# Patient Record
Sex: Male | Born: 1960 | Race: White | Hispanic: No | Marital: Married | Smoking: Never smoker
Health system: Southern US, Community
[De-identification: ages and names within clinical notes are randomized; demographics above are authoritative.]

## PROBLEM LIST (undated history)

## (undated) DIAGNOSIS — M1990 Unspecified osteoarthritis, unspecified site: Secondary | ICD-10-CM

## (undated) DIAGNOSIS — R011 Cardiac murmur, unspecified: Secondary | ICD-10-CM

## (undated) DIAGNOSIS — E785 Hyperlipidemia, unspecified: Secondary | ICD-10-CM

## (undated) DIAGNOSIS — K219 Gastro-esophageal reflux disease without esophagitis: Secondary | ICD-10-CM

## (undated) DIAGNOSIS — I10 Essential (primary) hypertension: Secondary | ICD-10-CM

## (undated) DIAGNOSIS — I509 Heart failure, unspecified: Secondary | ICD-10-CM

## (undated) DIAGNOSIS — I6521 Occlusion and stenosis of right carotid artery: Secondary | ICD-10-CM

## (undated) DIAGNOSIS — N529 Male erectile dysfunction, unspecified: Secondary | ICD-10-CM

## (undated) DIAGNOSIS — I421 Obstructive hypertrophic cardiomyopathy: Secondary | ICD-10-CM

## (undated) HISTORY — DX: Male erectile dysfunction, unspecified: N52.9

## (undated) HISTORY — DX: Hyperlipidemia, unspecified: E78.5

## (undated) HISTORY — DX: Gastro-esophageal reflux disease without esophagitis: K21.9

## (undated) HISTORY — DX: Obstructive hypertrophic cardiomyopathy: I42.1

## (undated) HISTORY — DX: Essential (primary) hypertension: I10

---

## 2006-04-13 ENCOUNTER — Inpatient Hospital Stay (HOSPITAL_COMMUNITY): Admission: AD | Admit: 2006-04-13 | Discharge: 2006-04-14 | Payer: Self-pay | Admitting: Internal Medicine

## 2006-04-13 ENCOUNTER — Ambulatory Visit: Payer: Self-pay | Admitting: *Deleted

## 2006-04-14 ENCOUNTER — Encounter: Payer: Self-pay | Admitting: Internal Medicine

## 2006-04-14 HISTORY — PX: CARDIAC CATHETERIZATION: SHX172

## 2006-04-17 ENCOUNTER — Ambulatory Visit: Payer: Self-pay | Admitting: Internal Medicine

## 2006-04-22 HISTORY — PX: MYOMECTOMY: SHX85

## 2006-04-23 ENCOUNTER — Ambulatory Visit (HOSPITAL_COMMUNITY): Admission: RE | Admit: 2006-04-23 | Discharge: 2006-04-23 | Payer: Self-pay | Admitting: Internal Medicine

## 2006-04-23 ENCOUNTER — Ambulatory Visit: Payer: Self-pay | Admitting: Internal Medicine

## 2006-05-02 ENCOUNTER — Ambulatory Visit: Payer: Self-pay | Admitting: Internal Medicine

## 2006-05-15 ENCOUNTER — Ambulatory Visit: Payer: Self-pay | Admitting: Internal Medicine

## 2006-05-29 ENCOUNTER — Ambulatory Visit: Payer: Self-pay | Admitting: Cardiology

## 2006-06-02 ENCOUNTER — Ambulatory Visit: Payer: Self-pay | Admitting: Cardiology

## 2006-06-10 ENCOUNTER — Ambulatory Visit: Payer: Self-pay | Admitting: Cardiology

## 2006-06-13 ENCOUNTER — Ambulatory Visit: Payer: Self-pay | Admitting: Internal Medicine

## 2006-06-13 LAB — CONVERTED CEMR LAB
Calcium: 8.8 mg/dL (ref 8.4–10.5)
GFR calc Af Amer: 104 mL/min
GFR calc non Af Amer: 86 mL/min
Glucose, Bld: 105 mg/dL — ABNORMAL HIGH (ref 70–99)
Sodium: 144 meq/L (ref 135–145)

## 2006-06-18 ENCOUNTER — Ambulatory Visit: Payer: Self-pay | Admitting: Internal Medicine

## 2006-07-04 ENCOUNTER — Ambulatory Visit: Payer: Self-pay | Admitting: Internal Medicine

## 2006-07-28 ENCOUNTER — Encounter: Payer: Self-pay | Admitting: Cardiovascular Disease

## 2006-07-28 ENCOUNTER — Ambulatory Visit: Payer: Self-pay

## 2006-10-10 ENCOUNTER — Ambulatory Visit: Payer: Self-pay | Admitting: Internal Medicine

## 2007-04-07 ENCOUNTER — Ambulatory Visit: Payer: Self-pay | Admitting: Internal Medicine

## 2008-03-18 ENCOUNTER — Ambulatory Visit: Payer: Self-pay | Admitting: Internal Medicine

## 2008-04-20 ENCOUNTER — Ambulatory Visit: Payer: Self-pay

## 2008-04-20 ENCOUNTER — Encounter: Payer: Self-pay | Admitting: Internal Medicine

## 2008-10-17 ENCOUNTER — Telehealth: Payer: Self-pay | Admitting: Internal Medicine

## 2009-05-29 ENCOUNTER — Ambulatory Visit: Payer: Self-pay | Admitting: Cardiology

## 2009-05-29 ENCOUNTER — Encounter: Payer: Self-pay | Admitting: Cardiology

## 2009-05-30 ENCOUNTER — Encounter: Payer: Self-pay | Admitting: Internal Medicine

## 2009-05-30 ENCOUNTER — Encounter: Payer: Self-pay | Admitting: Cardiology

## 2009-05-31 ENCOUNTER — Telehealth (INDEPENDENT_AMBULATORY_CARE_PROVIDER_SITE_OTHER): Payer: Self-pay | Admitting: *Deleted

## 2009-06-21 DIAGNOSIS — K219 Gastro-esophageal reflux disease without esophagitis: Secondary | ICD-10-CM

## 2009-06-21 DIAGNOSIS — I10 Essential (primary) hypertension: Secondary | ICD-10-CM | POA: Insufficient documentation

## 2009-06-21 DIAGNOSIS — E785 Hyperlipidemia, unspecified: Secondary | ICD-10-CM

## 2009-06-22 ENCOUNTER — Ambulatory Visit: Payer: Self-pay | Admitting: Internal Medicine

## 2009-06-23 ENCOUNTER — Encounter: Payer: Self-pay | Admitting: Cardiology

## 2009-07-05 ENCOUNTER — Telehealth: Payer: Self-pay | Admitting: Internal Medicine

## 2010-02-21 ENCOUNTER — Ambulatory Visit: Payer: Self-pay | Admitting: Internal Medicine

## 2010-02-21 ENCOUNTER — Encounter: Payer: Self-pay | Admitting: Internal Medicine

## 2010-02-21 DIAGNOSIS — R5383 Other fatigue: Secondary | ICD-10-CM

## 2010-02-21 DIAGNOSIS — R5381 Other malaise: Secondary | ICD-10-CM

## 2010-02-23 ENCOUNTER — Telehealth: Payer: Self-pay | Admitting: Internal Medicine

## 2010-05-22 NOTE — Letter (Signed)
Summary: [facility]   Imported By: Roderic Ovens 06/19/2009 14:47:29  _____________________________________________________________________  External Attachment:    Type:   Image     Comment:   External Document

## 2010-05-22 NOTE — Progress Notes (Signed)
Summary: test results  Phone Note Call from Patient Call back at Home Phone [phone] Call back at [phone]   Caller: Patient Reason for Call: Lab or Test Results Initial call taken by: Judie Grieve,  February 23, 2010 1:28 PM  Follow-up for Phone Call        have not received lab results yet, pt states he had them drawn on Thur am advised it takes time for results to come back and then they have to fax them to [place], will call him when they come in [facility], RN  February 23, 2010 1:34 PM

## 2010-05-22 NOTE — Progress Notes (Signed)
Summary: Post Hosp Follow up appt  ---- Converted from flag ---- ---- 05/30/2009 4:13 PM, Loreli Slot, MD, [facility] wrote: Could you please help to schedule a visit for [name] [name] 01/20/1961 in the [place] office with Dr. Gala Romney over next few weeks.  We saw him in consult at [facility].  I sent a flag to [facility].  [place] office can call patient with visit.  Thanks. ------------------------------  Pt has been scheduled with Dr. Gala Romney on Thurs 3/3 at 3:45. Per Marcelino Duster in [place], she will notify pt of appt.

## 2010-05-22 NOTE — Progress Notes (Signed)
Summary: want lab results  Phone Note Call from Patient Call back at [phone]   Caller: Patient Reason for Call: Talk to Nurse, Talk to Doctor, Lab or Test Results Summary of Call: pt got lab work done and he would like results Initial call taken by: Omer Jack,  July 05, 2009 12:50 PM  Follow-up for Phone Call        gave pt lab results, he is aware Dr Gala Romney has not reviewed, he states he has not been taking his gemfibrizil he will restart, will call back if further recommendations Meredith Staggers, RN  July 05, 2009 3:47 PM

## 2010-05-22 NOTE — Assessment & Plan Note (Signed)
Summary: f69m   Visit Type:  6 mo f/u Primary Provider:  NO PCP AT THIS TIME  CC:  pt offers no cardiac complaints today.  History of Present Illness: [name] [name] is a very pleasant 50 year old male with history of hypertrophic obstructive cardiomyopathy status post septal myomectomy in January 2008 at [facility].  Followup echocardiogram shows an EF of 60% with a mild residual gradient of 2-3 meters per second across his LV outflow tract.  This has been stable.   He also has a history of mild mitral regurgitation, hypertension, hyperlipidemia with a low HDL.   Admitted to [facility] with CP in February 2011. Troponin equivocal at 0.06. Underwent Lexiscan/Myoview which showed:  Equivocal LV perfusion. Limitations and artifact were due to diaphragm or other soft tissue. There was a small, reversible defect in the mid inferolateral, apical lateral segment(s). Potentially related to variable soft tissue attenuation - less likely focal ischemia. The patient's calculated post stress LVEF was 52% (EF is underestimated)  TID Ratio 1.24 - could be function of subendocardial ischemia in setting of severe LVH.   Also had echo EF 65%with severe LVH no significant LVOT gradient   Back to work. No recurrent CP or dyspnea. No syncope. BP ok. Only real compliant is he remains tired. Working 70 hours a week and doesn't have the stamina he used to have. Does not have PCP - no blood tests recently. Having problems with intermittent erectile dysfunction.   Current Medications (verified): 1)  Gemfibrozil 600 Mg Tabs (Gemfibrozil) .... Take 1 Tablet By Mouth Twice A Day 2)  Hydrochlorothiazide 25 Mg Tabs (Hydrochlorothiazide) .... Take 1 Tablet By Mouth Once A Day 3)  Lisinopril 40 Mg Tabs (Lisinopril) .... Take 1 Tablet By Mouth Once A Day 4)  Metoprolol Succinate 100 Mg Xr24h-Tab (Metoprolol Succinate) .... Take 1 Tablet By Mouth Twice A Day 5)  Aspirin 81 Mg Tbec (Aspirin) .... Take One Tablet By Mouth Daily 6)   Omega-3 Krill Oil 300 Mg Caps (Krill Oil) .... 3 Caps Once Daily  Allergies (verified): No Known Drug Allergies  Past History:  Past Medical History: 1. CARDIOMYOPATHY, HYPERTROPHIC, OBSTRUCTIVE      --cardiac cath 2007. nomrmal cors 2. HYPERLIPIDEMIA   3. HYPERTENSION  4. GERD 5. Erectile dysfunction 6. Rheumatic fever as a child. 7. Chest pain   -Lexiscan Myoview 2/11:  Equivocal LV perfusion. Limitations and artifact were due to diaphragm or other soft tissue. There was a small, reversible defect in the mid inferolateral, apical lateral segment(s). Potentially related to variable soft tissue attenuation - less likely focal ischemia. The patient's calculated post stress LVEF was 52%. TID Ratio 1.24 - could be function of subendocardial ischemia in setting of severe LVH.   Review of Systems       As per HPI and past medical history; otherwise all systems negative.   Vital Signs:  Patient profile:   50 year old male Height:      63 inches Weight:      196 pounds BMI:     34.85 Pulse rate:   66 / minute Pulse rhythm:   irregular BP sitting:   112 / 75  (left arm) Cuff size:   large  Vitals Entered By: Danielle Rankin, CMA (February 21, 2010 11:39 AM)  Physical Exam  General:  Well appearing. no resp difficulty HEENT: normal Neck: supple. no JVD. Carotids 2+ bilat; no bruits. No lymphadenopathy or thryomegaly appreciated. Cor: PMI nondisplaced. Regular rate & rhythm. No rubs, gallops,  murmur. Lungs: clear Abdomen: soft, nontender, nondistended. No hepatosplenomegaly. No bruits or masses. Good bowel sounds. Extremities: no cyanosis, clubbing, rash, edema Neuro: alert & orientedx3, cranial nerves grossly intact. moves all 4 extremities w/o difficulty. affect pleasant    Impression & Recommendations:  Problem # 1:  CARDIOMYOPATHY, HYPERTROPHIC, OBSTRUCTIVE (ICD-425.1) Doing well. Stable. No LVOT gradient s/p myectomy.  Problem # 2:  HYPERLIPIDEMIA (ICD-272.4) Due for  lipid check. Encouraged him to get PCP.   Problem # 3:  HYPERTENSION (ICD-401.9) Blood pressure well controlled. Continue current regimen.  Problem # 4:  Erectile dysfunction Will continue as needed sildenafil.   Problem # 5:  FATIGUE / MALAISE (ICD-780.79) Likely due to amount of work he is doing and aging proces. Will check routine labs.   CHF Assessment/Plan:      The patient's current weight is 196 pounds.  His previous weight was 194 pounds.     Patient Instructions: 1)  Labs tom. 2)  Your physician wants you to follow-up in: 1 year.   You will receive a reminder letter in the mail two months in advance. If you don't receive a letter, please call our office to schedule the follow-up appointment. Prescriptions: VIAGRA 100 MG TABS (SILDENAFIL CITRATE) as needed  #10 x 6   Entered by:   Meredith Staggers, RN   Authorized by:   Dolores Patty, MD, [facility]   Signed by:   Meredith Staggers, RN on 02/21/2010   Method used:   Electronically to        [id number] * (retail)       [address]       [place], [place]  [address]       Ph: [phone]       Fax: [phone]   RxID:   [id number]

## 2010-05-22 NOTE — Assessment & Plan Note (Signed)
Summary: EPH   Visit Type:  Follow-up Primary Provider:  none  CC:  eph.  History of Present Illness: [name] [name] is a very pleasant 50 year old male with history of hypertrophic obstructive cardiomyopathy status post septal myomectomy in January 2008 at [facility].  Followup echocardiogram shows an EF of 60% with a mild residual gradient of 2-3 meters per second across his LV outflow tract.  This has been stable.   He also has a history of mild mitral regurgitation, hypertension, hyperlipidemia with a low HDL.   Last month was admitted to [facility] with CP. Troponin equivocal at 0.06. Underwent Lexiscan/Myoview which showed:  Equivocal LV perfusion. Limitations and artifact were due to diaphragm or other soft tissue. There was a small, reversible defect in the mid inferolateral, apical lateral segment(s). Potentially related to variable soft tissue attenuation - less likely focal ischemia. The patient's calculated post stress LVEF was 52% (EF is underestimated)  TID Ratio 1.24 - could be function of subendocardial ischemia in setting of severe LVH.   Also had echo ef 65%with severe LVH no significant LVOT gradient   Back to work. No recurrent CP or dyspnea. No syncope. BP ok.   Current Medications (verified): 1)  Gemfibrozil 600 Mg Tabs (Gemfibrozil) .... Take 1 Tablet By Mouth Twice A Day 2)  Hydrochlorothiazide 25 Mg Tabs (Hydrochlorothiazide) .... Take 1 Tablet By Mouth Once A Day 3)  Lisinopril 40 Mg Tabs (Lisinopril) .... Take 1 Tablet By Mouth Once A Day 4)  Metoprolol Succinate 100 Mg Xr24h-Tab (Metoprolol Succinate) .... Take 1 Tablet By Mouth Twice A Day 5)  Aspirin 81 Mg Tbec (Aspirin) .... Take One Tablet By Mouth Daily 6)  Fish Oil   Oil (Fish Oil) .... 1000mg  2 Caps Once Daily  Allergies (verified): No Known Drug Allergies  Past History:  Past Medical History: 1. CARDIOMYOPATHY, HYPERTROPHIC, OBSTRUCTIVE   2. HYPERLIPIDEMIA   3. HYPERTENSION  4. GERD 5. Erectile  dysfunction 6. Rheumatic fever as a child. 7. Chest pain   -Lexiscan Myoview 2/11:  Equivocal LV perfusion. Limitations and artifact were due to diaphragm or other soft tissue. There was a small, reversible defect in the mid inferolateral, apical lateral segment(s). Potentially related to variable soft tissue attenuation - less likely focal ischemia. The patient's calculated post stress LVEF was 52%. TID Ratio 1.24 - could be function of subendocardial ischemia in setting of severe LVH.   Vital Signs:  Patient profile:   50 year old male Height:      63 inches Weight:      194 pounds BMI:     34.49 Pulse rate:   61 / minute BP sitting:   120 / 72  (left arm) Cuff size:   regular  Vitals Entered By: Hardin Negus, RMA (June 22, 2009 3:51 PM)   Impression & Recommendations:  Problem # 1:  CHEST TIGHTNESS-PRESSURE-OTHER ([id number]) Resolved. Myoview reassuring. No further cardiac work-up at this time.   Problem # 2:  CARDIOMYOPATHY, HYPERTROPHIC, OBSTRUCTIVE (ICD-425.1) Stable. No LVOT gradient s/p myectomy.  Problem # 3:  HYPERLIPIDEMIA (ICD-272.4) Due for lipid and liver recheck. Continue current medications.  Other Orders: EKG w/ Interpretation (93000)  Appended Document: EPH    Clinical Lists Changes  Medications: Rx of HYDROCHLOROTHIAZIDE 25 MG TABS (HYDROCHLOROTHIAZIDE) Take 1 tablet by mouth once a day;  #90 x 3;  Signed;  Entered by: Meredith Staggers, RN;  Authorized by: Dolores Patty, MD, [facility];  Method used: Electronically to [facility]*, , ,   ,  Ph: [phone], Fax: [phone] Rx of LISINOPRIL 40 MG TABS (LISINOPRIL) Take 1 tablet by mouth once a day;  #90 x 3;  Signed;  Entered by: Meredith Staggers, RN;  Authorized by: Dolores Patty, MD, [facility];  Method used: Electronically to [facility]*, , ,   , Ph: [phone], Fax: [phone] Rx of METOPROLOL SUCCINATE 100 MG XR24H-TAB (METOPROLOL SUCCINATE) Take 1 tablet by mouth twice a day;  #180 x 3;  Signed;   Entered by: Meredith Staggers, RN;  Authorized by: Dolores Patty, MD, [facility];  Method used: Electronically to [facility] [place]*, , ,   , Ph: [phone], Fax: [phone]    Prescriptions: METOPROLOL SUCCINATE 100 MG XR24H-TAB (METOPROLOL SUCCINATE) Take 1 tablet by mouth twice a day  #180 x 3   Entered by:   Meredith Staggers, RN   Authorized by:   Dolores Patty, MD, [facility]   Signed by:   Meredith Staggers, RN on 06/22/2009   Method used:   Electronically to        [facility]* (mail-order)             ,          Ph: [phone]       Fax: [phone]   RxID:   [id number] LISINOPRIL 40 MG TABS (LISINOPRIL) Take 1 tablet by mouth once a day  #90 x 3   Entered by:   Meredith Staggers, RN   Authorized by:   Dolores Patty, MD, [facility]   Signed by:   Meredith Staggers, RN on 06/22/2009   Method used:   Electronically to        [facility]* (mail-order)             ,          Ph: [phone]       Fax: [phone]   RxID:   [id number] HYDROCHLOROTHIAZIDE 25 MG TABS (HYDROCHLOROTHIAZIDE) Take 1 tablet by mouth once a day  #90 x 3   Entered by:   Meredith Staggers, RN   Authorized by:   Dolores Patty, MD, [facility]   Signed by:   Meredith Staggers, RN on 06/22/2009   Method used:   Electronically to        [facility]* (mail-order)             ,          Ph: [phone]       Fax: [phone]   RxID:   [id number]

## 2010-05-22 NOTE — Consult Note (Signed)
Summary: Consultation Report/ CARDIOLOGY [facility]  Consultation Report/ CARDIOLOGY [facility]   Imported By: Dorise Hiss 05/31/2009 10:56:59  _____________________________________________________________________  External Attachment:    Type:   Image     Comment:   External Document

## 2010-06-26 ENCOUNTER — Encounter: Payer: Self-pay | Admitting: Physician Assistant

## 2010-06-27 DIAGNOSIS — R7989 Other specified abnormal findings of blood chemistry: Secondary | ICD-10-CM

## 2010-06-27 DIAGNOSIS — R55 Syncope and collapse: Secondary | ICD-10-CM

## 2010-06-27 DIAGNOSIS — R079 Chest pain, unspecified: Secondary | ICD-10-CM

## 2010-07-11 ENCOUNTER — Encounter: Payer: Self-pay | Admitting: Physician Assistant

## 2010-07-18 ENCOUNTER — Encounter: Payer: Self-pay | Admitting: Physician Assistant

## 2010-07-19 ENCOUNTER — Encounter: Payer: Self-pay | Admitting: Physician Assistant

## 2010-07-25 ENCOUNTER — Encounter: Payer: Self-pay | Admitting: Physician Assistant

## 2010-08-01 ENCOUNTER — Ambulatory Visit (INDEPENDENT_AMBULATORY_CARE_PROVIDER_SITE_OTHER): Payer: BC Managed Care – PPO | Admitting: Physician Assistant

## 2010-08-01 ENCOUNTER — Encounter: Payer: Self-pay | Admitting: Physician Assistant

## 2010-08-01 DIAGNOSIS — R55 Syncope and collapse: Secondary | ICD-10-CM

## 2010-08-01 DIAGNOSIS — E785 Hyperlipidemia, unspecified: Secondary | ICD-10-CM

## 2010-08-01 DIAGNOSIS — I421 Obstructive hypertrophic cardiomyopathy: Secondary | ICD-10-CM

## 2010-08-01 NOTE — Progress Notes (Signed)
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 male with a history of hypertrophic obstructive cardiomyopathy status post septal myomectomy in January 2008 at [facility].  Followup echocardiogram shows an EF of 60% with a mild residual gradient of 2-3 meters per second across his LV outflow tract.  He also has a history of mild mitral regurgitation, hypertension, hyperlipidemia with a low HDL. He was admitted to [facility] in early March with syncope and atypical chest discomfort.  He saw one of our cardiologist there.  He had mildly elevated troponins.  This was likely related to strain from his LVH.  His CK-MB's were negative.  He had an echocardiogram that demonstrated moderate to severe LVH and EF of 65%.  His resting LVOT peak velocity was calculated at 2.21 m/s.  A Myoview study was performed.  This demonstrated a small, fixed defect in the apical septal segment.  There was a small, reversible defect in the mid anterolateral, apical lateral segments.  Findings were similar to a prior study in 05/2009.  Mild apical lateral ischemia could not be ruled out and his EF was 53%.  He has done well since that time.  He denies chest pain.  He denies shortness of breath.  He denies further syncope.  He denies orthopnea, PND or pedal edema.  He had a syncopal episode in the context of a viral gastroenteritis.  He had several bouts of nausea vomiting and diarrhea preceding his syncope.  He denies any further episodes of syncope.  Past Medical History  Diagnosis Date  . HOCM (hypertrophic obstructive cardiomyopathy)      --cardiac cath 2007. nomrmal cors;  s/p myomectomy 2008;  Echo 3/12: mod to severe LVH, HCM, EF 65%, resting LVOT peak 2.21 m/s, mild LAE, mild MR, AV mean 9  . Hyperlipidemia   . GERD (gastroesophageal reflux disease)   . Erectile dysfunction   . Chest pain      -Lexiscan Myoview 3/12: ? small apical lateral ischemia, EF 53%; similar  to study in 2/11  . HTN (hypertension)     Current Outpatient Prescriptions  Medication Sig Dispense Refill  . aspirin 81 MG tablet Take 81 mg by mouth daily.        Marland Kitchen gemfibrozil (LOPID) 600 MG tablet Take 600 mg by mouth daily.        . hydrochlorothiazide 25 MG tablet Take 25 mg by mouth daily.        Marland Kitchen lisinopril (PRINIVIL,ZESTRIL) 40 MG tablet Take 40 mg by mouth daily.        . metoprolol (TOPROL-XL) 100 MG 24 hr tablet Take 2 tabs daily      . sildenafil (VIAGRA) 100 MG tablet Take 100 mg by mouth daily as needed.        Marland Kitchen DISCONTD: metoprolol (TOPROL-XL) 100 MG 24 hr tablet Take two tabs daily        No Known Allergies  Vital Signs: BP 140/90  Pulse 50  Ht 5\' 3"  (1.6 m)  Wt 191 lb (86.637 kg)  BMI 33.83 kg/m2  PHYSICAL EXAM: Well nourished, well developed, in no acute distress HEENT: normal Neck: no JVD Cardiac:  normal S1, S2; RRR; no murmur Lungs:  clear to auscultation bilaterally, no wheezing, rhonchi or rales Abd: soft, nontender, no hepatomegaly Ext: no edema Skin: warm and dry Neuro:  CNs 2-12 intact, no focal abnormalities noted  EKG:  Sinus bradycardia, heart rate 50, LVH with strain  ASSESSMENT AND PLAN:

## 2010-08-01 NOTE — Assessment & Plan Note (Signed)
He brings in a copy of his labs recently drawn by his primary care provider.  Total cholesterol is 182, triglycerides 69 HDL 31 and LDL 137.

## 2010-08-01 NOTE — Assessment & Plan Note (Signed)
Somewhat elevated today.  He can followup with his PCP.

## 2010-08-01 NOTE — Assessment & Plan Note (Addendum)
Stable measurements by recent echocardiogram.  His Myoview study was also unchanged.  He likely has subendocardial ischemia from significant strain from his LVH.  He was also seen by Dr. Gala Romney today.  He had normal cardiac catheterization in 2007.  We've planned no further workup at this time.  He can followup with Dr. Gala Romney in 6 months.

## 2010-08-01 NOTE — Patient Instructions (Signed)
Your physician recommends that you schedule a follow-up appointment in: 6 MONTHS WITH DR. Gala Romney

## 2010-08-01 NOTE — Assessment & Plan Note (Signed)
This was related to his illness and dehydration.  With his HOCM, he is prone to syncope Should he become slightly dehydrated.  He has been advised to continue to try to hydrate himself in the setting of any illness.

## 2010-08-21 ENCOUNTER — Encounter: Payer: Self-pay | Admitting: Internal Medicine

## 2010-08-21 ENCOUNTER — Encounter: Payer: Self-pay | Admitting: Physician Assistant

## 2010-09-04 NOTE — Assessment & Plan Note (Signed)
[facility]                            CARDIOLOGY OFFICE NOTE   NAME:[name] [name]                   MRN:          [id number]  DATE:03/18/2008                            DOB:          09-28-1960    INTERVAL HISTORY:  [name] [name] is a very pleasant 50 year old male with  history of hypertrophic obstructive cardiomyopathy status post septal  myomectomy in January 2008 at [facility].  Followup echocardiogram shows an EF  of 60% with a mild residual gradient of 2-3 meters per second across his  LV outflow tract.  This has been stable.   He also has a history of mild mitral regurgitation, hypertension,  hyperlipidemia with a low HDL.   From a functional point of view, he is doing great.  Continues to work  hard with no dyspnea, chest pain or other problems.  Denies any  orthopnea, no PND, no palpitations, no syncope or presyncope.   CURRENT MEDICATIONS:  1. Metoprolol ER 100 b.i.d.  2. Lisinopril 40 a day.  3. Aspirin 81 a day.  4. Hydrochlorothiazide 25 a day.  5. Fish oil.  6. Lopid 600 mg b.i.d.   PHYSICAL EXAMINATION:  GENERAL:  He is well-appearing in no acute  distress, ambulates around the clinic without any respiratory  difficulty.  VITAL SIGNS:  Blood pressure is 124/82, heart rate is 61, and weight is  195.  HEENT:  Normal.  NECK:  Supple.  No JVD.  Carotids are 2+ bilaterally without any bruits.  There is no lymphadenopathy or thyromegaly.  CARDIAC:  PMI is nondisplaced.  Regular rate and rhythm with a 2/6  systolic ejection murmur across the precordium.  I do not detect any  notable increase with Valsalva.  LUNGS:  Clear.  ABDOMEN:  Soft, nontender, nondistended.  No hepatosplenomegaly, no  bruits, no masses.  Good bowel sounds.  EXTREMITIES:  Warm with no  cyanosis, clubbing, or edema.  SKIN:  No rash.  NEURO:  Alert and oriented x3.  Cranial nerves II through XII are  intact.  Moves all 4 extremities without difficulty.  Affect is  pleasant.   EKG shows normal sinus rhythm at a rate of 61 with LVH and diffuse  repolarization abnormalities.   ASSESSMENT AND PLAN:  1. Hypertrophic obstructive cardiomyopathy status post myomectomy.  He      is doing well.  Continue current therapy.  2. Hypertension, well controlled.  3. Hyperlipidemia.  It has been followed by his primary care      physician.  I have asked to get a copy of his lipids.     [name] Buckles. Bensimhon, MD  Electronically Signed    DRB/MedQ  DD: 03/18/2008  DT: 03/18/2008  Job #: [id number]

## 2010-09-04 NOTE — Assessment & Plan Note (Signed)
[facility]                            CARDIOLOGY OFFICE NOTE   NAME:Garlick, DEE MADAY                   MRN:          [id number]  DATE:10/10/2006                            DOB:          24-Jun-1960    INTERVAL HISTORY:  Mr. [name] [name] is a pleasant 50 year old male with a  history of hypertrophic obstructive cardiomyopathy status post septal  myomectomy in January 2008 by Dr. Gasper Lloyd at [facility].  He returns today  for routine followup.   PAST MEDICAL HISTORY:  Also notable for hypertension and hyperlipidemia  with a low HDL.  He returns today with his family for routine followup.   He is doing great.  He is back to work.  He feels like he has more  energy than he has had in a long time.  Denies any chest pain, shortness  of breath, no syncope.  No palpitations.   CURRENT MEDICATIONS:  1. Metoprolol ER 100 mg b.i.d.  2. Lisinopril 20 a day.  3. Aspirin 81 a day.  4. Hydrochlorothiazide 25 a day.  5. Fish oil.   PHYSICAL EXAM:  He is well-appearing.  In no acute distress.  Ambulates  around the clinic without any respiratory difficulty.  Blood pressure 108/66, heart rate 66, weight 188.  HEENT:  Normal.  NECK:  Supple.  No JVD.  Carotids 2+ bilaterally without any bruits.  There is no lymphadenopathy or thyromegaly.  CARDIAC:  PMI is not displaced.  He has a regular rate and rhythm.  He  has a 2/6 systolic ejection murmur across the precordium, which does not  get appreciably worse with Valsalva.  LUNGS:  Clear.  ABDOMEN:  Soft and nontender.  Nondistended.  No hepatosplenomegaly.  No  bruits.  No masses.  Good bowel sounds.  EXTREMITIES:  Warm with no cyanosis, clubbing, or edema.  No rash.  NEURO:  He is alert and oriented x3.  Cranial nerves 2-12 are intact.  Moves all 4 extremities without difficulty.   ASSESSMENT AND PLAN:  1. Hypertrophic obstructive cardiomyopathy.  He is symptomatically      much improved.  Continue current  therapy.  2. Hypertension.  Blood pressure is well controlled.  Continue current      therapy.  3. Hyperlipidemia.  He is on fish oil and we are following his lipids.      He may benefit from Niaspan in the future.  4. Family screening.  We will get the rest of his family set up for      screening echocardiograms to rule out HOCM.     Bevelyn Buckles. Bensimhon, MD  Electronically Signed    DRB/MedQ  DD: 10/10/2006  DT: 10/10/2006  Job #: [id number]

## 2010-09-04 NOTE — Assessment & Plan Note (Signed)
[facility]                            CARDIOLOGY OFFICE NOTE   NAME:[name] [name], [name] [name]                   MRN:          [id number]  DATE:04/07/2007                            DOB:          May 26, 1960    INTERVAL HISTORY:  [name] [name] is a very pleasant 50 year old male with  a history of hypertrophic obstructive cardiomyopathy status post septal  myomectomy in January of 2008, by Dr. Gasper Lloyd at [facility]; he returns  today for routine followup.  He has had a followup echocardiogram on  April, 2008, which showed an EF of 60% with just a mild residual  gradient across his LV outflow tract between 2 and 3 meters per second.  He also has mild mitral regurgitation, hypertension and hyperlipidemia  with a low HDL.   Overall, he is going great.  He is back to work.  His exercise tolerance  is good.  He denies any chest pain, shortness of breath, syncope, lower  extremity edema or palpitations.   He is having problems with erectile dysfunction and asking about Viagra.   CURRENT MEDICATIONS:  1. Metoprolol ER 100 b.i.d.  2. Lisinopril 40 a day.  3. Aspirin 81.  4. HCTZ 25 a day.  5. Fish Oil.  6. Lopid 600 b.i.d.   PHYSICAL EXAM:  He is well-appearing, in no acute distress.  He  ambulates around the clinic without any respiratory difficulty.  Blood  pressure is 112/68, heart rate 60, weight is 195.  HEENT:  Normal.  NECK:  Supple.  There is no JVD.  Carotids are 2+ bilaterally without  any bruits.  There is no lymphadenopathy or thyromegaly.  CARDIAC:  PMI is nondisplaced.  He has a regular rate and rhythm with a  2/6 systolic ejection murmur across the precordium.  It gets only  minimally worse with Valsalva.  LUNGS:  Clear.  ABDOMEN:  Soft, nontender, nondistended.  No hepatosplenomegaly, no  bruits, no masses, good bowel sounds.  EXTREMITIES:  Warm with no cyanosis, clubbing or edema, no rash.  NEURO:  He is alert and oriented x3.  Cranial nerves  II-XII are intact.  Moves all 4 extremities without any difficulty.  Affect is pleasant.   ASSESSMENT AND PLAN:  1. Hypertrophic obstructive cardiomyopathy status post myomectomy.  He      is doing well.  Continue current therapy.  2. Hypertension, well controlled.  3. Hyperlipidemia.  He is due for a recheck of his lipids.  4. Erectile dysfunction.  Will try him on Viagra.  I discussed with      him the risks of hypotension and the fact that he is not to take      with nitroglycerin.  He understands these risks and will give him a      prescription.   DISPOSITION:  Return to clinic in 6 months.     Bevelyn Buckles. Bensimhon, MD  Electronically Signed    DRB/MedQ  DD: 04/07/2007  DT: 04/08/2007  Job #: [id number]

## 2010-09-07 NOTE — Assessment & Plan Note (Signed)
[facility]                            CARDIOLOGY OFFICE NOTE   NAME:[name] [name], [name] [name]                     MRN:          [id number]  DATE:05/29/2006                            DOB:          [birth year]    CARDIOLOGIST:  Bevelyn Buckles. Bensimhon, M.D.   HISTORY OF PRESENT ILLNESS:  Mr. Munford is a 50 year old male patient  followed by Dr. Gala Romney with history of hypertrophic cardiomyopathy  who recently underwent myomectomy at [facility].  He is in the Coumadin clinic  today for followup on his Coumadin.  He also has a left upper extremity  DVT.  I was called back to take a look at a rash under his left axilla.  He denies any fevers or chills.   PHYSICAL EXAMINATION:  He is a well-nourished, well-developed male in no  acute distress.  Under his left axilla is a moderate-sized plaque.  There is no clearing centrally.  This does not look like tinea.  He has  some mild plaquing under his right axilla with some linear streaking.  There is a mild amount of excoriation.   ASSESSMENT AND PLAN:  Rash.  The lesion under his left axilla and right  axilla both appear to be an allergic-type reaction.  He just got out of  [facility] a couple of days ago after myomectomy.  I imagine there was some  sort of patch that was placed in this area during his surgery and  hospitalization that he reacted to.  I have recommended over-the-counter  hydrocortisone cream twice a day.  He is to try to keep the area dry and  clean.  He has no primary care physician.  I have asked him to go ahead  and try to establish with someone and follow up with them if the rash is  not getting any better.  He will let the Coumadin Clinic staff know on  Monday of next week when he returns for followup how the rash is doing.  Of course, if it gets worse or he develops fevers or chills, he should  go see his new primary care physician or let us know.      Tereso Newcomer, PA-C  Electronically  Signed      Jesse Sans. Daleen Squibb, MD, [facility]  Electronically Signed   SW/MedQ  DD: 05/29/2006  DT: 05/29/2006  Job #: [id number]

## 2010-09-07 NOTE — Assessment & Plan Note (Signed)
[facility]                            CARDIOLOGY OFFICE NOTE   NAME:[name] [name] CHRISTOFFERSEN                   MRN:          [id number]  DATE:07/04/2006                            DOB:          15-Apr-1961    INTERVAL HISTORY:  [name] [name] is a pleasant 50 year old male with a  history of hypertrophic obstructive cardiomyopathy, status post septal  myomectomy on May 21, 2006, by Dr. Gasper Lloyd at [facility].  He returns  today for routine followup.  Past medical history also includes  hypertension and hyperlipidemia with a low HDL.  Postoperatively, he  also had a left upper extremity DVT, which was treated with a short  course of Coumadin.  Today, he says he feels okay.  He denies any chest  pain or shortness of breath.  He seems to be getting back to his routine  activities.  His wife does say that when he eats she notes that he has  extreme perioral cyanosis.  She says that it is better than it used to  be but still concerns her.  He denies any orthopnea, PND, no lower  extremity edema.   CURRENT MEDICATIONS:  1. Metoprolol ER 100 b.i.d.  2. Lisinopril 20 a day.  3. Aspirin 81 a day.  4. Hydrochlorothiazide 25 a day.  5. Fish oil.   PHYSICAL EXAMINATION:  GENERAL:  He is well-appearing, no acute  distress.  He ambulates around the clinic without any respiratory  difficulty.  On ambulating he does not drop his oxygen saturations.  VITAL SIGNS:  Blood pressure is 146/90 with a heart rate of 57.  Weight  is 192.  HEENT:  Sclerae anicteric.  EOMI.  There is no xanthelasma.  Mucous  membranes are moist.  NECK:  Supple.  There is no JVD.  Carotids are 2+ bilaterally without  any bruits.  There is no lymphadenopathy or thyromegaly.  CARDIAC:  He is bradycardic and regular.  I do not hear any murmur  across his precordium.  His sternum appears to be quite stable with a  well-healing incision or healed incision.  LUNGS:  Clear.  ABDOMEN:  Soft,  nontender, nondistended.  No hepatosplenomegaly.  No  bruits.  No masses.  Good bowel sounds.  EXTREMITIES:  Warm with no cyanosis, clubbing, or edema.  NEUROLOGIC:  He is alert and oriented x3.  Cranial nerves II-XII are  intact.  He moves all four extremities without difficulty.   ASSESSMENT/PLAN:  1. Hypertrophic obstructive cardiomyopathy.  Symptoms are much      improved.  He is not quite back to baseline yet.  I have asked him      to go to cardiac rehab.  He will be out of work for six more weeks      and then we will try to get him back to his job.  2. Hypertension.  Still elevated.  Increased Lisinopril to 40 once a      day and we will check his electrolytes next week.  3. Hyperlipidemia.  He is on fish oil and we will repeat his lipids  next week.   DISPOSITION:  Return to the clinic in 2 months and at that time we will  get an echocardiogram to evaluate him postoperatively.     [name] Buckles. Bensimhon, MD  Electronically Signed    DRB/MedQ  DD: 07/04/2006  DT: 07/05/2006  Job #: [id number]

## 2010-09-07 NOTE — Assessment & Plan Note (Signed)
[facility]                            CARDIOLOGY OFFICE NOTE   NAME:[name] [name], [name] [name]                   MRN:          [id number]  DATE:04/17/2006                            DOB:          07-24-1960    PATIENT IDENTIFICATION:  [name] [name] is a 50 year old male with  recently diagnosed hypertrophic obstructive cardiomyopathy, who returns  for posthospital followup.   PROBLEM LIST:  1. Hypertrophic obstructive cardiomyopathy.      a.     Echocardiogram April 14, 2006, ejection fraction 60% with       severe concentric left ventricular hypertrophy with wall thickness       of 2.2 cm.  There was dynamic left ventricular outflow tract       bstruction of 160 mmHg.  There was mild mitral regurgitation.  2. Congestive heart failure secondary to #1.  3. Hypertension.  4. History of rheumatic fever as a child.   CURRENT MEDICATIONS:  1. Toprol XL 100 mg daily.  2. Aspirin 81 mg daily.   ALLERGIES:  No known drug allergies.   INTERVAL HISTORY:  [name] [name] was admitted to [facility] several days  ago for congestive heart failure symptoms.  He underwent cardiac  catheterization which showed nonobstructive coronary artery disease by  almost a 90 mm gradient across his outflow track.  He then underwent  echocardiogram when he was more tachycardic, and this showed severe  concentric LVH of significant obstruction with an LV outflow track  greater than about 160 mmHg.  He was started on Toprol and discharged  home with planned followup in hypertrophic cardiomyopathy clinic at  [facility].  However, the day after discharge, he noticed that after drinking  a large cup of coffee and multiple pieces of chocolate, he became quite  short of breath, and there was some perioral cyanosis.  He presented to  [facility].  He once again ruled out for myocardial infarction.  The case was discussed with me, and we discharged him home.  He comes  back today for  followup.  He has started his Toprol.  He feels much  better without any significant complaints.   PHYSICAL EXAMINATION:  GENERAL:  He is well-appearing, in no acute  distress.  VITAL SIGNS:  Blood pressure is 145/99.  Heart rate 76.  Weight is 174.  HEENT:  Sclerae anicteric.  EOMI.  There is no xanthelasma.  Mucous  membranes are moist.  NECK:  Supple.  There is no JVD.  Carotids are 2+ bilaterally without  bilateral radiated bruits.  There is no lymphadenopathy or thyromegaly.  CARDIAC:  He has a 2/6 systolic ejection murmur across the LV outflow  tract which increases with Valsalva.  Also soft mitral regurgitation  murmur.  LUNGS:  Clear.  ABDOMEN:  Soft, nontender, nondistended.  No hepatosplenomegaly.  No  bruits, no masses.  EXTREMITIES:  Warm with no cyanosis, clubbing, or edema.  Good distal  pulses.  NEUROLOGIC:  Alert and oriented x3.  Cranial nerves 2-12 are intact and  moves all 4 extremities without difficulty.   EKG shows normal sinus rhythm at a  rate of 72.  There is LVH with  repolarization abnormalities and biatrial enlargement.  QT is mildly  prolonged with a QTc of 514 msec.   ASSESSMENT/PLAN:  Hypertrophic obstructive cardiomyopathy with  concentric left ventricular hypertrophy and severe outflow tract  gradient.  He is quite symptomatic with NYHA Class III symptoms.  He  does not have any high risk features currently such as family history of  sudden cardiac death or syncope.  We are planning to proceed with CPX  testing, and we have him set up with an appointment to see Dr. Nolon Rod at [facility] hypertrophic cardiomyopathy clinic on January 4.  We had  planned to start him on disopyramide; however, given his QT  prolongation, I am a bit uncomfortable with this.  We will just increase  his Toprol to 200 mg once daily.  I will await Dr. Juanda Chance  recommendations on this as well as the degree of familial screening  needed.  He does have a 3 year old autistic son  who has an impressive  murmur who is already set up for an echocardiogram.  For completeness  sake, given his biventricular hypertrophy, we will also screen for  amyloid with a urine and serum protein electrophoresis.  We will see him  back in 2 weeks for followup.     Bevelyn Buckles. Bensimhon, MD  Electronically Signed    DRB/MedQ  DD: 04/17/2006  DT: 04/17/2006  Job #: [id number]   cc:   [facility] Nolon Rod, MD, [facility]

## 2010-09-07 NOTE — Assessment & Plan Note (Signed)
[facility]                            CARDIOLOGY OFFICE NOTE   NAME:[name] [name], [name] [name]                   MRN:          [id number]  DATE:05/15/2006                            DOB:          1960-04-26    PATIENT IDENTIFICATION:  Mr. Scherrie Gerlach is a 50 year old male with  hypertension and severe hypertrophic obstructive cardiomyopathy as well  as mitral regurgitation who returns for a routine followup.   He is scheduled to undergo septal myectomy with Dr. Gasper Lloyd next  week.  He says that he continues to be quite short of breath with almost  any activity and gets dizzy when he bends over.  He has not had any  frank syncope.  No chest pain.  Denies any lower extremity edema.  He  has had some orthopnea.   MEDICATIONS:  1. Metoprolol 100 mg a day.  2. Verapamil 120 a day.  3. Lisinopril 20 a day.  4. Aspirin 81 a day.   PHYSICAL EXAM:  He is able to walk across the clinic briskly without any  respiratory difficulty.  Blood pressure here is 150/92 with a heart rate of 68.  At home, his  blood pressure has been in the 120/130 range.  Weight is 182, which is  stable.  HEENT:  Sclerae anicteric.  EOMI.  There is no xanthelasma.  Mucous  membranes are moist.  NECK:  Supple.  No JVD.  Carotids are 2+ bilaterally with bilateral  radiated bruits.  CARDIAC:  He has a regular rate and rhythm with a 3/6 holosystolic  murmur at the right sternal border, which increases with Valsalva.  There is also a soft mitral regurgitation murmur.  LUNGS:  Clear.  ABDOMEN:  Soft, nontender, and nondistended.  No hepatosplenomegaly.  No  bruits.  No masses.  EXTREMITIES:  Warm with no cyanosis, clubbing, or edema.  NEUROLOGIC:  Alert and oriented x3.  Cranial nerves 2-12 are intact.  Moves all 4 extremities without difficulty.  Affect is pleasant.   ASSESSMENT AND PLAN:  1. Severe hypertrophic obstructive cardiomyopathy.  He is due for a      septal myectomy with Dr.  Gasper Lloyd next week.  Hopefully, this      will improve him greatly.  There does seem to be a bit of a      disconnect between his reported symptoms and how quickly he is able      to walk through the office here, so I do wonder if there may be a      component of anxiety overlying his symptoms.  However, given his      severe gradient, I do think he will benefit from surgical      treatment.  2. Hypertension.  This is much better controlled.  Continue current      therapy.   DISPOSITION:  We will see him back in 4 weeks for followup.     Bevelyn Buckles. Bensimhon, MD  Electronically Signed    DRB/MedQ  DD: 05/15/2006  DT: 05/15/2006  Job #: [id number]

## 2010-09-07 NOTE — Assessment & Plan Note (Signed)
[facility]                            CARDIOLOGY OFFICE NOTE   NAME:[name], [name] PASHA                   MRN:          [id number]  DATE:06/13/2006                            DOB:          05-Dec-1960    [name] [name] is a very pleasant 50 year old male with a history of  hypertrophic obstructive cardiomyopathy status post septal myomectomy on  May 21, 2006, by Dr. Gasper [name], at [facility].  He returns today for  postoperative followup.  He is doing very well and shortness of breath  is much improved, denies any chest pain.  He did have left upper  extremity DVT after his surgery and is now on Coumadin.  He saw Dr.  Silvestre [name] yesterday who said that he can stop his Coumadin after a month.  He is otherwise doing well.   PAST MEDICAL HISTORY:  Also notable for:  1. Hypertension.  2. Hyperlipidemia with an HDL of 26.   CURRENT MEDICATIONS:  1. Metoprolol ER 100 b.i.d.  2. Lisinopril 20.  3. Aspirin 81.  4. Hydrochlorothiazide 25.  5. Coumadin as directed.   PHYSICAL EXAM:  He is well appearing, no acute distress.  Ambulates  around the clinic without any respiratory difficulty.  Blood pressure is  112/60, heart rate 68, weight is 181.  HEENT:  Sclera anicteric, EOMI, there is no xanthelasmas.  Mucous  membranes are moist.  NECK:  Supple, no JVD.  Carotids are 2+ bilaterally, there is no  lymphadenopathy or thyromegaly.  CARDIAC:  Regular rate and rhythm with a soft 2/6 systolic ejection  murmur across the LV outflow tract.  This increases just slightly with  Valsalva, it is much better than it was previously.  LUNGS:  Clear.  ABDOMEN:  Soft, nontender, nondistended, no hepatosplenomegaly, no  bruits, no masses.  Good bowel sounds.  EXTREMITIES:  Warm with no clubbing, cyanosis or edema.  NEURO:  Alert and oriented x3.  Cranial nerves II-XII are intact, moves  all 4 extremities without difficulty.   ASSESSMENT AND PLAN:  1. Hypertrophic  obstructive cardiomyopathy; symptoms are much improved      after septal myomectomy Dr. Silvestre [name] and will continue current      therapy.  2. Hypertension.  Under much improved control.  He is on HCTZ and this      is new for him.  We will check a BMET to make sure his potassium is      okay.  3. Hyperlipidemia with a low HDL, I have asked him to start some Fish      Oil and we will recheck next month; I suspect he may need niacin.  4. Left upper extremity deep vein thrombosis perioperatively.  He will      finish a 1 month course of Coumadin as per Dr. Silvestre [name].   DISPOSITION:  Will see him back in 1 month for routine followup.     [name] Buckles. Bensimhon, MD  Electronically Signed    DRB/MedQ  DD: 06/13/2006  DT: 06/13/2006  Job #: [id number]

## 2010-09-07 NOTE — Assessment & Plan Note (Signed)
[facility]                            CARDIOLOGY OFFICE NOTE   NAME:Othman, [name] [name]                   MRN:          [id number]  DATE:05/02/2006                            DOB:          1960/10/22    PATIENT IDENTIFICATION:  Mr. Corsetti is a 50 year old male with history  of hypertrophic obstructive cardiomyopathy and mitral regurgitation as  well as severe hypertension, who returns to clinic today for routine  followup.   INTERVAL HISTORY:  Randon recently saw Dr. Carollee Herter in [facility]  at [facility]  He was referred to Dr. Silvestre Mesi for a possible myomectomy.  This  is scheduled for the end of January.  Matisse continues to be short of  breath with moderate activity, and occasionally feels lightheaded when  standing.  Also, occasional chest pain.  Denies any lower extremity  edema.  No significant palpitations.  No syncope.  He did recently wear  a 48-hour Holter monitor, and the results of this are pending.   CURRENT MEDICATIONS:  1. Metoprolol ER 100 mg a day.  2. Verapamil 120 mg.  3. Lisinopril 10.  4. Aspirin 81.   PHYSICAL EXAMINATION:  He is well appearing, in no acute distress.  Ambulates around the clinic without any respiratory difficulty.  Blood pressure is 181/110 when he is lying down, when he stands up blood  pressure goes to 170/111.  Heart is in the 50s.  HEENT:  Sclerae anicteric.  EOMI.  There is no xanthelasma.  Mucous  membranes are moist.  NECK:  Supple.  No JVD.  Carotids are 2+ bilaterally with bilateral  radiated bruits.  There is no lymphadenopathy or thyromegaly.  CARDIAC:  He is bradycardic and regular.  There is a very soft systolic  ejection murmur at the right sternal border which increases with  Valsalva.  There is a 2/6 holosystolic murmur at the apex.  LUNGS:  Clear.  ABDOMEN:  Soft, non-tender and non-distended.  No hepatosplenomegaly.  No bruits.  No masses.  Good bowel sounds.  EXTREMITIES:  Warm with  no cyanosis, clubbing or edema.  NEUROLOGIC:  He is alert and oriented x3.  Cranial nerves 2 through 12  are intact.  Moves all 4 extremities without difficulty.  Mildly  anxious.  EKG shows sinus bradycardia with LVH in dramatic repolarization  abnormality.  QT is mildly prolonged.  Heart rate is 50.  QTC interval  is 485 msec.   ASSESSMENT AND PLAN:  1. Hypertrophic obstructive cardiomyopathy.  Symptoms are somewhat      improved, but still with New York Heart Association class III      symptoms.  He is going to undergo surgical myomectomy with Dr. Aurora Mask at the end of this month.  Hopefully, that will help with him      symptomatically.  2. Hypertension.  Still poorly controlled.  Increase Lisinopril to 20.      We will see him back in 2 weeks for further titration.  We will      check a BMET next week.     Bevelyn Buckles.  Bensimhon, MD  Electronically Signed    DRB/MedQ  DD: 05/02/2006  DT: 05/02/2006  Job #: [id number]

## 2010-09-07 NOTE — Discharge Summary (Signed)
NAME:  [name] [name], [name] [name]            ACCOUNT NO.:  [id number]   MEDICAL RECORD NO.:  [id number]          PATIENT TYPE:  INP   LOCATION:  3702                         FACILITY:  [facility]   PHYSICIAN:  [name] [name], MDDATE OF BIRTH:  04-20-1961   DATE OF ADMISSION:  04/13/2006  DATE OF DISCHARGE:  04/14/2006                               DISCHARGE SUMMARY   PRIMARY CARE PHYSICIAN:  [name] [name].   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   SECONDARY DIAGNOSES:  1. Congestive heart failure secondary to #1.  2. Mild mitral regurgitation.   HISTORY OF PRESENT ILLNESS:  [name] [name] is a very pleasant 50 year old  male with a history of possible rheumatic fever as a child as well as  hypertension and gastroesophageal reflux disease.  In 2004, he had an  episode of chest pain and underwent workup in [place] including  catheterization, which showed normal coronary arteries.  Apparently, he  also underwent an echocardiogram and he was told he had a thickened left  ventricle related to [name] hypertension.  He was placed on  antihypertensive medications, but has been noncompliant with this.  Yesterday, he presented to [facility] complaining of a 1-year  history of progressive shortness of breath and exercise intolerance.  He  also had complaints of an upper tract infection with cough and yellowish  sputum.  At [facility], he underwent a chest CT which showed no  evidence of pulmonary embolus.  BNP came back elevated and there was  some concern over a mitral regurgitation murmur and congestive heart  failure and he was transferred to [facility] for further evaluation.  While in the hospital, he complained of some chest pain.  He was noted  to have a significant LV outflow tract murmur as well as mitral  regurgitation.  He was taken to the cardiac cath lab and underwent heart  catheterization which  showed low-normal LV function, although this was  hard to quantify, based on [name] significant ectopy.  Coronary arteries  were essentially normal with a left dominant system.  There was a mild  irregularity in [name] mid LAD.  RA pressure was mean of 6, RV 53/2, PA  pressure 44/19 with a mean of 30.  Pulmonary capillary wedge pressure  was mean of 16.  There was an apparent LV outflow tract gradient with  pressure in the distal ventricle of 196/5 with an EDP of 23 and in the  proximal ventricle of 113/1 with an EDP of 14 degrees.  Peak gradient  was 83.  Mean gradient was 49.  Fick cardiac output was 3.1 L/min,  cardiac index was 1.7 L/min per meter squared.  There was evidence of  possible mitral valve prolapse and 2+ mitral regurgitation.  He  underwent a subsequent echocardiogram which showed normal LV function  with an EF of 60%.  There was severe concentric hypertrophy with  posterior and septal wall thickness approximately 2.2 to 2.3 cm.  There  was a severe LV outflow tract gradient of approximately 150 mmHg with  significant anterior motion of the mitral  valve.  There was also mild  mitral regurgitation.   Post cath, he was doing well.  I had a long talk with [name] family about  the possible causes of hypertrophic cardiomyopathy.  I presume that this  is genetic and he will need family screening.  [name] [name] is a 50 year old  male with autism, but that also has significant systolic murmur on  physical exam.  While this is likely genetic, I do think we need to rule  out potentially treatable causes such amyloidosis.  We will perform this  screening as an outpatient.  We will also perform cardiopulmonary  exercise stress testing as an outpatient and refer him to [name] [name] at [facility] at [facility] for  further evaluation.   MEDICATIONS ON DISCHARGE:  1. Toprol-XL 100 mg a day.  2. Aspirin 81 mg a day.  Of note, he was not on any medications prior       to admission.  Discharge labs show a BNP of 1274, sodium of 136,      potassium 137, BUN 16, creatinine 1.4, albumin of 3.4.  White count      6.3, hemoglobin 14.4 and platelet count 196,000.  Total cholesterol      is 157, triglycerides 64, HDL 26 and LDL of 118.   FOLLOWUP ON DISCHARGE:  1. With me in 1-2 weeks.  2. Cardiopulmonary exercise testing; we will call him to schedule.  3. He will eventually follow up with [name] [name] in our [facility]      Cardiology office.  4. He will also be referred to [name] [name] at the Hypertrophic      Cardiomyopathy Clinic at [facility].  5. Follow up with [name] primary care physician, [name] [name].   I have told him that he is not to perform vigorous activity until  further evaluation.  We will help him apply for disability or job  retraining so that he has a more sedentary job.   TIME OF DISCHARGE ENCOUNTER:  Over 1 hour with discussions with the  patient and family.      [name] Buckles. Bensimhon, MD  Electronically Signed     DRB/MEDQ  D:  04/14/2006  T:  04/15/2006  Job:  [id number]   cc:   [name] Crocker MD

## 2010-09-07 NOTE — H&P (Signed)
NAME:  [name] [name], [name] [name]            ACCOUNT NO.:  [id number]   MEDICAL RECORD NO.:  [id number]          PATIENT TYPE:  INP   LOCATION:  3702                         FACILITY:  [facility]   PHYSICIAN:  Rod Holler, MD     DATE OF BIRTH:  05-08-1960   DATE OF ADMISSION:  04/13/2006  DATE OF DISCHARGE:                              HISTORY & PHYSICAL   CHIEF COMPLAINT:  Shortness of breath.   HISTORY OF PRESENT ILLNESS:  [name] [name] is a 50 year old male who has  a pertinent past medical history of possible rheumatic fever as a child  who presented to the emergency department at an outside hospital with  complaints of shortness of breath. Over the past week, his son was sick  and diagnosed with bronchitis. The patient started to have complaints of  myalgias over the past couple of days, along with shortness of breath,  cough productive of mild yellowish sputum. The patient has also felt  feverish. He has had no complaints of chest pain. More concerning, the  patient over the past year has had intermittent complaints of dyspnea on  exertion, mostly with work. The works delivering chips. He also has a  one-year history of intermittent paroxysmal nocturnal dyspnea. The  patient sleeps on one pillow. He has had no recent chest pain. No lower  extremity edema, no palpitations. At the outside hospital, the patient  had a CT scan that showed no pulmonary embolus with some suggestion of  possible pneumonia. At current, the patient has no complaints of  shortness of breath or of chest pain.   PAST MEDICAL HISTORY:  1. Rheumatic fever as a child.  2. GERD.  3. Hypertension.  4. History of chest pain in 2004, status post cardiac catheterization      at that time in [place] with no obstructive coronary disease by      report.   MEDICINES AT THE OUTSIDE HOSPITAL:  1. Lovenox 75 mg subcu b.i.d.  2. Lopressor 5 mg IV x1.  3. Aspirin 325 mg p.o. x1.  4. Nitroglycerin paste.  5. Plavix 300 mg  x1.  6. Lasix 80 mg IV x1.   ALLERGIES:  No known drug allergies.   SOCIAL HISTORY:  The patient is married, does not smoke, works  delivering chips.   FAMILY HISTORY:  Father with non-Hodgkin's lymphoma. No known history of  coronary artery disease in the family.   REVIEW OF SYSTEMS:  All review of systems are reviewed in detail and are  negative except as noted in the history of present illness.   PHYSICAL EXAMINATION:  Blood pressure in the 150s/90s, heart rate is in  the 80s.  GENERAL:  Well-developed, well-nourished, alert and oriented x3. No  apparent distress.  HEENT:  Atraumatic and normocephalic. Pupils are equal, round, and  reactive to light. Extraocular movements intact. Oropharynx is clear.  NECK:  Supple. No adenopathy. No JVD. No carotid bruits.  CHEST:  Lungs clear to auscultation bilaterally with equal bilateral  breath sounds.  CORONARY:  Regular rhythm, normal rate, normal S1 and S2, 2/6  holosystolic murmur heard best at  the apex.  ABDOMEN:  Soft, nontender, nondistended, active bowel sounds. No  hepatosplenomegaly.  EXTREMITIES:  No clubbing, cyanosis, or edema.  NEUROLOGICAL:  No focal deficits.  SKIN:  No rashes.   LABORATORY DATA:  Sodium 136, potassium 3.8, chloride 102, bicarb 24,  BUN 14, creatinine 1.2, glucose 102. CK 221, 4.1, 2.1. Troponin 0.1,  0.12. White blood cell count 6.9, hematocrit 43, platelet count 196. EKG  at the outside hospital with a normal sinus rhythm, LVH, left atrial  enlargement, repolarization abnormality, anterior ST changes.   IMPRESSION AND PLAN:  A 50 year old male who presents with one week of  flu-like symptoms, though with one year of worsening dyspnea on exertion  and paroxysmal nocturnal dyspnea, history of rheumatic fever as a child.   PLAN:  1. Cardiovascular. Admit the patient to a telemetry bed, rule out with      serial cardiac enzymes, aspirin daily, Lipitor daily, lisinopril      for blood pressure  control, Lopressor for blood pressure control.      Plan for a transthoracic echocardiogram with concern for rheumatic      valvular disease. Check a BNP, EKG, daily EKGs, lipids in the      morning.  2. Admit labs to include CMP, CBC, coagulation panel, magnesium level,      thyroid function test.  3. Pulmonary. Get a PA and lateral chest x-ray.  4. Fluids, electrolytes, and nutrition. N.p.o. after midnight.      Otherwise regular diet, restrict I&Os, daily weights.      Rod Holler, MD  Electronically Signed     TRK/MEDQ  D:  04/13/2006  T:  04/14/2006  Job:  [id number]

## 2010-09-07 NOTE — Cardiovascular Report (Signed)
NAME:  [name] [name], [name] [name]            ACCOUNT NO.:  [id number]   MEDICAL RECORD NO.:  [id number]          PATIENT TYPE:  INP   LOCATION:  3702                         FACILITY:  [facility]   PHYSICIAN:  Bevelyn Buckles. Bensimhon, MDDATE OF BIRTH:  [birth year]   DATE OF PROCEDURE:  04/14/2006  DATE OF DISCHARGE:  04/14/2006                            CARDIAC CATHETERIZATION   PRIMARY CARE PHYSICIAN:  Dr. Donzetta Sprung.   CARDIOLOGIST:  Dr. Gala Romney and Dr. Andee Lineman.   PATIENT IDENTIFICATION:  [name] [name] is a 50 year old male with a  history of rheumatic fever as a child.  He underwent cardiac  catheterization several years ago with normal coronary arteries.  However, the past year, he has had progressive heart failure symptoms  and was admitted with some chest pain and heart failure.  BNP was  elevated at 1200.  There was also evidence of mitral regurgitation on  his physical exam.  He is thus brought to the cardiac catheterization  lab for right and left heart catheterization.   PROCEDURES PERFORMED:  1. Right heart cath.  2. Left heart cath.  3. Left ventriculogram.  4. Selective coronary angiography.  5. Angio-Seal closure device.   DESCRIPTION OF PROCEDURE:  The risks and benefits of catheterization  were explained.  Consent was signed and placed on the chart.  A 5-French  arterial sheath was placed in the right femoral artery using a modified  Seldinger technique.  Standard catheters including a preformed JL-4, JR-  4 and angled pigtail were used for the catheterization.  All catheter  exchanges were made over wire.  No apparent complications.  A 7-French  venous sheath was placed in the right femoral vein.  A standard Swan-  Ganz catheter was used for the right heart catheterization.   HEMODYNAMIC RESULTS:  Right atrial pressure mean of 6, RV pressure 53/2,  PA pressure 44/19 with a mean of 30.  Pulmonary capillary wedge pressure  20/18 with a mean of 16.  No apparent significant  V-waves.  Central  aortic pressure was 113/77 with a mean of 95.  The proximal LV pressure  was 113/1 with an EDP of 14.  The distal LV pressure was 196/5 with an  EDP of 23.  On pullback across the LV outflow tract gradient and aortic  valve, there was a peak gradient of 83 with a mean of 49.  Fick cardiac  output was 3.1 liters per minute.  Cardiac index was 1.7 liters per  minute per meter squared.  Pulmonary vascular resistance was 4.6 Woods  units.   Of note, there was no significant calcification of the mitral valve or  aortic valve on angiography.  On simultaneous wedge and LV pressures,  there was no evidence of mitral stenosis.   Left main was normal.   LAD was a very large vessel wrapping the apex.  It gave off a large  diagonal.  There was some minor irregularity in the midsection.   Left circumflex was a large dominant vessel.  It gave off a moderate-  sized ramus, a large OM-1, a large posterolateral and a large PDA.  It  was angiographically normal.   The right coronary artery was a small nondominant vessel.   Left ventriculogram done in the RAO position:  The ejection fraction was  hard to quantify due to significant ectopy.  It appeared to be 40-45%  with no regional wall motion abnormalities.  There was evidence of  mitral valve prolapse with probable 2-3+ mitral regurgitation.   ASSESSMENT:  1. Normal coronary arteries.  2. Low normal left ventricular function as described above.  3. Evidence of possible mitral valve prolapse with 2-3+ mitral      regurgitation.  4. Apparent 80 mm gradient across the left ventricular outflow tract      without any significant evidence of aortic stenosis or aortic valve      calcification.  5. Decreased cardiac output.   PLAN/DISCUSSION:  [name] [name] obviously has a significant valvular  disease with what appears to be mild LV dysfunction and possible LV  outflow tract gradient.  We will wait on his echocardiogram to  reassess  his LV function and gauge severity of his valvular disease and evaluate  his LV outflow tract.      Bevelyn Buckles. Bensimhon, MD  Electronically Signed     DRB/MEDQ  D:  04/14/2006  T:  04/15/2006  Job:  [id number]   cc:   Noah Charon, MD,FACC

## 2010-09-18 ENCOUNTER — Encounter: Payer: Self-pay | Admitting: Physician Assistant

## 2011-07-09 ENCOUNTER — Other Ambulatory Visit (HOSPITAL_COMMUNITY): Payer: Self-pay | Admitting: Internal Medicine

## 2012-03-05 ENCOUNTER — Ambulatory Visit: Payer: BC Managed Care – PPO | Admitting: Physician Assistant

## 2012-03-25 ENCOUNTER — Encounter: Payer: Self-pay | Admitting: Physician Assistant

## 2012-03-25 ENCOUNTER — Ambulatory Visit (INDEPENDENT_AMBULATORY_CARE_PROVIDER_SITE_OTHER): Payer: BC Managed Care – PPO | Admitting: Physician Assistant

## 2012-03-25 VITALS — BP 110/68 | HR 72 | Ht 63.0 in | Wt 197.8 lb

## 2012-03-25 DIAGNOSIS — I421 Obstructive hypertrophic cardiomyopathy: Secondary | ICD-10-CM

## 2012-03-25 DIAGNOSIS — E785 Hyperlipidemia, unspecified: Secondary | ICD-10-CM

## 2012-03-25 NOTE — Patient Instructions (Addendum)
Your physician wants you to follow-up in: 6 MONTHS WITH DR. Gala Romney. You will receive a reminder letter in the mail two months in advance. If you don't receive a letter, please call our office to schedule the follow-up appointment.  Your physician has requested that you have an echocardiogram DX HOCM THIS CAN BE DONE IN THE NEXT 1-2 MONTHS. Echocardiography is a painless test that uses sound waves to create images of your heart. It provides your doctor with information about the size and shape of your heart and how well your heart's chambers and valves are working. This procedure takes approximately one hour. There are no restrictions for this procedure.  NO CHANGES WERE MADE TODAY

## 2012-03-25 NOTE — Progress Notes (Signed)
[address] [place], [place]  [address] Phone: [phone], Fax:  [phone]  Date:  03/25/2012   Name:  [name] [name]   DOB:  [birth year]   MRN:  [id number]  PCP:  ADDIS,DANIEL, DO  Primary Cardiologist:  Dr. Arvilla Meres  Primary Electrophysiologist:  None    History of Present Illness: [name] [name] is a 51 y.o. male who returns for follow up.  He has a hx of HOCM, status post septal myomectomy in 04/2006 at [facility].  Followup echocardiogram showed an EF of 60% with a mild residual gradient of 2-3 meters per second across his LV outflow tract.  He also has a history of mild mitral regurgitation, hypertension, hyperlipidemia with a low HDL. He was admitted to [facility] in 3/12 with syncope and atypical chest discomfort.  He saw one of our cardiologists there.  He had mildly elevated troponins.  This was likely related to strain from his LVH.  His CK-MB's were negative.  He had an echocardiogram that demonstrated moderate to severe LVH and EF of 65%.  His resting LVOT peak velocity was calculated at 2.21 m/s.  A Myoview study was performed.  This demonstrated a small, fixed defect in the apical septal segment.  There was a small, reversible defect in the mid anterolateral, apical lateral segments.  Findings were similar to a prior study in 05/2009.  Mild apical lateral ischemia could not be ruled out and his EF was 53%.  Last seen in 4/12.  Since last seen, he is doing well.  The patient denies chest pain, shortness of breath, syncope, orthopnea, PND or significant pedal edema.   Wt Readings from Last 3 Encounters:  03/25/12 197 lb 12.8 oz (89.721 kg)  08/01/10 191 lb (86.637 kg)  02/21/10 196 lb (88.905 kg)     Past Medical History  Diagnosis Date  . HOCM (hypertrophic obstructive cardiomyopathy)      --cardiac cath 2007. nomrmal cors;  s/p myomectomy 2008;  Echo 3/12: mod to severe LVH, HCM, EF 65%, resting LVOT peak 2.21 m/s, mild LAE,  mild MR, AV mean 9  . Hyperlipidemia   . GERD (gastroesophageal reflux disease)   . Erectile dysfunction   . Chest pain      -Lexiscan Myoview 3/12: ? small apical lateral ischemia, EF 53%; similar to study in 2/11  . HTN (hypertension)     Current Outpatient Prescriptions  Medication Sig Dispense Refill  . aspirin 81 MG tablet Take 81 mg by mouth daily.        . hydrochlorothiazide (HYDRODIURIL) 25 MG tablet TAKE 1 TABLET DAILY  90 tablet  7  . lisinopril (PRINIVIL,ZESTRIL) 40 MG tablet TAKE 1 TABLET DAILY  90 tablet  7  . metoprolol succinate (TOPROL-XL) 100 MG 24 hr tablet TAKE 1 TABLET TWICE A DAY  180 tablet  7  . chlorhexidine (PERIDEX) 0.12 % solution       . gemfibrozil (LOPID) 600 MG tablet Take 600 mg by mouth daily.        . sildenafil (VIAGRA) 100 MG tablet Take 100 mg by mouth daily as needed.          Allergies:  No Known Allergies  Social History:  The patient  reports that he has never smoked. He does not have any smokeless tobacco history on file.   ROS:  Please see the history of present illness.   All other systems reviewed and negative.   PHYSICAL EXAM: VS:  BP 110/68  Pulse 72  Ht 5\' 3"  (1.6 m)  Wt 197 lb 12.8 oz (89.721 kg)  BMI 35.04 kg/m2 Well nourished, well developed, in no acute distress HEENT: normal Neck: no JVD Cardiac:  normal S1, S2; RRR; 1/6 systolic ejection murmur heard best along the left sternal border Lungs:  clear to auscultation bilaterally, no wheezing, rhonchi or rales Abd: soft, nontender, no hepatomegaly Ext: no edema Skin: warm and dry Neuro:  CNs 2-12 intact, no focal abnormalities noted  EKG:  NSR, HR 72, LAD, LVH, repolarization abnormality, no change prior tracing      ASSESSMENT AND PLAN:  1. HOCM:   Last echocardiogram performed in 06/2010. Patient is currently asymptomatic. Arrange followup echocardiogram in the next several months to reassess LVOT gradient.  2. Hypertension:  Controlled.  Continue current  therapy.  3. Hyperlipidemia:   Continue Lopid. Obtain recent labs from his PCP.  4. Disposition:   Arrange followup in 6 months. I will review with Dr. Gala Romney to see if the patient will continue with him in the CHF clinic or will be referred to a new cardiologist.  Signed, Tereso Newcomer, PA-C  12:09 PM 03/25/2012

## 2012-04-10 ENCOUNTER — Ambulatory Visit (HOSPITAL_COMMUNITY): Payer: BC Managed Care – PPO | Attending: Physician Assistant | Admitting: Radiology

## 2012-04-10 DIAGNOSIS — E785 Hyperlipidemia, unspecified: Secondary | ICD-10-CM

## 2012-04-10 DIAGNOSIS — I517 Cardiomegaly: Secondary | ICD-10-CM | POA: Insufficient documentation

## 2012-04-10 DIAGNOSIS — I421 Obstructive hypertrophic cardiomyopathy: Secondary | ICD-10-CM

## 2012-04-10 NOTE — Progress Notes (Signed)
Echocardiogram performed.  

## 2012-04-13 ENCOUNTER — Telehealth: Payer: Self-pay | Admitting: *Deleted

## 2012-04-13 ENCOUNTER — Encounter: Payer: Self-pay | Admitting: Physician Assistant

## 2012-04-13 NOTE — Telephone Encounter (Signed)
Message copied by Tarri Fuller on Mon Apr 13, 2012  4:15 PM ------      Message from: [place], [place] T      Created: Mon Apr 13, 2012 11:27 AM       Findings stable when compared echo 2012.  No significant change.      Tereso Newcomer, PA-C  11:27 AM 04/13/2012

## 2012-04-13 NOTE — Telephone Encounter (Signed)
lmom echo stable, no significant changes from echo done in 2012

## 2012-05-27 ENCOUNTER — Other Ambulatory Visit (HOSPITAL_COMMUNITY): Payer: BC Managed Care – PPO

## 2013-04-09 ENCOUNTER — Telehealth: Payer: Self-pay | Admitting: *Deleted

## 2013-04-09 NOTE — Telephone Encounter (Signed)
Patient left vm on refill line requesting refills on metoprolol, lisinopril, and hctz. It looks like he was supposed to follow up with Dr. Gala Romney, but failed to do so. I am sending to you to fill if applicable or have patient schedule appointment or get through pcp. Please advise. Thanks, MI

## 2013-04-09 NOTE — Telephone Encounter (Signed)
Chantel can you please refill and sch with [place]

## 2013-04-13 ENCOUNTER — Ambulatory Visit (INDEPENDENT_AMBULATORY_CARE_PROVIDER_SITE_OTHER): Payer: BC Managed Care – PPO | Admitting: Physician Assistant

## 2013-04-13 ENCOUNTER — Encounter: Payer: Self-pay | Admitting: Physician Assistant

## 2013-04-13 VITALS — BP 150/90 | HR 81 | Ht 63.0 in | Wt 195.0 lb

## 2013-04-13 DIAGNOSIS — I421 Obstructive hypertrophic cardiomyopathy: Secondary | ICD-10-CM | POA: Insufficient documentation

## 2013-04-13 DIAGNOSIS — I10 Essential (primary) hypertension: Secondary | ICD-10-CM

## 2013-04-13 MED ORDER — LISINOPRIL 40 MG PO TABS: ORAL_TABLET | ORAL | Status: DC

## 2013-04-13 MED ORDER — HYDROCHLOROTHIAZIDE 25 MG PO TABS: 25.0000 mg | ORAL_TABLET | Freq: Every day | ORAL | Status: DC

## 2013-04-13 MED ORDER — METOPROLOL SUCCINATE ER 100 MG PO TB24: ORAL_TABLET | ORAL | Status: DC

## 2013-04-13 NOTE — Patient Instructions (Signed)
REFILLS HAVE BEEN SENT IN TO [facility] TODAY   YOU NEED TO SCHEDULE FOR A GXT, DX HOCM  SCHEDULE AN ECHO; DX HOCM  Your physician wants you to follow-up in: 1 YEAR WITH DR. Excell Seltzer. You will receive a reminder letter in the mail two months in advance. If you don't receive a letter, please call our office to schedule the follow-up appointment.

## 2013-04-13 NOTE — Progress Notes (Signed)
[address] [place], [place]  [address] Phone: [phone] Fax:  [phone]  Date:  04/13/2013   ID:  [name] [name], DOB 02-18-1961, MRN [id number]  PCP:  Frederica Kuster, MD  Cardiologist:  Dr. Arvilla Meres => Dr. Tonny Bollman     History of Present Illness: [name] [name] is a 52 y.o. male with a hx of HOCM, status post septal myomectomy in 04/2006 at [facility], mild mitral regurgitation, HTN, HL with a low HDL. He was admitted to [facility] in 3/12 with syncope and atypical chest discomfort. He saw one of our cardiologists there. He had mildly elevated troponins. This was likely related to strain from his LVH. His CK-MB's were negative. Echo (06/2010): moderate to severe LVH and EF of 65%, resting LVOT peak velocity was calculated at 2.21 m/s. A Myoview study demonstrated a small, fixed defect in the apical septal segment. There was a small, reversible defect in the mid anterolateral, apical lateral segments. Findings were similar to a prior study in 05/2009. Mild apical lateral ischemia could not be ruled out and his EF was 53%.   I saw him in f/u 03/2012.  F/u echo (03/2012):  Severe LVH, EF 80%, LVOT peak velocity 1.33 m/s, septal thickness 21 mm, Gr 2 DD, mild LAE.  He has been doing well since last seen. He denies chest pain, syncope, PND, edema, dyspnea.  Recent Labs: No results found for requested labs within last 365 days.  Wt Readings from Last 3 Encounters:  03/25/12 197 lb 12.8 oz (89.721 kg)  08/01/10 191 lb (86.637 kg)  02/21/10 196 lb (88.905 kg)     Past Medical History  Diagnosis Date  . HOCM (hypertrophic obstructive cardiomyopathy)      --cardiac cath 2007. nomrmal cors;  s/p myomectomy 2008;  Echo 3/12: mod to severe LVH, HCM, EF 65%, resting LVOT peak 2.21 m/s, mild LAE, mild MR, AV mean 9;  Echo 12/13: severe LVH, EF 80%, Gr 2 diast dsyfn, mild LAE, mild RVE  . Hyperlipidemia   . GERD (gastroesophageal reflux disease)   . Erectile  dysfunction   . Chest pain      -Lexiscan Myoview 3/12: ? small apical lateral ischemia, EF 53%; similar to study in 2/11  . HTN (hypertension)     Current Outpatient Prescriptions  Medication Sig Dispense Refill  . aspirin 81 MG tablet Take 81 mg by mouth daily.        . chlorhexidine (PERIDEX) 0.12 % solution       . gemfibrozil (LOPID) 600 MG tablet Take 600 mg by mouth daily.        . hydrochlorothiazide (HYDRODIURIL) 25 MG tablet TAKE 1 TABLET DAILY  90 tablet  7  . lisinopril (PRINIVIL,ZESTRIL) 40 MG tablet TAKE 1 TABLET DAILY  90 tablet  7  . metoprolol succinate (TOPROL-XL) 100 MG 24 hr tablet TAKE 1 TABLET TWICE A DAY  180 tablet  7  . sildenafil (VIAGRA) 100 MG tablet Take 100 mg by mouth daily as needed.         No current facility-administered medications for this visit.    Allergies:   Review of patient's allergies indicates no known allergies.   Social History:  The patient  reports that he has never smoked. He does not have any smokeless tobacco history on file.   Family History:  The patient's family history includes Hodgkin's lymphoma in his father.   ROS:  Please see the history of present illness.  He notes some epigastric discomfort with certain meals. He also notes a recent episode of mild rectal bleeding. He had a colonoscopy one year ago that was normal per his report.   All other systems reviewed and negative.   PHYSICAL EXAM: VS:  BP 150/90  Pulse 81  Ht 5\' 3"  (1.6 m)  Wt 195 lb (88.451 kg)  BMI 34.55 kg/m2 Well nourished, well developed, in no acute distress HEENT: normal Neck: no JVD Cardiac:  normal S1, S2; RRR; 1/6 systolic murmur along the left sternal border Lungs:  clear to auscultation bilaterally, no wheezing, rhonchi or rales Abd: soft, nontender, no hepatomegaly Ext: no edema Skin: warm and dry Neuro:  CNs 2-12 intact, no focal abnormalities noted  EKG:  NSR, HR 81, LVH with repol abnormality, IVCD, no change from prior tracing      ASSESSMENT AND PLAN:  1. Hypertrophic Obstructive Cardiomyopathy:  I reviewed his case today with Dr. Excell Seltzer. He will see him in the future for follow up. The patient does not have a family history of sudden cardiac death. He has not had any recent symptoms of syncope.  His septal thickness was 21 mm when last checked by echocardiogram in 2013. We will obtain a follow up echocardiogram to reassess his LV wall thickness and his LVOT gradient. We will also arrange an exercise treadmill test to rule out hypotension with exercise.  Continue current medications. 2. Hypertension: Uncontrolled. The patient has been out of his medications for several days. His medications will be refilled. I will obtain a recent basic metabolic panel done by his primary care physician. 3. Disposition:  Follow up with Dr. Excell Seltzer in one year.  Signed, Tereso Newcomer, PA-C  04/13/2013 2:08 PM

## 2013-05-26 ENCOUNTER — Encounter: Payer: BC Managed Care – PPO | Admitting: Physician Assistant

## 2013-05-26 ENCOUNTER — Other Ambulatory Visit (HOSPITAL_COMMUNITY): Payer: BC Managed Care – PPO

## 2013-07-07 ENCOUNTER — Ambulatory Visit (HOSPITAL_COMMUNITY)
Admission: RE | Admit: 2013-07-07 | Discharge: 2013-07-07 | Disposition: A | Discharge status: Home / Self Care | Payer: BC Managed Care – PPO | Source: Ambulatory Visit | Attending: Internal Medicine | Admitting: Internal Medicine

## 2013-07-07 VITALS — BP 124/76 | HR 50 | Wt 190.5 lb

## 2013-07-07 DIAGNOSIS — I10 Essential (primary) hypertension: Secondary | ICD-10-CM | POA: Insufficient documentation

## 2013-07-07 DIAGNOSIS — I421 Obstructive hypertrophic cardiomyopathy: Secondary | ICD-10-CM | POA: Insufficient documentation

## 2013-07-07 NOTE — Progress Notes (Signed)
[address], [place], [place]  [address] Phone: [phone] Fax:  [phone]  Date:  07/07/2013   ID:  RAMIZ TURPIN, DOB 1960-12-09, MRN [id number]  PCP:  Lonzo Cloud, MD  Cardiologist:  Dr. Glori Bickers => Dr. Sherren Mocha     History of Present Illness: RIKER COLLIER is a 53 y.o. male with a hx of HOCM, status post septal myomectomy in 04/2006 at [facility], mild mitral regurgitation, HTN, HL with a low HDL. He was admitted to [facility] in 3/12 with syncope and atypical chest discomfort. He saw one of our cardiologists there. He had mildly elevated troponins. This was likely related to strain from his LVH. His CK-MB's were negative. Echo (06/2010): moderate to severe LVH and EF of 65%, resting LVOT peak velocity was calculated at 2.21 m/s. A Myoview study demonstrated a small, fixed defect in the apical septal segment. There was a small, reversible defect in the mid anterolateral, apical lateral segments. Findings were similar to a prior study in 05/2009. Mild apical lateral ischemia could not be ruled out and his EF was 53%.   He saw Richardson Dopp in f/u 03/2012.  F/u echo (03/2012):  Severe LVH, EF 80%, LVOT peak velocity 1.33 m/s, septal thickness 21 mm, Gr 2 DD, mild LAE.  He has been doing well since last seen. He denies chest pain, syncope, PND, edema, dyspnea. At that time he was schedule for ETT to further evaluate BP response to exercise and help determine if he needed an ICD or not. The weather was baand this was not done.  Returns for routine f/u. 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 At his DOT physical noted his BP was high. Has not taken it since. BP normal here.   Recent Labs: No results found for requested labs within last 365 days.  Wt Readings from Last 3 Encounters:  07/07/13 190 lb 8 oz (86.41 kg)  04/13/13 195 lb (88.451 kg)  03/25/12 197 lb 12.8 oz (89.721 kg)     Past Medical History    Diagnosis Date  . HOCM (hypertrophic obstructive cardiomyopathy)      --cardiac cath 2007. nomrmal cors;  s/p myomectomy 2008;  Echo 3/12: mod to severe LVH, HCM, EF 65%, resting LVOT peak 2.21 m/s, mild LAE, mild MR, AV mean 9;  Echo 12/13: severe LVH, EF 80%, Gr 2 diast dsyfn, mild LAE, mild RVE  . Hyperlipidemia   . GERD (gastroesophageal reflux disease)   . Erectile dysfunction   . Chest pain      -Lexiscan Myoview 3/12: ? small apical lateral ischemia, EF 53%; similar to study in 2/11  . HTN (hypertension)     Current Outpatient Prescriptions  Medication Sig Dispense Refill  . hydrochlorothiazide (HYDRODIURIL) 25 MG tablet Take 1 tablet (25 mg total) by mouth daily.  90 tablet  3  . lisinopril (PRINIVIL,ZESTRIL) 40 MG tablet TAKE 1 TABLET DAILY  90 tablet  3  . metoprolol succinate (TOPROL-XL) 100 MG 24 hr tablet TAKE 1 TABLET TWICE A DAY  180 tablet  3  . Omega-3 Fatty Acids (FISH OIL) 1000 MG CAPS Take 2 capsules by mouth daily.       No current facility-administered medications for this encounter.    Allergies:   Review of patient's allergies indicates no known allergies.   Social History:  The patient  reports that he has never smoked. He does not have any smokeless tobacco  history on file.   Family History:  The patient's family history includes Hodgkin's lymphoma in his father.     PHYSICAL EXAM: VS:  BP 124/76  Pulse 50  Wt 190 lb 8 oz (86.41 kg)  SpO2 98% Well nourished, well developed, in no acute distress HEENT: normal Neck: no JVD Cardiac:  normal S1, S2; RRR; very faint systolic murmur along the left sternal border Lungs:  clear to auscultation bilaterally, no wheezing, rhonchi or rales Abd: soft, nontender, no hepatomegaly Ext: no edema Skin: warm and dry Neuro:  CNs 2-12 intact, no focal abnormalities noted  EKG:  Sbrady 49, LVH with repol abnormality, IVCD, no change from prior tracing     ASSESSMENT AND PLAN:  1. Hypertrophic Obstructive  Cardiomyopathy:  Doing very well. Asymptomatic. We discussed role of ETT to look for BP response to exercise and possible need for ICD. Given their health insurance cost is a major concern here. We have decicded to defer this to later in the year when they have met their insurance deductible. Overall I feel he is at low risk for malignant arrhythmias but not zero risk. They will contact us later in the year. We will hold off on repeat echo at this point. We did discuss need for screening of children every 5 years.   2.   Hypertension: Well controlled continue regimen.   3.  Disposition:  Follow up in one year.  Benay Spice 10:51 AM   07/07/2013 10:28 AM

## 2014-02-08 ENCOUNTER — Telehealth (HOSPITAL_COMMUNITY): Payer: Self-pay | Admitting: Vascular Surgery

## 2014-02-08 DIAGNOSIS — I421 Obstructive hypertrophic cardiomyopathy: Secondary | ICD-10-CM

## 2014-02-08 NOTE — Telephone Encounter (Signed)
Pt wife called pt was seen in March and Dr. Gala RomneyBensimhon wanted the pt to get a ETT  .Marland Kitchen. But pt had some insurance issues. The wife would like to know if pt could get the test before the end of the year.. Please advise

## 2014-02-22 NOTE — Telephone Encounter (Signed)
Per last OV note ok to sch GXT, order placed will contact pt to schedule

## 2014-02-22 NOTE — Telephone Encounter (Signed)
GXT sch for 12/2 at 3:30, attempted to call pt and Left message to call back

## 2014-03-08 NOTE — Telephone Encounter (Signed)
Still unable to reach pt regarding appt, left detailed mess on ID VM and letter mailed with appt date and time

## 2014-03-23 ENCOUNTER — Encounter: Payer: BC Managed Care – PPO | Admitting: Nurse Practitioner

## 2014-03-30 ENCOUNTER — Ambulatory Visit (INDEPENDENT_AMBULATORY_CARE_PROVIDER_SITE_OTHER): Payer: BC Managed Care – PPO | Admitting: Physician Assistant

## 2014-03-30 DIAGNOSIS — I421 Obstructive hypertrophic cardiomyopathy: Secondary | ICD-10-CM

## 2014-03-30 NOTE — Progress Notes (Signed)
Exercise Treadmill Test  Pre-Exercise Testing Evaluation Rhythm: sinus bradycardia  Rate: 53 bpm     Test  Exercise Tolerance Test Ordering MD: Arvilla Meresaniel Bensimhon, MD  Interpreting MD: Tereso NewcomerScott , PA-C  Unique Test No: 1  Treadmill:  1  Indication for ETT: HOCM  Contraindication to ETT: No   Stress Modality: exercise - treadmill  Cardiac Imaging Performed: non   Protocol: standard [name] - maximal  Max BP:  209/99  Max MPHR (bpm):  168 85% MPR (bpm):  143  MPHR obtained (bpm):  141 % MPHR obtained:  83  Reached 85% MPHR (min:sec):  n/a Total Exercise Time (min-sec):  11:52  Workload in METS:  13.1 Borg Scale: 15  Reason ETT Terminated:  fatigue    ST Segment Analysis At Rest: non-specific ST segment slurring - LVH with repolarization abnormality. With Exercise: non-specific ST changes  Other Information Arrhythmia:  No Angina during ETT:  absent (0) Quality of ETT:  non-diagnostic for ischemia  ETT Interpretation:  normal - no evidence of ischemia by ST analysis at submaximal exercise (83% age [facility])  Comments: Excellent exercise capacity. No chest pain. Normal BP response to exercise. Equivocal BP response initially (165/86 >> 159/84 >> 170/81 >> 169/86 >> 173/92).  However, BP increased significantly at peak exercise (203/100). No significant ST changes from baseline at submaximal exercise (83%) to suggest ischemia.  No ventricular arrhythmias with exercise.  Low Risk ETT.  Recommendations: FU with Dr. Arvilla Meresaniel Bensimhon as planned. Signed,  Tereso NewcomerScott , PA-C   03/30/2014 9:50 AM

## 2014-05-08 ENCOUNTER — Other Ambulatory Visit: Payer: Self-pay | Admitting: Physician Assistant

## 2015-01-31 ENCOUNTER — Other Ambulatory Visit: Payer: Self-pay | Admitting: Internal Medicine

## 2015-02-03 ENCOUNTER — Encounter (HOSPITAL_COMMUNITY): Payer: Self-pay | Admitting: *Deleted

## 2015-02-03 NOTE — Progress Notes (Signed)
Received signed request from [facility] requesting records on patient, records faxed to [phone]

## 2015-02-16 ENCOUNTER — Ambulatory Visit (HOSPITAL_COMMUNITY)
Admission: RE | Admit: 2015-02-16 | Discharge: 2015-02-16 | Disposition: A | Discharge status: Home / Self Care | Payer: BLUE CROSS/BLUE SHIELD | Source: Ambulatory Visit | Attending: Internal Medicine | Admitting: Internal Medicine

## 2015-02-16 VITALS — BP 122/72 | HR 50 | Wt 193.0 lb

## 2015-02-16 DIAGNOSIS — I421 Obstructive hypertrophic cardiomyopathy: Secondary | ICD-10-CM | POA: Diagnosis present

## 2015-02-16 DIAGNOSIS — K219 Gastro-esophageal reflux disease without esophagitis: Secondary | ICD-10-CM | POA: Diagnosis not present

## 2015-02-16 DIAGNOSIS — E785 Hyperlipidemia, unspecified: Secondary | ICD-10-CM | POA: Insufficient documentation

## 2015-02-16 DIAGNOSIS — I10 Essential (primary) hypertension: Secondary | ICD-10-CM | POA: Diagnosis not present

## 2015-02-16 DIAGNOSIS — Z79899 Other long term (current) drug therapy: Secondary | ICD-10-CM | POA: Diagnosis not present

## 2015-02-16 NOTE — Progress Notes (Signed)
Patient ID: [name] [name], male   DOB: 05/17/1960, 54 y.o.   MRN: [id number]  [address] [place], [place]  [address] Phone: [phone](336) [phone] Fax:  [phone]  Date:  02/16/2015   ID:  [name] [name], DOB 05/14/1960, MRN [id number]  PCP:  Frederica KusterLAZO,IVAN, MD  Cardiologist:  Dr. Arvilla Meresaniel      History of Present Illness: [name] [name] is a 54 y.o. male with a hx of HOCM, status [facility], mild mitral regurgitation, HTN, HL with a low HDL. He was admitted to [facility] in 3/12 with syncope and atypical chest discomfort. He saw one of our cardiologists there. He had mildly elevated troponins. This was likely related to strain from his LVH. His CK-MB's were negative. Echo (06/2010): moderate to severe LVH and EF of 65%, resting LVOT peak velocity was calculated at 2.21 m/s. A Myoview study demonstrated a small, fixed defect in the apical septal segment. There was a small, reversible defect in the mid anterolateral, apical lateral segments. Findings were similar to a prior study in 05/2009. Mild apical lateral ischemia could not be ruled out and his EF was 53%.   He saw Tereso NewcomerScott Weaver in f/u 03/2012.  F/u echo (03/2012):  Severe LVH, EF 80%, LVOT peak velocity 1.33 m/s, septal thickness 21 mm, Gr 2 DD, mild LAE.  He has been doing well since last seen. He denies chest pain, syncope, PND, edema, dyspnea.   Had ETT 04/05/15: 12:00 min [name] Protocol. 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    Recent Labs: No results found for requested labs within last 365 days.  Wt Readings from Last 3 Encounters:  02/16/15 193 lb (87.544 kg)  07/07/13 190 lb 8 oz (86.41 kg)  04/13/13 195 lb (88.451 kg)     Past Medical History  Diagnosis Date  . HOCM  (hypertrophic obstructive cardiomyopathy)      --cardiac cath 2007. nomrmal cors;  s/p myomectomy 2008;  Echo 3/12: mod to severe LVH, HCM, EF 65%, resting LVOT peak 2.21 m/s, mild LAE, mild MR, AV mean 9;  Echo 12/13: severe LVH, EF 80%, Gr 2 diast dsyfn, mild LAE, mild RVE  . Hyperlipidemia   . GERD (gastroesophageal reflux disease)   . Erectile dysfunction   . Chest pain      -Lexiscan Myoview 3/12: ? small apical lateral ischemia, EF 53%; similar to study in 2/11  . HTN (hypertension)     Current Outpatient Prescriptions  Medication Sig Dispense Refill  . hydrochlorothiazide (HYDRODIURIL) 25 MG tablet TAKE 1 TABLET DAILY 90 tablet 1  . lisinopril (PRINIVIL,ZESTRIL) 40 MG tablet TAKE 1 TABLET DAILY 90 tablet 1  . metoprolol succinate (TOPROL-XL) 100 MG 24 hr tablet TAKE 1 TABLET TWICE A DAY 180 tablet 1  . Omega-3 Fatty Acids (FISH OIL) 1000 MG CAPS Take 2 capsules by mouth daily.     No current facility-administered medications for this encounter.    Allergies:   Review of patient's allergies indicates no known allergies.   Social History:  The patient  reports that he has never smoked. He does not have any smokeless tobacco history on file.   Family History:  The patient's family history includes Hodgkin's lymphoma in his father.  PHYSICAL EXAM: VS:  BP 122/72 mmHg  Pulse 50  Wt 193 lb (87.544 kg)  SpO2 99% Well nourished, well developed, in no acute distress HEENT: normal Neck: no JVD Cardiac:  normal S1, S2; RRR; no murmurs even with provocative manuevers Lungs:  clear to auscultation bilaterally, no wheezing, rhonchi or rales Abd: soft, nontender, no hepatomegaly Ext: no edema Skin: warm and dry Neuro:  CNs 2-12 intact, no focal abnormalities noted  EKG:  Sbrady 47, LVH with repol abnormality, IVCD, no change from prior tracing     ASSESSMENT AND PLAN:  1. Hypertrophic Obstructive Cardiomyopathy:  Doing very well. Asymptomatic. ETT in 12/15 with excellent  exercise tolerance. No arrhythmias and good BP response to exercise. He has not had any high-risk features for SCD and does not meet cirteria ICD. 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 Will repeat echo at next visit. We did discuss need for screening of children every 5 years which they are doing.    2.   Hypertension: Well controlled continue regimen.   3.  Disposition:  Follow up in one year. With echo. Ok for DOT physical. Will not need yearly exercise tests unless he develops symptoms.    , ,MD 11:17 AM

## 2015-03-01 ENCOUNTER — Encounter (HOSPITAL_COMMUNITY): Payer: Self-pay | Admitting: Internal Medicine

## 2015-06-22 ENCOUNTER — Other Ambulatory Visit (HOSPITAL_COMMUNITY): Payer: Self-pay | Admitting: *Deleted

## 2015-06-22 MED ORDER — HYDROCHLOROTHIAZIDE 25 MG PO TABS: 25.0000 mg | ORAL_TABLET | Freq: Every day | ORAL | Status: DC

## 2015-06-22 MED ORDER — LISINOPRIL 40 MG PO TABS: 40.0000 mg | ORAL_TABLET | Freq: Every day | ORAL | Status: DC

## 2015-06-22 MED ORDER — METOPROLOL SUCCINATE ER 100 MG PO TB24: 100.0000 mg | ORAL_TABLET | Freq: Two times a day (BID) | ORAL | Status: DC

## 2016-01-17 ENCOUNTER — Encounter (HOSPITAL_COMMUNITY): Payer: Self-pay | Admitting: Internal Medicine

## 2016-01-17 ENCOUNTER — Ambulatory Visit (HOSPITAL_COMMUNITY)
Admission: RE | Admit: 2016-01-17 | Discharge: 2016-01-17 | Disposition: A | Discharge status: Home / Self Care | Payer: BLUE CROSS/BLUE SHIELD | Source: Ambulatory Visit | Attending: Internal Medicine | Admitting: Internal Medicine

## 2016-01-17 VITALS — BP 132/80 | HR 58 | Wt 187.2 lb

## 2016-01-17 DIAGNOSIS — R55 Syncope and collapse: Secondary | ICD-10-CM | POA: Insufficient documentation

## 2016-01-17 DIAGNOSIS — R0789 Other chest pain: Secondary | ICD-10-CM | POA: Insufficient documentation

## 2016-01-17 DIAGNOSIS — Z79899 Other long term (current) drug therapy: Secondary | ICD-10-CM | POA: Insufficient documentation

## 2016-01-17 DIAGNOSIS — E785 Hyperlipidemia, unspecified: Secondary | ICD-10-CM | POA: Insufficient documentation

## 2016-01-17 DIAGNOSIS — I10 Essential (primary) hypertension: Secondary | ICD-10-CM | POA: Diagnosis not present

## 2016-01-17 DIAGNOSIS — I421 Obstructive hypertrophic cardiomyopathy: Secondary | ICD-10-CM | POA: Diagnosis present

## 2016-01-17 DIAGNOSIS — K219 Gastro-esophageal reflux disease without esophagitis: Secondary | ICD-10-CM | POA: Insufficient documentation

## 2016-01-17 MED ORDER — LOSARTAN POTASSIUM 100 MG PO TABS: 100.0000 mg | ORAL_TABLET | Freq: Every day | ORAL | 3 refills | Status: DC

## 2016-01-17 NOTE — Progress Notes (Signed)
Patient ID: [name] [name], male   DOB: 01/27/1961, 55 y.o.   MRN: [id number]  [address] [place], [place]  [address] Phone: [phone](336) [phone] Fax:  [phone](336) [phone]  Date:  01/17/2016   ID:  [name] [name], DOB 03/01/1961, MRN [id number]  PCP:  Frederica KusterLAZO,IVAN, MD  Cardiologist:  Dr. Arvilla Meresaniel Bensimhon     History of Present Illness: [name] [name] is a 55 y.o. male with a hx of HOCM, status [facility], mild mitral regurgitation, HTN, HL with a low HDL. He was admitted [facility] in 3/12 with syncope and atypical chest discomfort. He saw one of our cardiologists there. He had mildly elevated troponins. This was likely related to strain from his LVH. His CK-MB's were negative. Echo (06/2010): moderate to severe LVH and EF of 65%, resting LVOT peak velocity was calculated at 2.21 m/s. A Myoview study demonstrated a small, fixed defect in the apical septal segment. There was a small, reversible defect in the mid anterolateral, apical lateral segments. Findings were similar to a prior study in 05/2009. Mild apical lateral ischemia could not be ruled out and his EF was 53%.   He saw Tereso NewcomerScott Weaver in f/u 03/2012.  F/u echo (03/2012):  Severe LVH, EF 80%, LVOT peak velocity 1.33 m/s, septal thickness 21 mm, Gr 2 DD, mild LAE.  He has been doing well since last seen. He denies chest pain, syncope, PND, edema, dyspnea.   Had ETT 04/04/14: 12:00 min [name] Protocol. 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    Recent Labs: No results found for requested labs within last 8760 hours.  Wt Readings from Last 3 Encounters:  01/17/16 187 lb 4 oz (84.9 kg)  02/16/15 193 lb (87.5 kg)  07/07/13 190 lb 8 oz (86.4 kg)     Past Medical History:  Diagnosis Date  . Chest pain     -Lexiscan  Myoview 3/12: ? small apical lateral ischemia, EF 53%; similar to study in 2/11  . Erectile dysfunction   . GERD (gastroesophageal reflux disease)   . HOCM (hypertrophic obstructive cardiomyopathy) (HCC)     --cardiac cath 2007. nomrmal cors;  s/p myomectomy 2008;  Echo 3/12: mod to severe LVH, HCM, EF 65%, resting LVOT peak 2.21 m/s, mild LAE, mild MR, AV mean 9;  Echo 12/13: severe LVH, EF 80%, Gr 2 diast dsyfn, mild LAE, mild RVE  . HTN (hypertension)   . Hyperlipidemia     Current Outpatient Prescriptions  Medication Sig Dispense Refill  . hydrochlorothiazide (HYDRODIURIL) 25 MG tablet Take 1 tablet (25 mg total) by mouth daily. 90 tablet 3  . lisinopril (PRINIVIL,ZESTRIL) 40 MG tablet Take 1 tablet (40 mg total) by mouth daily. 90 tablet 3  . metoprolol succinate (TOPROL-XL) 100 MG 24 hr tablet Take 1 tablet (100 mg total) by mouth 2 (two) times daily. Take with or immediately following a meal. 180 tablet 3  . Omega-3 Fatty Acids (FISH OIL) 1000 MG CAPS Take 2 capsules by mouth daily.     No current facility-administered medications for this encounter.     Allergies:   Review of patient's allergies indicates no known allergies.   Social History:  The patient  reports that he has never smoked. He does not have  any smokeless tobacco history on file.   Family History:  The patient's family history includes Hodgkin's lymphoma in his father.     PHYSICAL EXAM: VS:  BP 132/80   Pulse (!) 58   Wt 187 lb 4 oz (84.9 kg)   SpO2 99%   BMI 33.17 kg/m  Well nourished, well developed, in no acute distress  HEENT: normal  Neck: no JVD  Cardiac:  normal S1, S2; RRR; no murmurs even with provocative manuevers Lungs:  clear to auscultation bilaterally, no wheezing, rhonchi or rales  Abd: soft, nontender, no hepatomegaly  Ext: no edema  Skin: warm and dry  Neuro:  CNs 2-12 intact, no focal abnormalities noted  EKG:  Sbrady 47, LVH with repol abnormality, IVCD, no change from prior  tracing     ASSESSMENT AND PLAN:  1. Hypertrophic Obstructive Cardiomyopathy:  Doing very well. Asymptomatic. ETT in 12/15 with excellent exercise tolerance. No arrhythmias and good BP response to exercise. He has not had any high-risk features for SCD and does not meet cirteria ICD.               --He remains very active without any cardiac symptoms. 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            --Will repeat echo at next visit. We did discuss need for screening of children every 5 years which they are doing.    2.   Hypertension: Well controlled continue regimen.   3.  Disposition:  Follow up in one year. With echo. Ok for DOT physical. Will not need yearly exercise tests unless he develops symptoms.    Bensimhon, Daniel,MD 3:39 PM

## 2016-01-17 NOTE — Addendum Note (Signed)
Encounter addended by: Noralee SpaceHeather M , RN on: 01/17/2016  4:00 PM<BR>    Actions taken: Medication long-term status modified, Order Entry activity accessed, Sign clinical note

## 2016-01-17 NOTE — Patient Instructions (Signed)
Stop Lisinopril  Start losartan 100 mg daily  We will contact you in 1 year to schedule your next appointment.

## 2016-05-13 ENCOUNTER — Encounter: Payer: Self-pay | Admitting: Internal Medicine

## 2016-06-27 ENCOUNTER — Encounter: Payer: Self-pay | Admitting: *Deleted

## 2016-07-05 ENCOUNTER — Ambulatory Visit: Payer: BLUE CROSS/BLUE SHIELD | Admitting: Internal Medicine

## 2016-09-07 ENCOUNTER — Other Ambulatory Visit (HOSPITAL_COMMUNITY): Payer: Self-pay | Admitting: Internal Medicine

## 2016-12-24 ENCOUNTER — Other Ambulatory Visit (HOSPITAL_COMMUNITY): Payer: Self-pay | Admitting: Internal Medicine

## 2017-01-16 ENCOUNTER — Encounter (HOSPITAL_COMMUNITY): Payer: Self-pay

## 2017-01-16 ENCOUNTER — Ambulatory Visit (HOSPITAL_COMMUNITY)
Admission: RE | Admit: 2017-01-16 | Discharge: 2017-01-16 | Disposition: A | Discharge status: Home / Self Care | Payer: BLUE CROSS/BLUE SHIELD | Source: Ambulatory Visit | Attending: Internal Medicine | Admitting: Internal Medicine

## 2017-01-16 VITALS — BP 142/90 | HR 56 | Wt 199.8 lb

## 2017-01-16 DIAGNOSIS — Z79899 Other long term (current) drug therapy: Secondary | ICD-10-CM | POA: Insufficient documentation

## 2017-01-16 DIAGNOSIS — I10 Essential (primary) hypertension: Secondary | ICD-10-CM

## 2017-01-16 DIAGNOSIS — Z6835 Body mass index (BMI) 35.0-35.9, adult: Secondary | ICD-10-CM | POA: Insufficient documentation

## 2017-01-16 DIAGNOSIS — R635 Abnormal weight gain: Secondary | ICD-10-CM | POA: Diagnosis not present

## 2017-01-16 DIAGNOSIS — I421 Obstructive hypertrophic cardiomyopathy: Secondary | ICD-10-CM | POA: Diagnosis not present

## 2017-01-16 DIAGNOSIS — N529 Male erectile dysfunction, unspecified: Secondary | ICD-10-CM | POA: Diagnosis not present

## 2017-01-16 DIAGNOSIS — E785 Hyperlipidemia, unspecified: Secondary | ICD-10-CM | POA: Insufficient documentation

## 2017-01-16 DIAGNOSIS — Z807 Family history of other malignant neoplasms of lymphoid, hematopoietic and related tissues: Secondary | ICD-10-CM | POA: Diagnosis not present

## 2017-01-16 DIAGNOSIS — K219 Gastro-esophageal reflux disease without esophagitis: Secondary | ICD-10-CM | POA: Insufficient documentation

## 2017-01-16 NOTE — Addendum Note (Signed)
Encounter addended by: Teresa Coombs, RN on: 01/16/2017 12:03 PM<BR>    Actions taken: Sign clinical note

## 2017-01-16 NOTE — Addendum Note (Signed)
Encounter addended by: Teresa Coombs, RN on: 01/16/2017 12:09 PM<BR>    Actions taken: Order list changed, Diagnosis association updated

## 2017-01-16 NOTE — Patient Instructions (Signed)
Your physician has requested that you have an echocardiogram. Echocardiography is a painless test that uses sound waves to create images of your heart. It provides your doctor with information about the size and shape of your heart and how well your heart's chambers and valves are working. This procedure takes approximately one hour. There are no restrictions for this procedure.  IF BP CONTINUES TO BE HIGHER THAN 140 (top number) please call [phone] opt [phone]   Your physician recommends that you schedule a follow-up appointment in: 1 year

## 2017-01-16 NOTE — Progress Notes (Signed)
Patient ID: [name] [name], male   DOB: 08-29-1960, 56 y.o.   MRN: [id number]  [address] [place], [place]  [address] Phone: [phone] Fax:  [phone]  Date:  01/16/2017   ID:  [name] [name], DOB Sep 16, 1960, MRN [id number]  PCP:  Frederica Kuster, MD  Cardiologist:  Dr. Arvilla Meres     History of Present Illness: [name] [name] is a 56 y.o. male with a hx of HOCM, status post septal myomectomy in 04/2006 at [facility], mild mitral regurgitation, HTN, HL with a low HDL. (cardiac cath 2007 with normal coronaries) He was admitted to [facility] in 3/12 with syncope and atypical chest discomfort. He saw one of our cardiologists there. He had mildly elevated troponins. This was likely related to strain from his LVH. His CK-MB's were negative. Echo (06/2010): moderate to severe LVH and EF of 65%, resting LVOT peak velocity was calculated at 2.21 m/s. A Myoview study demonstrated a small, fixed defect in the apical septal segment. There was a small, reversible defect in the mid anterolateral, apical lateral segments. Findings were similar to a prior study in 05/2009. Mild apical lateral ischemia could not be ruled out and his EF was 53%.   He saw Tereso Newcomer in f/u 03/2012.  F/u echo (03/2012):  Severe LVH, EF 80%, LVOT peak velocity 1.33 m/s, septal thickness 21 mm, Gr 2 DD, mild LAE.  He has been doing well since last seen. He denies chest pain, syncope, PND, edema, dyspnea.   Had ETT 04/04/14: 12:00 min [name] Protocol. 13.1 METS Peak HR 141 BP 159/84 -> 203/100  Here for routine f/u. Feels well. 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 No edema, orthopnea or PND. No palpitations. + fatigue. Just mild snoring     Recent Labs: No results found for requested labs within last 8760 hours.  Wt Readings from Last 3 Encounters:  01/16/17 199 lb 12.8 oz (90.6 kg)  01/17/16 187 lb 4 oz (84.9 kg)  02/16/15 193 lb (87.5 kg)     Past Medical History:    Diagnosis Date  . Chest pain     -Lexiscan Myoview 3/12: ? small apical lateral ischemia, EF 53%; similar to study in 2/11  . Erectile dysfunction   . GERD (gastroesophageal reflux disease)   . HOCM (hypertrophic obstructive cardiomyopathy) (HCC)     --cardiac cath 2007. nomrmal cors;  s/p myomectomy 2008;  Echo 3/12: mod to severe LVH, HCM, EF 65%, resting LVOT peak 2.21 m/s, mild LAE, mild MR, AV mean 9;  Echo 12/13: severe LVH, EF 80%, Gr 2 diast dsyfn, mild LAE, mild RVE  . HTN (hypertension)   . Hyperlipidemia     Current Outpatient Prescriptions  Medication Sig Dispense Refill  . hydrochlorothiazide (HYDRODIURIL) 25 MG tablet TAKE 1 TABLET DAILY 90 tablet 3  . losartan (COZAAR) 100 MG tablet TAKE 1 TABLET DAILY 90 tablet 3  . metoprolol succinate (TOPROL-XL) 100 MG 24 hr tablet TAKE 1 TABLET TWICE A DAY, TAKE WITH OR IMMEDIATELY FOLLOWING A MEAL 180 tablet 3  . Omega-3 Fatty Acids (FISH OIL) 1000 MG CAPS Take 2 capsules by mouth daily.     No current facility-administered medications for this encounter.     Allergies:   Patient has no known allergies.   Social History:  The patient  reports that he has never smoked. He does not have any smokeless tobacco history on file.   Family History:  The  patient's family history includes Hodgkin's lymphoma in his father.     PHYSICAL EXAM: VS:  BP (!) 142/90   Pulse (!) 56   Wt 199 lb 12.8 oz (90.6 kg)   SpO2 97%   BMI 35.39 kg/m  General:  Well appearing. No resp difficulty HEENT: normal Neck: supple. no JVD. Carotids 2+ bilat; no bruits. No lymphadenopathy or thryomegaly appreciated. Cor: PMI nondisplaced. Regular rate & rhythm. No rubs, gallops or murmurs. No murmur with provocative movements Lungs: clear Abdomen: obese, soft, nontender, nondistended. No hepatosplenomegaly. No bruits or masses. Good bowel sounds. Extremities: no cyanosis, clubbing, rash, edema Neuro: alert & orientedx3, cranial nerves grossly intact. moves  all 4 extremities w/o difficulty. Affect pleasant   EKG:  Sbrady 59, LVH with repol abnormality, IVCD, no change from prior tracing     ASSESSMENT AND PLAN:  1. Hypertrophic Obstructive Cardiomyopathy:  Doing very well. Asymptomatic. ETT in 12/15 with excellent exercise tolerance. No arrhythmias and good BP response to exercise. He has not had any high-risk features for SCD and does not meet cirteria ICD.               --He remains very active without any cardiac symptoms. 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            --Will repeat echo at next visit. We did discuss need for screening of children every 5 years which they are doing.             --Will order echo today  2.   Hypertension:           --BP mildly elevated here but has been well controlled at baseline. Will continue to follow. I have asked him to get BP cuff at home to follow.   3. Weight gain        --stressed need to watch diet and lose weight  , ,MD 11:49 AM

## 2017-02-20 ENCOUNTER — Ambulatory Visit (HOSPITAL_COMMUNITY): Payer: BLUE CROSS/BLUE SHIELD | Attending: Cardiovascular Disease

## 2017-02-20 DIAGNOSIS — I081 Rheumatic disorders of both mitral and tricuspid valves: Secondary | ICD-10-CM | POA: Insufficient documentation

## 2017-02-20 DIAGNOSIS — R55 Syncope and collapse: Secondary | ICD-10-CM | POA: Diagnosis not present

## 2017-02-20 DIAGNOSIS — I119 Hypertensive heart disease without heart failure: Secondary | ICD-10-CM | POA: Insufficient documentation

## 2017-02-20 DIAGNOSIS — E785 Hyperlipidemia, unspecified: Secondary | ICD-10-CM | POA: Insufficient documentation

## 2017-02-20 DIAGNOSIS — I421 Obstructive hypertrophic cardiomyopathy: Secondary | ICD-10-CM | POA: Insufficient documentation

## 2017-02-20 MED ORDER — PERFLUTREN LIPID MICROSPHERE
1.0000 mL | INTRAVENOUS | Status: AC | PRN
  Administered 2017-02-20: 2 mL via INTRAVENOUS

## 2017-04-04 ENCOUNTER — Encounter: Payer: Self-pay | Admitting: Internal Medicine

## 2017-04-04 ENCOUNTER — Other Ambulatory Visit (INDEPENDENT_AMBULATORY_CARE_PROVIDER_SITE_OTHER): Payer: BLUE CROSS/BLUE SHIELD

## 2017-04-04 ENCOUNTER — Ambulatory Visit (INDEPENDENT_AMBULATORY_CARE_PROVIDER_SITE_OTHER): Payer: BLUE CROSS/BLUE SHIELD | Admitting: Internal Medicine

## 2017-04-04 ENCOUNTER — Encounter (INDEPENDENT_AMBULATORY_CARE_PROVIDER_SITE_OTHER): Payer: Self-pay

## 2017-04-04 VITALS — BP 120/86 | HR 62 | Ht 63.0 in | Wt 198.0 lb

## 2017-04-04 DIAGNOSIS — R1013 Epigastric pain: Secondary | ICD-10-CM | POA: Diagnosis not present

## 2017-04-04 DIAGNOSIS — K625 Hemorrhage of anus and rectum: Secondary | ICD-10-CM | POA: Diagnosis not present

## 2017-04-04 LAB — COMPREHENSIVE METABOLIC PANEL
ALBUMIN: 4.1 g/dL (ref 3.5–5.2)
ALT: 19 U/L (ref 0–53)
AST: 19 U/L (ref 0–37)
Alkaline Phosphatase: 50 U/L (ref 39–117)
BUN: 18 mg/dL (ref 6–23)
CHLORIDE: 101 meq/L (ref 96–112)
CO2: 30 meq/L (ref 19–32)
CREATININE: 1.04 mg/dL (ref 0.40–1.50)
Calcium: 8.7 mg/dL (ref 8.4–10.5)
GFR: 78.77 mL/min (ref >60.00)
GLUCOSE: 87 mg/dL (ref 70–99)
POTASSIUM: 3.9 meq/L (ref 3.5–5.1)
SODIUM: 136 meq/L (ref 135–145)
Total Bilirubin: 0.6 mg/dL (ref 0.2–1.2)
Total Protein: 7.7 g/dL (ref 6.0–8.3)

## 2017-04-04 LAB — HIGH SENSITIVITY CRP: CRP HIGH SENSITIVITY: 11.74 mg/L — AB (ref 0.000–5.000)

## 2017-04-04 LAB — CBC WITH DIFFERENTIAL/PLATELET
BASOS PCT: 0.5 % (ref 0.0–3.0)
Basophils Absolute: 0 10*3/uL (ref 0.0–0.1)
EOS ABS: 0.1 10*3/uL (ref 0.0–0.7)
EOS PCT: 1.6 % (ref 0.0–5.0)
HCT: 42.1 % (ref 39.0–52.0)
HEMOGLOBIN: 14.1 g/dL (ref 13.0–17.0)
LYMPHS ABS: 2 10*3/uL (ref 0.7–4.0)
Lymphocytes Relative: 34.1 % (ref 12.0–46.0)
MCHC: 33.5 g/dL (ref 30.0–36.0)
MCV: 92.3 fl (ref 78.0–100.0)
MONO ABS: 0.5 10*3/uL (ref 0.1–1.0)
Monocytes Relative: 9.3 % (ref 3.0–12.0)
NEUTROS ABS: 3.2 10*3/uL (ref 1.4–7.7)
Neutrophils Relative %: 54.5 % (ref 43.0–77.0)
PLATELETS: 228 10*3/uL (ref 150.0–400.0)
RBC: 4.56 Mil/uL (ref 4.22–5.81)
RDW: 12.7 % (ref 11.5–15.5)
WBC: 5.8 10*3/uL (ref 4.0–10.5)

## 2017-04-04 LAB — IGA: IGA: 282 mg/dL (ref 68–378)

## 2017-04-04 NOTE — Progress Notes (Signed)
Patient ID: [name] [name], male   DOB: 08/12/1960, 56 y.o.   MRN: [id number] HPI: [name] [name] is a 56 year old male with a past medical history of hypertrophic obstructive cardiomyopathy (status post sept[facility]), hypertension, hyperlipidemia, erectile dysfunction who is seen in consultation at the request of Dr. Jonette EvaLazo to evaluate upper abdominal pain.  He is here alone today.  He reports that over the last several years developing episodic upper abdominal pain located across the upper epigastrium.  He describes episodes which are described as stabbing, burning and pressure.  He also described this as a tightness and bloating.  Reports that episodes are crescendo/decrescendo in nature and can last several hours.  Seem to happen more in the late evening or wake him from sleep.  Last episode was a few months ago.  He reports the only trigger that he has definitely been able to identify as if he eats peanuts.  He is strictly avoiding this in his diet.  He does not have associated nausea or vomiting.  The pain does not radiate.  He denies heartburn, dysphagia and odynophagia.  No weight loss.  He reports bowel movements have been regular without diarrhea or constipation.  He does report seeing intermittent rectal bleeding which is painless in nature and both with wiping and occasionally in the toilet water.  This happens less than once monthly.  No anal pain.  No change in bowel habits.  He had a screening colonoscopy performed at age 89 by Dr. Virgilio [place]Shifflett in [place].  He recalls that they "saw something" and he was told initially he would need follow-up in 3 years but then he received a telephone call saying to disregard that information and that he would need repeat colonoscopy in 10 years.  These records are not available today.  There is no family history of colorectal cancer.  Past Medical History:  Diagnosis Date  . Chest pain     -Lexiscan Myoview 3/12: ?  small apical lateral ischemia, EF 53%; similar to study in 2/11  . Erectile dysfunction   . GERD (gastroesophageal reflux disease)   . HOCM (hypertrophic obstructive cardiomyopathy) (HCC)     --cardiac cath 2007. nomrmal cors;  s/p myomectomy 2008;  Echo 3/12: mod to severe LVH, HCM, EF 65%, resting LVOT peak 2.21 m/s, mild LAE, mild MR, AV mean 9;  Echo 12/13: severe LVH, EF 80%, Gr 2 diast dsyfn, mild LAE, mild RVE  . HTN (hypertension)   . Hyperlipidemia     Past Surgical History:  Procedure Laterality Date  . MYOMECTOMY     [facility] at [facility]     Outpatient Medications Prior to Visit  Medication Sig Dispense Refill  . hydrochlorothiazide (HYDRODIURIL) 25 MG tablet TAKE 1 TABLET DAILY 90 tablet 3  . losartan (COZAAR) 100 MG tablet TAKE 1 TABLET DAILY 90 tablet 3  . metoprolol succinate (TOPROL-XL) 100 MG 24 hr tablet TAKE 1 TABLET TWICE A DAY, TAKE WITH OR IMMEDIATELY FOLLOWING A MEAL 180 tablet 3  . Omega-3 Fatty Acids (FISH OIL) 1000 MG CAPS Take 2 capsules by mouth daily.     No facility-administered medications prior to visit.     No Known Allergies  Family History  Problem Relation Age of Onset  . Hodgkin's lymphoma Father     Social History   Tobacco Use  . Smoking status: Never Smoker  Substance Use Topics  . Alcohol use: No    Frequency: Never  . Drug  use: No    ROS: As per history of present illness, otherwise negative  BP 120/86   Pulse 62   Ht 5\' 3"  (1.6 m)   Wt 198 lb (89.8 kg)   BMI 35.07 kg/m  Constitutional: Well-developed and well-nourished. No distress. HEENT: Normocephalic and atraumatic. Oropharynx is clear and moist. Conjunctivae are normal.  No scleral icterus. Neck: Neck supple. Trachea midline. Cardiovascular: Normal rate, regular rhythm and intact distal pulses. No M/R/G Pulmonary/chest: Effort normal and breath sounds normal. No wheezing, rales or rhonchi. Abdominal: Soft, nontender, nondistended. Bowel sounds active throughout.  There are no masses palpable. No hepatosplenomegaly. Rectal: No external lesions, no masses, soft light brown stool in the vault which is heme negative, normal tone Extremities: no clubbing, cyanosis, or edema Neurological: Alert and oriented to person place and time. Skin: Skin is warm and dry.  Psychiatric: Normal mood and affect. Behavior is normal.  RELEVANT LABS AND IMAGING: CBC    Component Value Date/Time   WBC 5.8 04/04/2017 1532   RBC 4.56 04/04/2017 1532   HGB 14.1 04/04/2017 1532   HCT 42.1 04/04/2017 1532   PLT 228.0 04/04/2017 1532   MCV 92.3 04/04/2017 1532   MCHC 33.5 04/04/2017 1532   RDW 12.7 04/04/2017 1532   LYMPHSABS 2.0 04/04/2017 1532   MONOABS 0.5 04/04/2017 1532   EOSABS 0.1 04/04/2017 1532   BASOSABS 0.0 04/04/2017 1532    CMP     Component Value Date/Time   NA 136 04/04/2017 1532   K 3.9 04/04/2017 1532   CL 101 04/04/2017 1532   CO2 30 04/04/2017 1532   GLUCOSE 87 04/04/2017 1532   BUN 18 04/04/2017 1532   CREATININE 1.04 04/04/2017 1532   CALCIUM 8.7 04/04/2017 1532   PROT 7.7 04/04/2017 1532   ALBUMIN 4.1 04/04/2017 1532   AST 19 04/04/2017 1532   ALT 19 04/04/2017 1532   ALKPHOS 50 04/04/2017 1532   BILITOT 0.6 04/04/2017 1532   GFRNONAA 86 06/13/2006 1043   GFRAA 104 06/13/2006 1043    ASSESSMENT/PLAN: 56 year old male with a past medical history of hypertrophic obstructive cardiomyopathy (status post septal [facility] in January [facility] at [facility]), hypertension, hyperlipidemia, erectile dysfunction who is seen in consultation at the request of Dr. Jonette EvaLazo to evaluate upper abdominal pain.  1.  Episodic upper abdominal pain --symptoms suspicious for gallbladder pathology.  Will send for abdominal ultrasound.  Will check Helicobacter pylori stool antigen to exclude H. pylori infection.  Check CBC, CMP, CRP and celiac panel.  Symptoms are not that of GERD or dyspepsia given sporadic nature of symptoms.  2.  Rectal bleeding --possibly secondary  to internal hemorrhoids.  Rectal exam was normal today.  Will consider repeat colonoscopy for this issue but I would like to review his prior colonoscopy records from Dr. Virgilio BellingShifflett.  We will discuss this more at scheduled follow-up.  Return to the office in 2-3 months, sooner if needed.   WU:JWJXCc:Lazo, Shari Prowsvan, Md [address] [place], [place] [id number]

## 2017-04-04 NOTE — Patient Instructions (Signed)
Your physician has requested that you go to the basement for the following lab work before leaving today: CBC, CMP, CRP, IGA, TTG, H Pylori stool antigen  We will request previous records from Dr Shan LevansShifletts office.  If you are age 55 or older, your body mass index should be between 23-30. Your Body mass index is 35.07 kg/m. If this is out of the aforementioned range listed, please consider follow up with your Primary Care Provider.  If you are age 55 or younger, your body mass index should be between 19-25. Your Body mass index is 35.07 kg/m. If this is out of the aformentioned range listed, please consider follow up with your Primary Care Provider.

## 2017-04-07 ENCOUNTER — Other Ambulatory Visit: Payer: BLUE CROSS/BLUE SHIELD

## 2017-04-07 DIAGNOSIS — R1013 Epigastric pain: Secondary | ICD-10-CM

## 2017-04-07 DIAGNOSIS — K625 Hemorrhage of anus and rectum: Secondary | ICD-10-CM

## 2017-04-08 LAB — HELICOBACTER PYLORI  SPECIAL ANTIGEN
MICRO NUMBER:: 81416061
SPECIMEN QUALITY: ADEQUATE

## 2017-04-08 LAB — TISSUE TRANSGLUTAMINASE, IGA: (TTG) AB, IGA: 1 U/mL

## 2017-04-28 ENCOUNTER — Telehealth: Payer: Self-pay | Admitting: Internal Medicine

## 2017-04-28 NOTE — Telephone Encounter (Signed)
Wife is concerned about the elevated CRP level.  All questions answered.  She will call back for any additional questions or concerns.

## 2017-11-10 ENCOUNTER — Other Ambulatory Visit (HOSPITAL_COMMUNITY): Payer: Self-pay | Admitting: Internal Medicine

## 2017-11-26 ENCOUNTER — Emergency Department (HOSPITAL_COMMUNITY): Payer: BLUE CROSS/BLUE SHIELD

## 2017-11-26 ENCOUNTER — Telehealth (HOSPITAL_COMMUNITY): Payer: Self-pay | Admitting: *Deleted

## 2017-11-26 ENCOUNTER — Emergency Department (HOSPITAL_COMMUNITY)
Admission: EM | Admit: 2017-11-26 | Discharge: 2017-11-26 | Disposition: A | Discharge status: Home / Self Care | Payer: BLUE CROSS/BLUE SHIELD | Attending: Emergency Medicine | Admitting: Emergency Medicine

## 2017-11-26 ENCOUNTER — Other Ambulatory Visit: Payer: Self-pay

## 2017-11-26 ENCOUNTER — Encounter (HOSPITAL_COMMUNITY): Payer: Self-pay | Admitting: Emergency Medicine

## 2017-11-26 DIAGNOSIS — Z79899 Other long term (current) drug therapy: Secondary | ICD-10-CM | POA: Insufficient documentation

## 2017-11-26 DIAGNOSIS — R0789 Other chest pain: Secondary | ICD-10-CM | POA: Diagnosis not present

## 2017-11-26 DIAGNOSIS — I10 Essential (primary) hypertension: Secondary | ICD-10-CM | POA: Insufficient documentation

## 2017-11-26 DIAGNOSIS — R202 Paresthesia of skin: Secondary | ICD-10-CM

## 2017-11-26 DIAGNOSIS — R748 Abnormal levels of other serum enzymes: Secondary | ICD-10-CM | POA: Diagnosis present

## 2017-11-26 LAB — BASIC METABOLIC PANEL
Anion gap: 10 (ref 5–15)
BUN: 11 mg/dL (ref 6–20)
CHLORIDE: 99 mmol/L (ref 98–111)
CO2: 28 mmol/L (ref 22–32)
CREATININE: 1.17 mg/dL (ref 0.61–1.24)
Calcium: 8.7 mg/dL — ABNORMAL LOW (ref 8.9–10.3)
Glucose, Bld: 120 mg/dL — ABNORMAL HIGH (ref 70–99)
POTASSIUM: 4.2 mmol/L (ref 3.5–5.1)
SODIUM: 137 mmol/L (ref 135–145)

## 2017-11-26 LAB — CBC
HCT: 43.5 % (ref 39.0–52.0)
Hemoglobin: 14.8 g/dL (ref 13.0–17.0)
MCH: 31.2 pg (ref 26.0–34.0)
MCHC: 34 g/dL (ref 30.0–36.0)
MCV: 91.6 fL (ref 78.0–100.0)
PLATELETS: 192 10*3/uL (ref 150–400)
RBC: 4.75 MIL/uL (ref 4.22–5.81)
RDW: 12.3 % (ref 11.5–15.5)
WBC: 6.3 10*3/uL (ref 4.0–10.5)

## 2017-11-26 LAB — I-STAT TROPONIN, ED
TROPONIN I, POC: 0.05 ng/mL (ref 0.00–0.08)
Troponin i, poc: 0.05 ng/mL (ref 0.00–0.08)

## 2017-11-26 MED ORDER — ASPIRIN 81 MG PO CHEW
324.0000 mg | CHEWABLE_TABLET | Freq: Once | ORAL | Status: AC
Start: 2017-11-26 — End: 2017-11-26
  Administered 2017-11-26: 324 mg via ORAL
  Filled 2017-11-26: qty 4

## 2017-11-26 NOTE — ED Provider Notes (Signed)
  Physical Exam  BP (!) 147/98   Pulse (!) 50   Temp 98.2 F (36.8 C) (Oral)   Resp 16   SpO2 96%   Physical Exam  ED Course/Procedures     Procedures  MDM  Care assumed at 4 pm from Dr. Denton LankSteinl. Patient here with L arm paresthesias and trop 0.06 outpatient. No chest pain on arrival. Initial trop 0.05. Sign out pending repeat trop and if stable, can be discharged with cardiology and neurology follow up   4:57 PM Trop 0.05. Stable for discharge. Updated patient and family regarding plan of care      Charlynne PanderYao, David Hsienta, MD 11/26/17 [phone]

## 2017-11-26 NOTE — Telephone Encounter (Signed)
Pts wife left VM stating patient had a " mini stroke for two days" and was told to call our office. I called back to get more information no answer.

## 2017-11-26 NOTE — Discharge Instructions (Addendum)
It was our pleasure to provide your ER care today - we hope that you feel better.  Take an enteric coated aspirin a day.   For earlier symptoms, follow up with your cardiologist in the coming week - call office to arrange appointment.   For transient numbness, follow up with neurologist in the coming week - see referral - call office for appointment.   Return to ER if worse, recurrent or persistent chest pain, trouble breathing, one-sided numbness/weakness, change in speech or vision, other concern.

## 2017-11-26 NOTE — ED Triage Notes (Signed)
Pt to ER sent by PCP for elevated troponin, went to PCP yesterday for left arm numbness, tingling, radiating into left neck and jaw. Pt has hx of hypertrophic cardiomegaly. Pt denies CP at this time. In NAD.

## 2017-11-26 NOTE — ED Provider Notes (Signed)
[facility] EMERGENCY DEPARTMENT Provider Note   CSN: [id number] Arrival date & time: 11/26/17  1227     History   Chief Complaint Chief Complaint  Patient presents with  . Abnormal Lab    HPI [name] [name] is a 57 y.o. male.  Patient with hx cardiomyopathy presents c/o being told to come to ED by primary care office because his troponin level was elevated. Pt indicates 4 days ago, at rest, he had onset of left chest pain/numbness sensation that lasted approximately 30-40 seconds. He had a similar episode the following day, also at rest. Yesterday reports labs at pcp office and was called today w results, told it was .06 and to go to ER. Pt denies any current chest pain or discomfort. No sob. No numbness/weakness. Denies palpitations. No syncope. Pt sates compliant w normal meds. No fevers.   The history is provided by the patient.  Abnormal Lab    Past Medical History:  Diagnosis Date  . Chest pain     -Lexiscan Myoview 3/12: ? small apical lateral ischemia, EF 53%; similar to study in 2/11  . Erectile dysfunction   . GERD (gastroesophageal reflux disease)   . HOCM (hypertrophic obstructive cardiomyopathy) (HCC)     --cardiac cath 2007. nomrmal cors;  s/p myomectomy 2008;  Echo 3/12: mod to severe LVH, HCM, EF 65%, resting LVOT peak 2.21 m/s, mild LAE, mild MR, AV mean 9;  Echo 12/13: severe LVH, EF 80%, Gr 2 diast dsyfn, mild LAE, mild RVE  . HTN (hypertension)   . Hyperlipidemia     Patient Active Problem List   Diagnosis Date Noted  . HOCM (hypertrophic obstructive cardiomyopathy) (HCC) 04/13/2013  . Syncope 08/01/2010  . FATIGUE / MALAISE 02/21/2010  . HYPERLIPIDEMIA 06/21/2009  . Benign essential HTN 06/21/2009  . GERD 06/21/2009    Past Surgical History:  Procedure Laterality Date  . MYOMECTOMY     january 2008 at [facility] Medications    Prior to Admission medications   Medication Sig Start Date End Date Taking?  Authorizing Provider  hydrochlorothiazide (HYDRODIURIL) 25 MG tablet TAKE 1 TABLET DAILY 11/10/17   Bensimhon, Bevelyn Buckles, MD  losartan (COZAAR) 100 MG tablet TAKE 1 TABLET DAILY 12/25/16   Bensimhon, Bevelyn Buckles, MD  metoprolol succinate (TOPROL-XL) 100 MG 24 hr tablet TAKE 1 TABLET TWICE A DAY, TAKE WITH OR IMMEDIATELY FOLLOWING A MEAL 11/10/17   Bensimhon, Bevelyn Buckles, MD  Omega-3 Fatty Acids (FISH OIL) 1000 MG CAPS Take 2 capsules by mouth daily.    [provider]    Family History Family History  Problem Relation Age of Onset  . Hodgkin's lymphoma Father     Social History Social History   Tobacco Use  . Smoking status: Never Smoker  . Smokeless tobacco: Never Used  Substance Use Topics  . Alcohol use: No    Frequency: Never  . Drug use: No     Allergies   Patient has no known allergies.   Review of Systems Review of Systems  Constitutional: Negative for fever.  HENT: Negative for sore throat.   Eyes: Negative for redness.  Respiratory: Negative for shortness of breath.   Cardiovascular: Negative for palpitations and leg swelling.  Gastrointestinal: Negative for abdominal pain and vomiting.  Genitourinary: Negative for flank pain.  Musculoskeletal: Negative for back pain.  Skin: Negative for rash.  Neurological: Negative for headaches.  Hematological: Does not bruise/bleed easily.  Psychiatric/Behavioral: Negative for confusion.     Physical Exam Updated Vital Signs BP (!) 153/104 (BP Location: Right Arm)   Pulse (!) 56   Temp 98.2 F (36.8 C) (Oral)   Resp 16   SpO2 100%   Physical Exam  Constitutional: He appears well-developed and well-nourished.  HENT:  Mouth/Throat: Oropharynx is clear and moist.  Eyes: Conjunctivae are normal.  Neck: Neck supple. No tracheal deviation present.  No bruits.   Cardiovascular: Normal rate, regular rhythm, normal heart sounds and intact distal pulses. Exam reveals no gallop and no friction rub.  No murmur  heard. Pulmonary/Chest: Effort normal and breath sounds normal. No accessory muscle usage. No respiratory distress.  Abdominal: Soft. Bowel sounds are normal. He exhibits no distension. There is no tenderness.  Musculoskeletal: He exhibits no edema or tenderness.  Neurological: He is alert.  Speech clear/fluent. No facial asymmetry or droop. Motor intact bil, equal grip, stre 5/5. sens grossly intact. Steady gait.   Skin: Skin is warm and dry. No rash noted.  Psychiatric: He has a normal mood and affect.  Nursing note and vitals reviewed.    ED Treatments / Results  Labs (all labs ordered are listed, but only abnormal results are displayed) Results for orders placed or performed during the hospital encounter of 11/26/17  Basic metabolic panel  Result Value Ref Range   Sodium 137 135 - 145 mmol/L   Potassium 4.2 3.5 - 5.1 mmol/L   Chloride 99 98 - 111 mmol/L   CO2 28 22 - 32 mmol/L   Glucose, Bld 120 (H) 70 - 99 mg/dL   BUN 11 6 - 20 mg/dL   Creatinine, Ser 8.651.17 0.61 - 1.24 mg/dL   Calcium 8.7 (L) 8.9 - 10.3 mg/dL   GFR calc non Af Amer >60 >60 mL/min   GFR calc Af Amer >60 >60 mL/min   Anion gap 10 5 - 15  CBC  Result Value Ref Range   WBC 6.3 4.0 - 10.5 K/uL   RBC 4.75 4.22 - 5.81 MIL/uL   Hemoglobin 14.8 13.0 - 17.0 g/dL   HCT 78.443.5 69.639.0 - 29.552.0 %   MCV 91.6 78.0 - 100.0 fL   MCH 31.2 26.0 - 34.0 pg   MCHC 34.0 30.0 - 36.0 g/dL   RDW 28.412.3 13.211.5 - 44.015.5 %   Platelets 192 150 - 400 K/uL  I-stat troponin, ED  Result Value Ref Range   Troponin i, poc 0.05 0.00 - 0.08 ng/mL   Comment 3           Dg Chest 2 View  Result Date: 11/26/2017 CLINICAL DATA:  Stroke-like symptoms, LEFT-sided weakness, LEFT eye blurry vision, LEFT jaw numbness, elevated troponins, history hypertrophic obstructive cardiomyopathy, hypertension EXAM: CHEST - 2 VIEW COMPARISON:  05/29/2009 FINDINGS: Enlargement of cardiac silhouette post CABG. Epicardial pacing wires noted. Mediastinal contours and  pulmonary vascularity normal. Lungs clear. No pleural effusion or pneumothorax. Bones demineralized. IMPRESSION: Enlargement of cardiac silhouette post CABG with note of epicardial pacing wires. No acute abnormalities. Electronically Signed   By: Ulyses SouthwardMark  Boles M.D.   On: 11/26/2017 13:02    EKG None  Radiology Dg Chest 2 View  Result Date: 11/26/2017 CLINICAL DATA:  Stroke-like symptoms, LEFT-sided weakness, LEFT eye blurry vision, LEFT jaw numbness, elevated troponins, history hypertrophic obstructive cardiomyopathy, hypertension EXAM: CHEST - 2 VIEW COMPARISON:  05/29/2009 FINDINGS: Enlargement of cardiac silhouette post CABG. Epicardial pacing wires noted. Mediastinal contours and pulmonary vascularity normal. Lungs clear. No pleural effusion  or pneumothorax. Bones demineralized. IMPRESSION: Enlargement of cardiac silhouette post CABG with note of epicardial pacing wires. No acute abnormalities. Electronically Signed   By: Ulyses Southward M.D.   On: 11/26/2017 13:02    Procedures Procedures (including critical care time)  Medications Ordered in ED Medications - No data to display   Initial Impression / Assessment and Plan / ED Course  I have reviewed the triage vital signs and the nursing notes.  Pertinent labs & imaging results that were available during my care of the patient were reviewed by me and considered in my medical decision making (see chart for details).  Labs. Ecg.  Reviewed nursing notes and prior charts for additional history.  Cardiac cath 03/2006 with normal coronaries. History hypertrophic obstructive CM, s/p myomectomy 2008.  Labs reviewed - trop normal.   cxr reviewed - no pna.   Recheck pt - remains completely asymptomatic, no pain, no discomfort, no numbness.   Delta trop pending.   Family w question re paresthesias w prior episodes ?cva - those symptoms have been completely resolved in past couple days, and symptoms when present brief, 40s, do not feeling  advanced imaging today would be helpful. Discussed given recent symptoms plan that if delta trop normal, outpt f/u with cardiology, and neurology for further evaluation symptoms.   pts symptoms remain resolved.   Will start asa q day.     Final Clinical Impressions(s) / ED Diagnoses   Final diagnoses:  None    ED Discharge Orders    None       Cathren Laine, MD 11/26/17 [phone]

## 2017-12-18 ENCOUNTER — Ambulatory Visit (HOSPITAL_COMMUNITY)
Admission: RE | Admit: 2017-12-18 | Discharge: 2017-12-18 | Disposition: A | Discharge status: Home / Self Care | Payer: BLUE CROSS/BLUE SHIELD | Source: Ambulatory Visit | Attending: Internal Medicine | Admitting: Internal Medicine

## 2017-12-18 VITALS — BP 162/92 | HR 59 | Wt 195.4 lb

## 2017-12-18 DIAGNOSIS — R200 Anesthesia of skin: Secondary | ICD-10-CM | POA: Diagnosis not present

## 2017-12-18 DIAGNOSIS — Z6834 Body mass index (BMI) 34.0-34.9, adult: Secondary | ICD-10-CM | POA: Insufficient documentation

## 2017-12-18 DIAGNOSIS — I421 Obstructive hypertrophic cardiomyopathy: Secondary | ICD-10-CM | POA: Insufficient documentation

## 2017-12-18 DIAGNOSIS — Z79899 Other long term (current) drug therapy: Secondary | ICD-10-CM | POA: Insufficient documentation

## 2017-12-18 DIAGNOSIS — I10 Essential (primary) hypertension: Secondary | ICD-10-CM | POA: Insufficient documentation

## 2017-12-18 DIAGNOSIS — E785 Hyperlipidemia, unspecified: Secondary | ICD-10-CM | POA: Insufficient documentation

## 2017-12-18 DIAGNOSIS — Z7982 Long term (current) use of aspirin: Secondary | ICD-10-CM | POA: Diagnosis not present

## 2017-12-18 DIAGNOSIS — R635 Abnormal weight gain: Secondary | ICD-10-CM | POA: Insufficient documentation

## 2017-12-18 MED ORDER — ASPIRIN EC 81 MG PO TBEC: 325.0000 mg | DELAYED_RELEASE_TABLET | Freq: Every day | ORAL | Status: DC

## 2017-12-18 MED ORDER — ATORVASTATIN CALCIUM 40 MG PO TABS: 40.0000 mg | ORAL_TABLET | Freq: Every day | ORAL | 6 refills | Status: DC

## 2017-12-18 MED ORDER — CLOPIDOGREL BISULFATE 75 MG PO TABS: 75.0000 mg | ORAL_TABLET | Freq: Every day | ORAL | 6 refills | Status: DC

## 2017-12-18 NOTE — Progress Notes (Signed)
Patient ID: [name] [name], male   DOB: 09/27/1960, 57 y.o.   MRN: [id number]   Advanced Heart Failure Clinic Note   Date:  12/18/2017   ID:  [name] [name], DOB 04/21/1961, MRN [id number]  PCP:  Patient, No Pcp Per  Cardiologist:  Dr. Arvilla Meresaniel Bensimhon     History of Present Illness: [name] [name] is a 57 y.o. male with a hx of HOCM, status post septal [facility]ome[facility] in 04/2006 at [facility], mild mitral regurgitation, HTN, HL with a low HDL. (cardiac cath 2007 with normal coronaries) He was admitted to [facility] in 3/12 with syncope and atypical chest discomfort. He saw one of our cardiologists there. He had mildly elevated troponins. This was likely related to strain from his LVH. His CK-MB's were negative. Echo (06/2010): moderate to severe LVH and EF of 65%, resting LVOT peak velocity was calculated at 2.21 m/s. A Myoview study demonstrated a small, fixed defect in the apical septal segment. There was a small, reversible defect in the mid anterolateral, apical lateral segments. Findings were similar to a prior study in 05/2009. Mild apical lateral ischemia could not be ruled out and his EF was 53%.   He saw Tereso NewcomerScott Weaver in f/u 03/2012.  F/u echo (03/2012):  Severe LVH, EF 80%, LVOT peak velocity 1.33 m/s, septal thickness 21 mm, Gr 2 DD, mild LAE.  He has been doing well since last seen. He denies chest pain, syncope, PND, edema, dyspnea.   Had ETT 04/04/14: 12:00 min [name] Protocol. 13.1 METS Peak HR 141 BP 159/84 -> 203/100  He presents today for annual follow up. He was doing well until recently. 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 On 8/1, he had two episodes of LUE numbness that radiated into jaw and face. Both lasted about 30-40 seconds. During the second episode, he had vision loss in his left eye. Denies amaurosis The following day he had an episode of transient LLE numbness and tingling. He went to PCP on 8/5 and had labwork drawn, including a troponin  that came back 0.06 so [facility]. Felt to have parasthesias, He has not had any CP. Troponin in ED 0.05. EKG, CXR, BMET, and CBC all stable. He was sent home with follow up with neuro and cardiology outpatient. He has since had carotid US that showed right ICA 50-69% stenosis. PCP was considering starting plavix, but wanted to check with Dr Gala RomneyBensimhon first. Denies SOB, orthopnea, or edema. He is more fatigued after work, improved by am. No palpitations, dizziness, or syncope. Continues to work on delivery route. Not checking BP at home. Taking all medications.  Review of systems complete and found to be negative unless listed in HPI.   Recent Labs: 04/04/2017: ALT 19 11/26/2017: Creatinine, Ser 1.17; Hemoglobin 14.8; Potassium 4.2  Wt Readings from Last 3 Encounters:  12/18/17 88.6 kg (195 lb 6.4 oz)  04/04/17 89.8 kg (198 lb)  01/16/17 90.6 kg (199 lb 12.8 oz)     Past Medical History:  Diagnosis Date  . Chest pain     -Lexiscan Myoview 3/12: ? small apical lateral ischemia, EF 53%; similar to study in 2/11  . Erectile dysfunction   . GERD (gastroesophageal reflux disease)   . HOCM (hypertrophic obstructive cardiomyopathy) (HCC)     --cardiac cath 2007. nomrmal cors;  s/p myomectomy 2008;  Echo 3/12: mod to severe LVH, HCM, EF 65%, resting LVOT peak 2.21 m/s, mild LAE, mild MR,  AV mean 9;  Echo 12/13: severe LVH, EF 80%, Gr 2 diast dsyfn, mild LAE, mild RVE  . HTN (hypertension)   . Hyperlipidemia     Current Outpatient Medications  Medication Sig Dispense Refill  . aspirin 325 MG EC tablet Take 325 mg by mouth daily.    . hydrochlorothiazide (HYDRODIURIL) 25 MG tablet TAKE 1 TABLET DAILY 90 tablet 0  . losartan (COZAAR) 100 MG tablet TAKE 1 TABLET DAILY 90 tablet 3  . metoprolol succinate (TOPROL-XL) 100 MG 24 hr tablet TAKE 1 TABLET TWICE A DAY, TAKE WITH OR IMMEDIATELY FOLLOWING A MEAL 180 tablet 0   No current facility-administered medications for  this encounter.     Allergies:   Patient has no known allergies.   Social History:  The patient  reports that he has never smoked. He has never used smokeless tobacco. He reports that he does not drink alcohol or use drugs.   Family History:  The patient's family history includes Hodgkin's lymphoma in his father.     PHYSICAL EXAM: VS:  BP (!) 162/92   Pulse (!) 59   Wt 88.6 kg (195 lb 6.4 oz)   SpO2 96%   BMI 34.61 kg/m    Wt Readings from Last 3 Encounters:  12/18/17 88.6 kg (195 lb 6.4 oz)  04/04/17 89.8 kg (198 lb)  01/16/17 90.6 kg (199 lb 12.8 oz)    General: Well appearing. No resp difficulty. HEENT: Normal anicteric Neck: Supple. JVP flat. Carotids 2+ bilat; no bruits. No thyromegaly or nodule noted. Cor: PMI nondisplaced. RRR, No M/G/R noted even with provocative maneuvers  Lungs: CTAB, normal effort.no wheeze Abdomen: Soft, non-tender, non-distended, no HSM. No bruits or masses. +BS  Extremities: No cyanosis, clubbing, or rash. R and LLE no edema.  Neuro: Alert & orientedx3, cranial nerves grossly intact. moves all 4 extremities w/o difficulty. Affect pleasant. FTN ok. No dysdiadochokinesis.   EKG: Sinus brady 58 bpm with 1st degree aV block (216 ms), LVH with IVCD (120 ms) Personally reviewed   ASSESSMENT AND PLAN:  1. Hypertrophic Obstructive Cardiomyopathy:  Doing very well. Asymptomatic. ETT in 12/15 with excellent exercise tolerance. No arrhythmias and good BP response to exercise. He has not had any high-risk features for SCD and does not meet cirteria ICD. - Echo 02/2017: Severe LVH with mid and apical cavity obliteration in systolic, no SAM, mild MR, LA moderately dilated          --He remains very active without any cardiac symptoms. 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            --We did discuss need for screening of children every 5 years which they are doing.             --Continue Toprol XL 100 mg daily  2.    Hypertension:  - BP elevated today. Not checking BP at home. SBP 130-160s while in ED recently. - Continue HCTZ 25 mg daily, Toprol XL 100 mg daily, and losartan 100 mg daily   3. Weight gain - Body mass index is 34.61 kg/m.  4. Carotid stenosis with stroke like symptoms - Carotid US 12/05/17: right ICA with 50-69% stenosis - Decrease ASA to 81 mg daily - Start plavix 75 mg daily - Start atorvastatin 40 mg daily - Refer to neuro  - Get MRI/A of brain  - Repeat echo   Alford Highland, NP 2:52 PM  Patient seen and examined with  the above-signed Systems developer and/or Housestaff. I personally reviewed laboratory data, imaging studies and relevant notes. I independently examined the patient and formulated the important aspects of the plan. I have edited the note to reflect any of my changes or salient points. I have personally discussed the plan with the patient and/or family.  Doing well from a cardiac standpoint. Recent symptoms very concerning for TIA. Carotid US has moderate non-obstructive plaque but corresponds to side of transient deficits. Will start Plavix and statin. Refer to Neurology for probable MRI/MRA of head and neck. Will also get echo to make sure no source of cardiac emboli, however episodic nature of symptoms argues against this. Denies h/o HAs or migraines.   Arvilla Meres, MD  10:52 PM

## 2017-12-18 NOTE — Patient Instructions (Signed)
Decrease Aspirin to 81 mg daily  Start Plavix 75 mg daily  Start Atorvastatin 40 mg daily  Your physician has requested that you have an echocardiogram. Echocardiography is a painless test that uses sound waves to create images of your heart. It provides your doctor with information about the size and shape of your heart and how well your heart's chambers and valves are working. This procedure takes approximately one hour. There are no restrictions for this procedure.  You have been referred to [facility]

## 2017-12-19 ENCOUNTER — Encounter: Payer: Self-pay | Admitting: Neurology

## 2017-12-22 ENCOUNTER — Other Ambulatory Visit (HOSPITAL_COMMUNITY): Payer: Self-pay | Admitting: Internal Medicine

## 2017-12-24 ENCOUNTER — Ambulatory Visit (HOSPITAL_COMMUNITY)
Admission: RE | Admit: 2017-12-24 | Discharge: 2017-12-24 | Disposition: A | Discharge status: Home / Self Care | Payer: BLUE CROSS/BLUE SHIELD | Source: Ambulatory Visit | Attending: Internal Medicine | Admitting: Internal Medicine

## 2017-12-24 DIAGNOSIS — R200 Anesthesia of skin: Secondary | ICD-10-CM | POA: Insufficient documentation

## 2017-12-24 DIAGNOSIS — I071 Rheumatic tricuspid insufficiency: Secondary | ICD-10-CM | POA: Insufficient documentation

## 2017-12-24 DIAGNOSIS — E785 Hyperlipidemia, unspecified: Secondary | ICD-10-CM | POA: Insufficient documentation

## 2017-12-24 DIAGNOSIS — I421 Obstructive hypertrophic cardiomyopathy: Secondary | ICD-10-CM | POA: Diagnosis not present

## 2017-12-24 DIAGNOSIS — I119 Hypertensive heart disease without heart failure: Secondary | ICD-10-CM | POA: Diagnosis not present

## 2017-12-24 NOTE — Progress Notes (Signed)
  Echocardiogram 2D Echocardiogram has been performed.   L Androw 12/24/2017, 11:31 AM

## 2017-12-25 NOTE — Progress Notes (Signed)
NEUROLOGY CONSULTATION NOTE  [name] [name] MRN: [id number] DOB: 25-Dec-1960  Referring provider: Arvilla Meres, MD Primary care provider: Ian Malkin, FNP-C ([facility] in [place], [place])  Reason for consult:  Possible TIAs  HISTORY OF PRESENT ILLNESS: [name] [name] is a 57 year old right-handed male with hypertrophic obstructive cardiomyopathy, hypertension, and hyperlipidemia who presents for possible TIAs.  He is accompanied by his wife who supplements history.  History is also supplemented by ED note and referring provider's note.  On 11/20/17, he had episode of eft upper extremity numbness radiating to the left side of his face.  It lasted 30-40 seconds.  There was no associated headache or weakness.  He had a second episode that was associated with blurred vision in the left eye (he never closed either eye to check).  The following day, he had episode of left lower extremity numbness and tingling lasting a little longer than the previous episode.  No associated pain or weakness.  He was evaluated by his PCP on 11/24/17. Carotid doppler was performed and was found to have 50-69% stenosis in the right ICA, less than 50% stenosis in the left ICA. He reports he had a lipid panel drawn, which is not available.  Troponin was 0.06.  He was sent to the [facility] ED on 11/26/17 for further evaluation.  EKG showed no acute changes.  Repeat troponins were negative.  CBC and BMP were unremarkable.  He was started on ASA 325mg  daily.  Echocardiogram from 12/24/17 showed EF 60-65% with mild LVH and no atrial septal defect or PFO.  He followed up with his cardiologist, Dr. Teressa Lower, who started him on ASA 81mg  and Plavix 75mg  daily.  He has a history of blood in the stool and has been followed by Dr. Rhea Belton of GI.  Since starting dual antiplatelet therapy, he has had increased bleeding in the stool.    Current medication:  ASA 81mg  daily, Plavix 75mg , Lipitor 40mg , HCTZ 25mg , Cozaar 100mg , Toprol-XL  100mg  twice daily  PAST MEDICAL HISTORY: Past Medical History:  Diagnosis Date  . Chest pain     -Lexiscan Myoview 3/12: ? small apical lateral ischemia, EF 53%; similar to study in 2/11  . Erectile dysfunction   . GERD (gastroesophageal reflux disease)   . HOCM (hypertrophic obstructive cardiomyopathy) (HCC)     --cardiac cath 2007. nomrmal cors;  s/p myomectomy 2008;  Echo 3/12: mod to severe LVH, HCM, EF 65%, resting LVOT peak 2.21 m/s, mild LAE, mild MR, AV mean 9;  Echo 12/13: severe LVH, EF 80%, Gr 2 diast dsyfn, mild LAE, mild RVE  . HTN (hypertension)   . Hyperlipidemia     PAST SURGICAL HISTORY: Past Surgical History:  Procedure Laterality Date  . MYOMECTOMY     january 2008 at [facility]     MEDICATIONS: Current Outpatient Medications on File Prior to Visit  Medication Sig Dispense Refill  . aspirin 81 MG tablet Take 4 tablets (325 mg total) by mouth daily.    Marland Kitchen atorvastatin (LIPITOR) 40 MG tablet Take 1 tablet (40 mg total) by mouth daily. 30 tablet 6  . clopidogrel (PLAVIX) 75 MG tablet Take 1 tablet (75 mg total) by mouth daily. 30 tablet 6  . hydrochlorothiazide (HYDRODIURIL) 25 MG tablet TAKE 1 TABLET DAILY 90 tablet 0  . losartan (COZAAR) 100 MG tablet TAKE 1 TABLET DAILY 90 tablet 3  . metoprolol succinate (TOPROL-XL) 100 MG 24 hr tablet TAKE 1 TABLET TWICE A DAY, TAKE WITH OR  IMMEDIATELY FOLLOWING A MEAL 180 tablet 0   No current facility-administered medications on file prior to visit.     ALLERGIES: No Known Allergies  FAMILY HISTORY: Family History  Problem Relation Age of Onset  . Hodgkin's lymphoma Father    SOCIAL HISTORY: Social History   Socioeconomic History  . Marital status: Married    Spouse name: Not on file  . Number of children: Not on file  . Years of education: Not on file  . Highest education level: Not on file  Occupational History  . Not on file  Social Needs  . Financial resource strain: Not on file  . Food insecurity:     Worry: Not on file    Inability: Not on file  . Transportation needs:    Medical: Not on file    Non-medical: Not on file  Tobacco Use  . Smoking status: Never Smoker  . Smokeless tobacco: Never Used  Substance and Sexual Activity  . Alcohol use: No    Frequency: Never  . Drug use: No  . Sexual activity: Not on file  Lifestyle  . Physical activity:    Days per week: Not on file    Minutes per session: Not on file  . Stress: Not on file  Relationships  . Social connections:    Talks on phone: Not on file    Gets together: Not on file    Attends religious service: Not on file    Active member of club or organization: Not on file    Attends meetings of clubs or organizations: Not on file    Relationship status: Not on file  . Intimate partner violence:    Fear of current or ex partner: Not on file    Emotionally abused: Not on file    Physically abused: Not on file    Forced sexual activity: Not on file  Other Topics Concern  . Not on file  Social History Narrative  . Not on file    REVIEW OF SYSTEMS: Constitutional: No fevers, chills, or sweats, no generalized fatigue, change in appetite Eyes: No visual changes, double vision, eye pain Ear, nose and throat: No hearing loss, ear pain, nasal congestion, sore throat Cardiovascular: No chest pain, palpitations Respiratory:  No shortness of breath at rest or with exertion, wheezes GastrointestinaI: No nausea, vomiting, diarrhea, abdominal pain, fecal incontinence Genitourinary:  No dysuria, urinary retention or frequency Musculoskeletal:  No neck pain, back pain Integumentary: No rash, pruritus, skin lesions Neurological: as above Psychiatric: No depression, insomnia, anxiety Endocrine: No palpitations, fatigue, diaphoresis, mood swings, change in appetite, change in weight, increased thirst Hematologic/Lymphatic:  No purpura, petechiae. Allergic/Immunologic: no itchy/runny eyes, nasal congestion, recent allergic  reactions, rashes  PHYSICAL EXAM: Blood pressure 112/78, pulse 64, height 5\' 3"  (1.6 m), weight 198 lb (89.8 kg), SpO2 97 %. General: No acute distress.  Patient appears well-groomed.  Head:  Normocephalic/atraumatic Eyes:  fundi examined but not visualized Neck: supple, no paraspinal tenderness, full range of motion Back: No paraspinal tenderness Heart: regular rate and rhythm Lungs: Clear to auscultation bilaterally. Vascular: No carotid bruits. Neurological Exam: Mental status: alert and oriented to person, place, and time, recent and remote memory intact, fund of knowledge intact, attention and concentration intact, speech fluent and not dysarthric, language intact. Cranial nerves: CN I: not tested CN II: pupils equal, round and reactive to light, visual fields intact CN III, IV, VI:  full range of motion, no nystagmus, no ptosis CN V: facial  sensation intact CN VII: upper and lower face symmetric CN VIII: hearing intact CN IX, X: gag intact, uvula midline CN XI: sternocleidomastoid and trapezius muscles intact CN XII: tongue midline Bulk & Tone: normal, no fasciculations. Motor:  5/5 throughout  Sensation: temperature and vibration sensation intact. Deep Tendon Reflexes:  2+ throughout, toes downgoing.  Finger to nose testing:  Without dysmetria.  Heel to shin:  Without dysmetria.  Gait:  Normal station and stride.  Able to turn and tandem walk. Romberg negative.  IMPRESSION: 1.  TIA, lateralizing in distribution of right internal carotid artery 2.  Symptomatic right carotid artery stenosis 3.  HTN 4.  Hyperlipidemia  PLAN: 1.  Ideally, he should remain on dual antiplatelet therapy for now.  However, due to reported significant increased bleeding per rectum, advised to discontinue Plavix and remain on ASA 81mg  daily.  If bleeding does not subside over next few days, he should follow up with his gastroenterologist. 2.  Check MRI/MRA of head without contrast 3.  Continue  Lipitor 40mg  daily.  LDL goal is less than 70.  Will obtain most recent lipid panel from this past month and recommend to adjust dose accordingly.   4.  Continue blood pressure control 5.  I will refer him to vascular surgery for evaluation for possible surgical intervention of symptomatic right carotid artery stenosis. 6.  Recommend exercise and adhering to Mediterranean diet 7.  Follow up in 3 months.  Thank you for allowing me to take part in the care of this patient.  Shon Millet, DO  CC:  Arvilla Meres, MD  Ian Malkin, FNP-C

## 2017-12-26 ENCOUNTER — Ambulatory Visit (INDEPENDENT_AMBULATORY_CARE_PROVIDER_SITE_OTHER): Payer: BLUE CROSS/BLUE SHIELD | Admitting: Neurology

## 2017-12-26 ENCOUNTER — Encounter: Payer: Self-pay | Admitting: Neurology

## 2017-12-26 ENCOUNTER — Telehealth (HOSPITAL_COMMUNITY): Payer: Self-pay | Admitting: *Deleted

## 2017-12-26 ENCOUNTER — Encounter (HOSPITAL_COMMUNITY): Payer: Self-pay | Admitting: *Deleted

## 2017-12-26 VITALS — BP 112/78 | HR 64 | Ht 63.0 in | Wt 198.0 lb

## 2017-12-26 DIAGNOSIS — I10 Essential (primary) hypertension: Secondary | ICD-10-CM | POA: Diagnosis not present

## 2017-12-26 DIAGNOSIS — G459 Transient cerebral ischemic attack, unspecified: Secondary | ICD-10-CM

## 2017-12-26 DIAGNOSIS — I6522 Occlusion and stenosis of left carotid artery: Secondary | ICD-10-CM | POA: Diagnosis not present

## 2017-12-26 DIAGNOSIS — E785 Hyperlipidemia, unspecified: Secondary | ICD-10-CM

## 2017-12-26 NOTE — Patient Instructions (Addendum)
1. Stop Plavix due to bleeding.  Continue aspirin 81mg  daily.  If bleeding in the stool doesn't improve over the next week, then follow up with Dr. Rhea Belton. 2.  We will check MRI and MRA of brain 3.  Continue Lipitor 40mg  daily for now.  I will review the cholesterol labs and let you know if it needs to be changed. 4.  Continue blood pressure medication 5.  Stop soda.  Follow Mediterranean diet (See below) 6.  Start exercise 7.  Referral to vascular surgery regarding carotid artery stenosis (should be scheduled for after MRI) 8.  Follow up with me in 3 months.  We have sent a referral to [facility] for your MRI/MRA and they will call you directly to schedule your appt. They are located at [address] [facility]. If you need to contact them directly please call [phone].  Mediterranean Diet A Mediterranean diet refers to food and lifestyle choices that are based on the traditions of countries located on the [employer]. This way of eating has been shown to help prevent certain conditions and improve outcomes for people who have chronic diseases, like kidney disease and heart disease. What are tips for following this plan? Lifestyle  Cook and eat meals together with your family, when possible.  Drink enough fluid to keep your urine clear or pale yellow.  Be physically active every day. This includes: ? Aerobic exercise like running or swimming. ? Leisure activities like gardening, walking, or housework.  Get 7-8 hours of sleep each night.  If recommended by your health care provider, drink red wine in moderation. This means 1 glass a day for nonpregnant women and 2 glasses a day for men. A glass of wine equals 5 oz (150 mL). Reading food labels  Check the serving size of packaged foods. For foods such as rice and pasta, the serving size refers to the amount of cooked product, not dry.  Check the total fat in packaged foods. Avoid foods that have saturated fat or trans  fats.  Check the ingredients list for added sugars, such as corn syrup. Shopping  At the grocery store, buy most of your food from the areas near the walls of the store. This includes: ? Fresh fruits and vegetables (produce). ? Grains, beans, nuts, and seeds. Some of these may be available in unpackaged forms or large amounts (in bulk). ? Fresh seafood. ? Poultry and eggs. ? Low-fat dairy products.  Buy whole ingredients instead of prepackaged foods.  Buy fresh fruits and vegetables in-season from local farmers markets.  Buy frozen fruits and vegetables in resealable bags.  If you do not have access to quality fresh seafood, buy precooked frozen shrimp or canned fish, such as tuna, salmon, or sardines.  Buy small amounts of raw or cooked vegetables, salads, or olives from the deli or salad bar at your store.  Stock your pantry so you always have certain foods on hand, such as olive oil, canned tuna, canned tomatoes, rice, pasta, and beans. Cooking  Cook foods with extra-virgin olive oil instead of using butter or other vegetable oils.  Have meat as a side dish, and have vegetables or grains as your main dish. This means having meat in small portions or adding small amounts of meat to foods like pasta or stew.  Use beans or vegetables instead of meat in common dishes like chili or lasagna.  Experiment with different cooking methods. Try roasting or broiling vegetables instead of steaming or sauteing them.  Add frozen vegetables to soups, stews, pasta, or rice.  Add nuts or seeds for added healthy fat at each meal. You can add these to yogurt, salads, or vegetable dishes.  Marinate fish or vegetables using olive oil, lemon juice, garlic, and fresh herbs. Meal planning  Plan to eat 1 vegetarian meal one day each week. Try to work up to 2 vegetarian meals, if possible.  Eat seafood 2 or more times a week.  Have healthy snacks readily available, such as: ? Vegetable sticks  with hummus. ? [place] yogurt. ? Fruit and nut trail mix.  Eat balanced meals throughout the week. This includes: ? Fruit: 2-3 servings a day ? Vegetables: 4-5 servings a day ? Low-fat dairy: 2 servings a day ? Fish, poultry, or lean meat: 1 serving a day ? Beans and legumes: 2 or more servings a week ? Nuts and seeds: 1-2 servings a day ? Whole grains: 6-8 servings a day ? Extra-virgin olive oil: 3-4 servings a day  Limit red meat and sweets to only a few servings a month What are my food choices?  Mediterranean diet ? Recommended ? Grains: Whole-grain pasta. Brown rice. Bulgar wheat. Polenta. Couscous. Whole-wheat bread. Orpah Cobb. ? Vegetables: Artichokes. Beets. Broccoli. Cabbage. Carrots. Eggplant. Green beans. Chard. Kale. Spinach. Onions. Leeks. Peas. Squash. Tomatoes. Peppers. Radishes. ? Fruits: Apples. Apricots. Avocado. Berries. Bananas. Cherries. Dates. Figs. Grapes. Lemons. Melon. Oranges. Peaches. Plums. Pomegranate. ? Meats and other protein foods: Beans. Almonds. Sunflower seeds. Pine nuts. Peanuts. Cod. Salmon. Scallops. Shrimp. Tuna. Tilapia. Clams. Oysters. Eggs. ? Dairy: Low-fat milk. Cheese. Greek yogurt. ? Beverages: Water. Red wine. Herbal tea. ? Fats and oils: Extra virgin olive oil. Avocado oil. Grape seed oil. ? Sweets and desserts: [place] yogurt with honey. Baked apples. Poached pears. Trail mix. ? Seasoning and other foods: Basil. Cilantro. Coriander. Cumin. Mint. Parsley. Sage. Rosemary. Tarragon. Garlic. Oregano. Thyme. Pepper. Balsalmic vinegar. Tahini. Hummus. Tomato sauce. Olives. Mushrooms. ? Limit these ? Grains: Prepackaged pasta or rice dishes. Prepackaged cereal with added sugar. ? Vegetables: Deep fried potatoes (french fries). ? Fruits: Fruit canned in syrup. ? Meats and other protein foods: Beef. Pork. Lamb. Poultry with skin. Hot dogs. Tomasa Blase. ? Dairy: Ice cream. Sour cream. Whole milk. ? Beverages: Juice. Sugar-sweetened soft drinks.  Beer. Liquor and spirits. ? Fats and oils: Butter. Canola oil. Vegetable oil. Beef fat (tallow). Lard. ? Sweets and desserts: Cookies. Cakes. Pies. Candy. ? Seasoning and other foods: Mayonnaise. Premade sauces and marinades. ? The items listed may not be a complete list. Talk with your dietitian about what dietary choices are right for you. Summary  The Mediterranean diet includes both food and lifestyle choices.  Eat a variety of fresh fruits and vegetables, beans, nuts, seeds, and whole grains.  Limit the amount of red meat and sweets that you eat.  Talk with your health care provider about whether it is safe for you to drink red wine in moderation. This means 1 glass a day for nonpregnant women and 2 glasses a day for men. A glass of wine equals 5 oz (150 mL). This information is not intended to replace advice given to you by your health care provider. Make sure you discuss any questions you have with your health care provider. Document Released: 11/30/2015 Document Revised: 01/02/2016 Document Reviewed: 11/30/2015 Elsevier Interactive Patient Education  [employer].

## 2017-12-26 NOTE — Telephone Encounter (Signed)
Notes recorded by Noralee Space, RN on 12/26/2017 at 2:47 PM EDT Pt aware, agreeable and verbalized understanding, med list updated ------  Notes recorded by Bensimhon, Bevelyn Buckles, MD on 12/25/2017 at 10:40 PM EDT Can stop plavix. F/u with GI if not improved ------  Notes recorded by Noralee Space, RN on 12/25/2017 at 4:54 PM EDT Pt aware of results. He states he has started having rectal bleeding again since being back on ASA and Plavix, he states their is some blood every time he has a bowel movement, has seen Dr Rhea Belton for this in the past. Would like Dr Gala Romney recommendation, should he continue both meds or stop one, will send to Dr Gala Romney for further review. ------  Notes recorded by Dolores Patty, MD on 12/24/2017 at 11:51 PM EDT EF stable.

## 2017-12-29 ENCOUNTER — Other Ambulatory Visit: Payer: Self-pay

## 2017-12-29 DIAGNOSIS — I6523 Occlusion and stenosis of bilateral carotid arteries: Secondary | ICD-10-CM

## 2017-12-31 ENCOUNTER — Telehealth (HOSPITAL_COMMUNITY): Payer: Self-pay | Admitting: Cardiology

## 2017-12-31 ENCOUNTER — Encounter (HOSPITAL_COMMUNITY): Payer: Self-pay | Admitting: Cardiology

## 2017-12-31 NOTE — Telephone Encounter (Signed)
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 Per supervisor letter must include LVEF  Letter updated and emailed at patient per his request. [email or web address]

## 2018-01-05 ENCOUNTER — Other Ambulatory Visit: Payer: Self-pay

## 2018-01-05 ENCOUNTER — Other Ambulatory Visit: Payer: Self-pay | Admitting: Neurology

## 2018-01-05 DIAGNOSIS — T1590XA Foreign body on external eye, part unspecified, unspecified eye, initial encounter: Secondary | ICD-10-CM

## 2018-01-06 ENCOUNTER — Other Ambulatory Visit: Payer: Self-pay

## 2018-01-06 DIAGNOSIS — I6523 Occlusion and stenosis of bilateral carotid arteries: Secondary | ICD-10-CM

## 2018-01-12 ENCOUNTER — Encounter (HOSPITAL_COMMUNITY): Payer: BLUE CROSS/BLUE SHIELD | Admitting: Internal Medicine

## 2018-01-15 ENCOUNTER — Other Ambulatory Visit: Payer: Self-pay

## 2018-01-19 ENCOUNTER — Telehealth: Payer: Self-pay

## 2018-01-19 ENCOUNTER — Ambulatory Visit (HOSPITAL_COMMUNITY)
Admission: RE | Admit: 2018-01-19 | Discharge: 2018-01-19 | Disposition: A | Discharge status: Home / Self Care | Payer: BLUE CROSS/BLUE SHIELD | Source: Ambulatory Visit | Attending: Vascular Surgery | Admitting: Vascular Surgery

## 2018-01-19 ENCOUNTER — Encounter: Payer: Self-pay | Admitting: Vascular Surgery

## 2018-01-19 ENCOUNTER — Telehealth: Payer: Self-pay | Admitting: Neurology

## 2018-01-19 ENCOUNTER — Ambulatory Visit (INDEPENDENT_AMBULATORY_CARE_PROVIDER_SITE_OTHER): Payer: BLUE CROSS/BLUE SHIELD | Admitting: Vascular Surgery

## 2018-01-19 VITALS — BP 149/93 | HR 55 | Temp 96.9°F | Resp 20 | Ht 63.0 in | Wt 201.0 lb

## 2018-01-19 DIAGNOSIS — I6522 Occlusion and stenosis of left carotid artery: Secondary | ICD-10-CM

## 2018-01-19 DIAGNOSIS — I6523 Occlusion and stenosis of bilateral carotid arteries: Secondary | ICD-10-CM | POA: Diagnosis not present

## 2018-01-19 DIAGNOSIS — G459 Transient cerebral ischemic attack, unspecified: Secondary | ICD-10-CM

## 2018-01-19 DIAGNOSIS — I110 Hypertensive heart disease with heart failure: Secondary | ICD-10-CM | POA: Diagnosis not present

## 2018-01-19 DIAGNOSIS — I509 Heart failure, unspecified: Secondary | ICD-10-CM | POA: Diagnosis not present

## 2018-01-19 DIAGNOSIS — E785 Hyperlipidemia, unspecified: Secondary | ICD-10-CM | POA: Diagnosis not present

## 2018-01-19 NOTE — Telephone Encounter (Addendum)
Received call from [facility] with Dr Lacinda Axon office. Dr Arbie Cookey saw Pt, Pt has MRA ordered, Becky states Dr Early felt the MRA would be worthless and would prefer the Pt to have CTA of the head and neck. I spoke with Dr Everlena Cooper. It is alright with him if it is changed from MRA to CTA  Called [place], spoke with [facility]

## 2018-01-19 NOTE — Progress Notes (Signed)
Vascular and Vein Specialist of [place]  Patient name: [name] [name] MRN: [id number] DOB: May 14, 1960 Sex: male  REASON FOR CONSULT: Evaluation of symptomatic right carotid stenosis  Seen today in our [place] office  HPI: [name] [name] is a 57 y.o. male, who is seen today for discussion of recent neurologic deficit.  He reported an episode on August 1 of left upper extremity numbness radiating to the left side face lasted less than 1 minute.  He had a similar episode later that evening.  The following day he had an episode of left lower extremity numbness and tingling lasting for several minutes.  He reported to his primary care provider in [place] on 11/24/2017.  Carotid ultrasound at that time revealed 50 to 69% stenosis in the right carotid and less than 50% stenosis in the left carotid artery.  He also was found to have a slightly elevated troponin and was sent to the [facility] emergency department where further work-up was negative.  He does have a history of known cardiomyopathy.  He was seen by Dr. Tomi Likens with neurology and was referred for further evaluation with apparent symptomatic right carotid stenosis.  He has not had an MRI or any other more advanced imaging.  He did undergo a repeat carotid duplex at [facility].  Past Medical History:  Diagnosis Date  . Chest pain     -Lexiscan Myoview 3/12: ? small apical lateral ischemia, EF 53%; similar to study in 2/11  . Erectile dysfunction   . GERD (gastroesophageal reflux disease)   . HOCM (hypertrophic obstructive cardiomyopathy) ([place])     --cardiac cath 2007. nomrmal cors;  s/p myomectomy 2008;  Echo 3/12: mod to severe LVH, HCM, EF 65%, resting LVOT peak 2.21 m/s, mild LAE, mild MR, AV mean 9;  Echo 12/13: severe LVH, EF 80%, Gr 2 diast dsyfn, mild LAE, mild RVE  . HTN (hypertension)   . Hyperlipidemia     Family History  Problem Relation Age of Onset  .  Non-Hodgkin's lymphoma Father   . Bladder Cancer Father   . Multiple myeloma Mother   . Kidney failure Mother   . Autism Son     SOCIAL HISTORY: Social History   Socioeconomic History  . Marital status: Married    Spouse name: Alice  . Number of children: 5  . Years of education: Not on file  . Highest education level: 12th grade  Occupational History  . Occupation: route Diplomatic Services operational officer: [employer]  . Financial resource strain: Not on file  . Food insecurity:    Worry: Not on file    Inability: Not on file  . Transportation needs:    Medical: Not on file    Non-medical: Not on file  Tobacco Use  . Smoking status: Never Smoker  . Smokeless tobacco: Never Used  Substance and Sexual Activity  . Alcohol use: No    Frequency: Never  . Drug use: No  . Sexual activity: Not on file  Lifestyle  . Physical activity:    Days per week: Not on file    Minutes per session: Not on file  . Stress: Not on file  Relationships  . Social connections:    Talks on phone: Not on file    Gets together: Not on file    Attends religious service: Not on file    Active member of club or organization: Not on file    Attends  meetings of clubs or organizations: Not on file    Relationship status: Not on file  . Intimate partner violence:    Fear of current or ex partner: Not on file    Emotionally abused: Not on file    Physically abused: Not on file    Forced sexual activity: Not on file  Other Topics Concern  . Not on file  Social History Narrative   Patient is right-handed. He lives with his wife in a 2 story house. He drinks 2 cups of coffee a day, and until 2 weeks ago, he drank several Mt. Dew's a day. He is very active at work.    No Known Allergies  Current Outpatient Medications  Medication Sig Dispense Refill  . aspirin 81 MG tablet Take 4 tablets (325 mg total) by mouth daily.    Marland Kitchen atorvastatin (LIPITOR) 40 MG tablet Take 1 tablet (40 mg total) by mouth  daily. 30 tablet 6  . hydrochlorothiazide (HYDRODIURIL) 25 MG tablet TAKE 1 TABLET DAILY 90 tablet 0  . losartan (COZAAR) 100 MG tablet TAKE 1 TABLET DAILY 90 tablet 3  . metoprolol succinate (TOPROL-XL) 100 MG 24 hr tablet TAKE 1 TABLET TWICE A DAY, TAKE WITH OR IMMEDIATELY FOLLOWING A MEAL 180 tablet 0   No current facility-administered medications for this visit.     REVIEW OF SYSTEMS:  '[X]'  denotes positive finding, '[ ]'  denotes negative finding Cardiac  Comments:  Chest pain or chest pressure:    Shortness of breath upon exertion:    Short of breath when lying flat:    Irregular heart rhythm:        Vascular    Pain in calf, thigh, or hip brought on by ambulation:    Pain in feet at night that wakes you up from your sleep:     Blood clot in your veins:    Leg swelling:         Pulmonary    Oxygen at home:    Productive cough:     Wheezing:         Neurologic    Sudden weakness in arms or legs:     Sudden numbness in arms or legs:  x   Sudden onset of difficulty speaking or slurred speech:    Temporary loss of vision in one eye:     Problems with dizziness:         Gastrointestinal    Blood in stool:     Vomited blood:         Genitourinary    Burning when urinating:     Blood in urine:        Psychiatric    Major depression:         Hematologic    Bleeding problems:    Problems with blood clotting too easily:        Skin    Rashes or ulcers:        Constitutional    Fever or chills:      PHYSICAL EXAM: Vitals:   01/19/18 1305 01/19/18 1306  BP: (!) 152/90 (!) 149/93  Pulse: 60 (!) 55  Resp: 20   Temp: (!) 96.9 F (36.1 C)   TempSrc: Temporal   Weight: 201 lb (91.2 kg)   Height: '5\' 3"'  (1.6 m)     GENERAL: The patient is a well-nourished male, in no acute distress. The vital signs are documented above. CARDIOVASCULAR: Carotid arteries are without bruits bilaterally.  2+ radial  and 2+ dorsalis pedis pulses bilaterally PULMONARY: There is good air  exchange  ABDOMEN: Soft and non-tender  MUSCULOSKELETAL: There are no major deformities or cyanosis. NEUROLOGIC: No focal weakness or paresthesias are detected. SKIN: There are no ulcers or rashes noted. PSYCHIATRIC: The patient has a normal affect.  DATA:  He did undergo repeat carotid duplex at [facility] today and I have these results for review.  This showed no evidence of significant stenosis on either right or left carotid artery.  MEDICAL ISSUES: I discussed these findings and the significance of detail with the patient and his wife present.  I did explain that there is a discrepancy between findings at [facility] and our studies today at [facility].  I explained that if he indeed had a 50 to 70% stenosis in his right carotid that we would recommend endarterectomy for reduction of stroke risk with the presumption being this was the cause of his right brain event.  Reports that he is to have repeat imaging at [facility] radiology on Thursday of this week.  It sounds like this is an MRI and probably a CT angiogram of his neck as well.  We will confirm that he is obtaining a CT angiogram later this week and will discuss this further with him following.  I did explain that it is potential that the initial ultrasound was an over read or that today's was an under read of his level of stenosis.  Explained that we would have more information from the CT imaging and would make final recommendation based on this.  I did explain in detail what is involved with a carotid endarterectomy including potential risk.  We will discuss this with him further after his CT angiogram   Rosetta Posner, MD [facility] Vascular and Vein Specialists of [facility] Tel [phone] Pager [phone]

## 2018-01-19 NOTE — Telephone Encounter (Signed)
Ferdinand Lango is calling in on this patient and left a voicemail that she was needing to speak with the nurse. I think she is calling from Dr.Tot's office in . She stated the patient was seen there this morning. Please call her back at [phone]. Thanks!

## 2018-01-19 NOTE — Telephone Encounter (Signed)
I have attempted to contact Becky x 3 and the phone number is not correct.

## 2018-01-20 ENCOUNTER — Telehealth: Payer: Self-pay | Admitting: Vascular Surgery

## 2018-01-20 NOTE — Telephone Encounter (Signed)
Encounter created on wrong patient

## 2018-01-20 NOTE — Telephone Encounter (Deleted)
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  He will have his office contact Mr. [name] [name] for office visit

## 2018-01-22 ENCOUNTER — Other Ambulatory Visit: Payer: BLUE CROSS/BLUE SHIELD

## 2018-01-22 ENCOUNTER — Telehealth: Payer: Self-pay | Admitting: Neurology

## 2018-01-22 NOTE — Telephone Encounter (Signed)
Patient's wife called in stating she needed the Prior Auth done for the patient's CT Scan from heart to neck to look at arteries. This afternoon at 2:30PM is when it is scheduled for. The number to call back is [phone]. Thanks!

## 2018-01-22 NOTE — Telephone Encounter (Signed)
Called [facility] for rtrn call

## 2018-01-26 NOTE — Telephone Encounter (Signed)
Called [phone], VM is full, called [phone] number, spoke with Pt, advised him I have sent a message to the person who handles our PA's and I expect a response by tomorrow at the latest. I called [facility], spoke with Inna [id number] CTA neck [id number] CTA head Approved PA #[id number] valid 01/22/18-02/20/18  Called [facility] advising of PA# and vaid dates, am also faxing to [facility]

## 2018-02-05 ENCOUNTER — Ambulatory Visit
Admission: RE | Admit: 2018-02-05 | Discharge: 2018-02-05 | Disposition: A | Discharge status: Home / Self Care | Payer: BLUE CROSS/BLUE SHIELD | Source: Ambulatory Visit | Attending: Neurology | Admitting: Neurology

## 2018-02-05 DIAGNOSIS — G459 Transient cerebral ischemic attack, unspecified: Secondary | ICD-10-CM

## 2018-02-05 DIAGNOSIS — I6522 Occlusion and stenosis of left carotid artery: Secondary | ICD-10-CM

## 2018-02-05 MED ORDER — IOPAMIDOL (ISOVUE-370) INJECTION 76%
75.0000 mL | Freq: Once | INTRAVENOUS | Status: AC | PRN
  Administered 2018-02-05: 75 mL via INTRAVENOUS

## 2018-02-06 ENCOUNTER — Telehealth: Payer: Self-pay | Admitting: *Deleted

## 2018-02-06 NOTE — Telephone Encounter (Signed)
Called patient and gave him the results.  

## 2018-02-06 NOTE — Telephone Encounter (Signed)
-----   Message from Vivien Rota, LPN sent at 16/01/9603 10:15 AM EDT -----   ----- Message ----- From: Drema Dallas, DO Sent: 02/06/2018   6:50 AM EDT To: Dorthy Cooler, CMA  CTA of head and neck reviewed.  No significant narrowing of arteries int he brain.  The narrowing of the right carotid artery does not appear to be as severe as initially read on prior ultrasound.  I have routed the results to Dr. Arbie Cookey for his review and recommendation.

## 2018-02-09 ENCOUNTER — Telehealth: Payer: Self-pay | Admitting: Vascular Surgery

## 2018-02-09 ENCOUNTER — Other Ambulatory Visit (HOSPITAL_COMMUNITY): Payer: Self-pay | Admitting: Internal Medicine

## 2018-02-09 ENCOUNTER — Other Ambulatory Visit: Payer: Self-pay | Admitting: *Deleted

## 2018-02-09 NOTE — Telephone Encounter (Signed)
I spoke with Mr. Marcott and his wife by telephone.  Reviewed the results of the CT angiogram from last week.  Explained that he had approximately 50% stenosis with extreme irregularity and severe ulceration.  I suspect that this is the most likely cause of his right brain event.  Fortunately he has had no new neurologic deficits.  I have recommended endarterectomy for reduction of stroke risk.  I did describe the procedure in detail during our office visit and also reviewed this with him by telephone.  Explained to 1 to 2% risk of stroke with surgery.  They wish to proceed and we will coordinate this around his work schedule.

## 2018-02-09 NOTE — Progress Notes (Signed)
Call to patient instructed to be at [facility] admitting at 6:30 am on 02/25/2018 (patient's choice) for surgery. NPO past MN night prior to continue ASA. Expect a call and follow the detailed instructions received from the hospital pre-admission department for this surgery. Verbalized understanding and to call this office for questions.

## 2018-02-19 NOTE — Pre-Procedure Instructions (Signed)
[name] [name]  02/19/2018      [id number] [place] - [address] AT [address] [place] [address] Phone: [phone] Fax: [phone]    Your procedure is scheduled on Wednesday November 6th.  Report to [facility] Admitting at 6:30 A.M.  Call this number if you have problems the morning of surgery:  [phone]   Remember:  Do not eat or drink after midnight.     Take these medicines the morning of surgery with A SIP OF WATER metoprolol succinate (TOPROL-XL)   Follow your surgeon's instructions on when to stop Asprin.  If no instructions were given by your surgeon then you will need to call the office to get those instructions.    7 days prior to surgery STOP taking any Aspirin(unless otherwise instructed by your surgeon), Aleve, Naproxen, Ibuprofen, Motrin, Advil, Goody's, BC's, all herbal medications, fish oil, and all vitamins    Do not wear jewelry.  Do not wear lotions, powders, or colognes, or deodorant.  Do not shave 48 hours prior to surgery.  Men may shave face and neck.  Do not bring valuables to the hospital.  [facility] is not responsible for any belongings or valuables.  Contacts, eyeglasses, hearing aids, dentures or bridgework may not be worn into surgery.  Leave your suitcase in the car.  After surgery it may be brought to your room.  For patients admitted to the hospital, discharge time will be determined by your treatment team.  Patients discharged the day of surgery will not be allowed to drive home.    [place]- Preparing For Surgery  Before surgery, you can play an important role. Because skin is not sterile, your skin needs to be as free of germs as possible. You can reduce the number of germs on your skin by washing with CHG (chlorahexidine gluconate) Soap before surgery.  CHG is an antiseptic cleaner which kills germs and bonds with the skin to continue killing germs even  after washing.    Oral Hygiene is also important to reduce your risk of infection.  Remember - BRUSH YOUR TEETH THE MORNING OF SURGERY WITH YOUR REGULAR TOOTHPASTE  Please do not use if you have an allergy to CHG or antibacterial soaps. If your skin becomes reddened/irritated stop using the CHG.  Do not shave (including legs and underarms) for at least 48 hours prior to first CHG shower. It is OK to shave your face.  Please follow these instructions carefully.   1. Shower the NIGHT BEFORE SURGERY and the MORNING OF SURGERY with CHG.   2. If you chose to wash your hair, wash your hair first as usual with your normal shampoo.  3. After you shampoo, rinse your hair and body thoroughly to remove the shampoo.  4. Use CHG as you would any other liquid soap. You can apply CHG directly to the skin and wash gently with a scrungie or a clean washcloth.   5. Apply the CHG Soap to your body ONLY FROM THE NECK DOWN.  Do not use on open wounds or open sores. Avoid contact with your eyes, ears, mouth and genitals (private parts). Wash Face and genitals (private parts)  with your normal soap.  6. Wash thoroughly, paying special attention to the area where your surgery will be performed.  7. Thoroughly rinse your body with warm water from the neck down.  8. DO NOT shower/wash with your normal soap after  using and rinsing off the CHG Soap.  9. Pat yourself dry with a CLEAN TOWEL.  10. Wear CLEAN PAJAMAS to bed the night before surgery, wear comfortable clothes the morning of surgery  11. Place CLEAN SHEETS on your bed the night of your first shower and DO NOT SLEEP WITH PETS.    Day of Surgery: Shower as stated above. Do not apply any deodorants/lotions.  Please wear clean clothes to the hospital/surgery center.   Remember to brush your teeth WITH YOUR REGULAR TOOTHPASTE.   Please read over the following fact sheets that you were given.

## 2018-02-20 ENCOUNTER — Encounter (HOSPITAL_COMMUNITY): Payer: Self-pay

## 2018-02-20 ENCOUNTER — Encounter (HOSPITAL_COMMUNITY)
Admission: RE | Admit: 2018-02-20 | Discharge: 2018-02-20 | Disposition: A | Discharge status: Home / Self Care | Payer: BLUE CROSS/BLUE SHIELD | Source: Ambulatory Visit | Attending: Vascular Surgery | Admitting: Vascular Surgery

## 2018-02-20 ENCOUNTER — Other Ambulatory Visit: Payer: Self-pay

## 2018-02-20 DIAGNOSIS — K219 Gastro-esophageal reflux disease without esophagitis: Secondary | ICD-10-CM | POA: Insufficient documentation

## 2018-02-20 DIAGNOSIS — Z79899 Other long term (current) drug therapy: Secondary | ICD-10-CM | POA: Diagnosis not present

## 2018-02-20 DIAGNOSIS — E785 Hyperlipidemia, unspecified: Secondary | ICD-10-CM | POA: Insufficient documentation

## 2018-02-20 DIAGNOSIS — I10 Essential (primary) hypertension: Secondary | ICD-10-CM | POA: Insufficient documentation

## 2018-02-20 DIAGNOSIS — Z7982 Long term (current) use of aspirin: Secondary | ICD-10-CM | POA: Insufficient documentation

## 2018-02-20 DIAGNOSIS — I421 Obstructive hypertrophic cardiomyopathy: Secondary | ICD-10-CM | POA: Insufficient documentation

## 2018-02-20 DIAGNOSIS — I6521 Occlusion and stenosis of right carotid artery: Secondary | ICD-10-CM | POA: Insufficient documentation

## 2018-02-20 DIAGNOSIS — Z01818 Encounter for other preprocedural examination: Secondary | ICD-10-CM | POA: Diagnosis not present

## 2018-02-20 LAB — URINALYSIS, ROUTINE W REFLEX MICROSCOPIC
BILIRUBIN URINE: NEGATIVE
Glucose, UA: NEGATIVE mg/dL
Hgb urine dipstick: NEGATIVE
Ketones, ur: NEGATIVE mg/dL
LEUKOCYTES UA: NEGATIVE
NITRITE: NEGATIVE
PROTEIN: NEGATIVE mg/dL
Specific Gravity, Urine: 1.018 (ref 1.005–1.030)
pH: 6 (ref 5.0–8.0)

## 2018-02-20 LAB — CBC
HEMATOCRIT: 44.3 % (ref 39.0–52.0)
Hemoglobin: 14.4 g/dL (ref 13.0–17.0)
MCH: 30.4 pg (ref 26.0–34.0)
MCHC: 32.5 g/dL (ref 30.0–36.0)
MCV: 93.7 fL (ref 80.0–100.0)
NRBC: 0 % (ref 0.0–0.2)
PLATELETS: 223 10*3/uL (ref 150–400)
RBC: 4.73 MIL/uL (ref 4.22–5.81)
RDW: 11.9 % (ref 11.5–15.5)
WBC: 7.3 10*3/uL (ref 4.0–10.5)

## 2018-02-20 LAB — TYPE AND SCREEN
ABO/RH(D): A POS
ANTIBODY SCREEN: NEGATIVE

## 2018-02-20 LAB — COMPREHENSIVE METABOLIC PANEL
ALBUMIN: 4.4 g/dL (ref 3.5–5.0)
ALT: 26 U/L (ref 0–44)
AST: 24 U/L (ref 15–41)
Alkaline Phosphatase: 51 U/L (ref 38–126)
Anion gap: 6 (ref 5–15)
BUN: 19 mg/dL (ref 6–20)
CHLORIDE: 102 mmol/L (ref 98–111)
CO2: 29 mmol/L (ref 22–32)
CREATININE: 1.12 mg/dL (ref 0.61–1.24)
Calcium: 8.8 mg/dL — ABNORMAL LOW (ref 8.9–10.3)
GFR calc Af Amer: >60 mL/min (ref >60)
GFR calc non Af Amer: >60 mL/min (ref >60)
GLUCOSE: 88 mg/dL (ref 70–99)
POTASSIUM: 3.9 mmol/L (ref 3.5–5.1)
Sodium: 137 mmol/L (ref 135–145)
Total Bilirubin: 1.1 mg/dL (ref 0.3–1.2)
Total Protein: 7.6 g/dL (ref 6.5–8.1)

## 2018-02-20 LAB — SURGICAL PCR SCREEN
MRSA, PCR: NEGATIVE
STAPHYLOCOCCUS AUREUS: NEGATIVE

## 2018-02-20 LAB — PROTIME-INR
INR: 1.08
Prothrombin Time: 13.9 seconds (ref 11.4–15.2)

## 2018-02-20 LAB — APTT: aPTT: 28 seconds (ref 24–36)

## 2018-02-20 LAB — ABO/RH: ABO/RH(D): A POS

## 2018-02-20 NOTE — Progress Notes (Signed)
PCP - Uses [facility] in [place], [place]. Usually a doctor on call.  Cardiologist - Dr. Gala Romney   Chest x-ray - 11/26/17 EKG - 12/18/17 Stress Test - 03/30/14 ECHO - 12/24/17 Cardiac Cath -04/14/2006   Sleep Study - denies  Aspirin Instructions: continue ASA until DOS.   Anesthesia review: Yes  Patient denies shortness of breath, fever, cough and chest pain at PAT appointment   Patient verbalized understanding of instructions that were given to them at the PAT appointment. Patient was also instructed that they will need to review over the PAT instructions again at home before surgery.

## 2018-02-23 NOTE — Progress Notes (Signed)
Anesthesia Chart Review:  Case:  [id number] Date/Time:  02/25/18 0815   Procedure:  ENDARTERECTOMY CAROTID RIGHT (Right )   Anesthesia type:  General   Pre-op diagnosis:  RIGHT CAROTID ARTERY STENOSIS   Location:  [facility] OR ROOM 11 / [facility] OR   Surgeon:  Larina Earthly, MD      DISCUSSION: Patient is a 57 year old male scheduled for the above procedure.  History includes never smoker, hypertrophic obstructive cardiomyopathy (HOCM; s/p septal myomectomy 05/21/06 [facility], Judd Gaudier, MD), HTN, HLD, GERD. BMI is consistent with obesity. - 11/20/17 he had two transient episodes of LUE and facial numbness followed by LLE N/T on 11/21/17. He was seen by a medical provider on 11/24/17 who ordered labs work including troponin that was elevated at 0.06 and was referred to [facility]. Repeat troponin 0.05, and he was discharged home with neurology and cardiology follow-up.   He was seen by cardiologist Dr. Gala Romney on 12/18/17. Patient remaining very active without any cardiac symptoms and felt to be doing well from a cardiac standpoint. However, left sided neurologic symptoms concerning for TIA. Carotid U/S showed RICA 50-69% stenosis. Echo, MRI/MRA brain recommended (later changed to CTA per vascular surgery recommendations) and neurology follow-up. Plavix was also started, but changed to ASA 81 mg only due to rectal bleeding (and referred to GI if not improved). Neurologist Dr. Everlena Cooper referred patient to vascular surgery. Although neck CTA only showed about 50% RICA stenosis there was extreme irregularity and severe ulceration that Dr. Arbie Cookey suspected to be the most likely cause of his right brain events. Right CEA recommended for stroke risk reduction.  If no acute changes then I would anticipate that he can proceed as planned.   VS: BP 121/85   Pulse 62   Temp 36.8 C   Resp 20   Ht 5\' 3"  (1.6 m)   Wt 89 kg   SpO2 98%   BMI 34.77 kg/m    PROVIDERS: Patient, No Pcp Per. Uses [facility] in [place], [place] and will see an  on-call provider when needed.  Arvilla Meres, MD is cardiologist. Last visit 12/18/17. In regarding to HOCM: "Doing very well. Asymptomatic. ETT in 12/15 with excellent exercise tolerance. No arrhythmias and good BP response to exercise. He has not had any high-risk features for SCD and does not meet cirteria ICD." Shon Millet, DO is neurologist Erick Blinks, MD is GI. Last visit 04/04/17. Reported screening colonoscopy in [place] at age 2 and was told next colonoscopy due in 10 years. Dr. Rhea Belton was attempting to get records and discuss more at patient's next follow-up.   LABS: Labs reviewed: Acceptable for surgery. and Labs reviewed: Repeat  (all labs ordered are listed, but only abnormal results are displayed)  Labs Reviewed  COMPREHENSIVE METABOLIC PANEL - Abnormal; Notable for the following components:      Result Value   Calcium 8.8 (*)    All other components within normal limits  SURGICAL PCR SCREEN  APTT  CBC  PROTIME-INR  URINALYSIS, ROUTINE W REFLEX MICROSCOPIC  TYPE AND SCREEN  ABO/RH    IMAGES: CTA head/neck 02/05/18: IMPRESSION: 1. Soft plaque in the proximal right ICA with plaque ulceration and 50% stenosis. 2. No left sided carotid artery stenosis. 3. Widely patent vertebral arteries. 4. Intracranial atherosclerosis without major branch occlusion or significant stenosis.  CXR 11/26/17: IMPRESSION: Enlargement of cardiac silhouette post CABG with note of epicardial pacing wires. No acute abnormalities.   EKG: 12/18/17: SB at 58 bpm with first  degree AV block. LVH with QRS widening and repolarization abnormality. EKG stable when compared to 01/16/17 tracing.   CV: Echo 12/24/17: Study Conclusions - Left ventricle: No evidence of SAM or obstructive hypertrophic   DCM Apex is a bit hypertrophied but not as well seen on this   exam. The cavity size was normal. Wall thickness was increased in   a pattern of mild LVH. Systolic function was normal. The    estimated ejection fraction was in the range of 60% to 65%. Wall   motion was normal; there were no regional wall motion   abnormalities. Doppler parameters are consistent with both   elevated ventricular end-diastolic filling pressure and elevated   left atrial filling pressure. - Atrial septum: No defect or patent foramen ovale was identified.  ETT 03/30/14:  Summary: 11:52 min [name] Protocol. 13.4 METS Peak HR 141 (83% MPHR).  HR response to exercise: appropriate. BP response to exercise: Mild HTN response. Chest pain: None. Arrhythmias: PACs, PVCs.   RHC/LHC (pre-myomectomy) 04/14/06: - HEMODYNAMIC RESULTS:  Right atrial pressure mean of 6, RV pressure 53/2,  PA pressure 44/19 with a mean of 30.  Pulmonary capillary wedge pressure  20/18 with a mean of 16.  No apparent significant V-waves.  Central  aortic pressure was 113/77 with a mean of 95.  The proximal LV pressure  was 113/1 with an EDP of 14.  The distal LV pressure was 196/5 with an  EDP of 23.  On pullback across the LV outflow tract gradient and aortic  valve, there was a peak gradient of 83 with a mean of 49.  Fick cardiac  output was 3.1 liters per minute.  Cardiac index was 1.7 liters per  minute per meter squared.  Pulmonary vascular resistance was 4.6 Woods  units. - Normal coronary arteries. - Left ventriculogram done in the RAO position:  The ejection fraction was  hard to quantify due to significant ectopy.  It appeared to be 40-45%  with no regional wall motion abnormalities.  There was evidence of  mitral valve prolapse with probable 2-3+ mitral regurgitation. - Apparent 80 mm gradient across the left ventricular outflow tract without    any significant evidence of aortic stenosis or aortic valve calcification. - Decreased cardiac output. (He ultimately was referred to [facility] for septal myomectomy 05/21/06).   Past Medical History:  Diagnosis Date  . Chest pain     -Lexiscan Myoview 3/12: ? small apical  lateral ischemia, EF 53%; similar to study in 2/11  . Erectile dysfunction   . GERD (gastroesophageal reflux disease)   . HOCM (hypertrophic obstructive cardiomyopathy) ([facility])     --cardiac cath 2007. nomrmal cors;  s/p myomectomy 2008;  Echo 3/12: mod to severe LVH, HCM, EF 65%, resting LVOT peak 2.21 m/s, mild LAE, mild MR, AV mean 9;  Echo 12/13: severe LVH, EF 80%, Gr 2 diast dsyfn, mild LAE, mild RVE  . HTN (hypertension)   . Hyperlipidemia     Past Surgical History:  Procedure Laterality Date  . CARDIAC CATHETERIZATION    . MYOMECTOMY     january 2008 at [facility]     MEDICATIONS: . aspirin 81 MG tablet  . atorvastatin (LIPITOR) 40 MG tablet  . hydrochlorothiazide (HYDRODIURIL) 25 MG tablet  . losartan (COZAAR) 100 MG tablet  . metoprolol succinate (TOPROL-XL) 100 MG 24 hr tablet   No current facility-administered medications for this encounter.     Shonna Chock, PA-C [facility] Short Stay Center/Anesthesiology Phone [phone]  02/23/2018 11:01 AM

## 2018-02-23 NOTE — Anesthesia Preprocedure Evaluation (Addendum)
Anesthesia Evaluation  Patient identified by MRN, date of birth, ID band Patient awake    Reviewed: Allergy & Precautions, NPO status , Patient's Chart, lab work & pertinent test results  Airway Mallampati: II  TM Distance: >3 FB     Dental   Pulmonary neg pulmonary ROS,    breath sounds clear to auscultation       Cardiovascular hypertension,  Rhythm:Regular Rate:Normal     Neuro/Psych    GI/Hepatic Neg liver ROS, GERD  ,  Endo/Other  negative endocrine ROS  Renal/GU negative Renal ROS     Musculoskeletal   Abdominal   Peds  Hematology   Anesthesia Other Findings   Reproductive/Obstetrics                           Anesthesia Physical Anesthesia Plan  ASA: III  Anesthesia Plan: General   Post-op Pain Management:    Induction: Intravenous  PONV Risk Score and Plan: Ondansetron, Dexamethasone and Midazolam  Airway Management Planned: Oral ETT  Additional Equipment:   Intra-op Plan:   Post-operative Plan: Possible Post-op intubation/ventilation  Informed Consent: I have reviewed the patients History and Physical, chart, labs and discussed the procedure including the risks, benefits and alternatives for the proposed anesthesia with the patient or authorized representative who has indicated his/her understanding and acceptance.   Dental advisory given  Plan Discussed with: Anesthesiologist, CRNA and Surgeon  Anesthesia Plan Comments: (PAT note written 02/23/2018 by Shonna Chock, PA-C. )      Anesthesia Quick Evaluation

## 2018-02-25 ENCOUNTER — Encounter (HOSPITAL_COMMUNITY)
Admission: RE | Disposition: A | Discharge status: Home / Self Care | Payer: Self-pay | Source: Ambulatory Visit | Attending: Vascular Surgery

## 2018-02-25 ENCOUNTER — Other Ambulatory Visit: Payer: Self-pay

## 2018-02-25 ENCOUNTER — Inpatient Hospital Stay (HOSPITAL_COMMUNITY)
Admission: RE | Admit: 2018-02-25 | Discharge: 2018-02-26 | DRG: 038 | Disposition: A | Discharge status: Home / Self Care | Payer: BLUE CROSS/BLUE SHIELD | Source: Ambulatory Visit | Attending: Vascular Surgery | Admitting: Vascular Surgery

## 2018-02-25 ENCOUNTER — Inpatient Hospital Stay (HOSPITAL_COMMUNITY): Payer: BLUE CROSS/BLUE SHIELD | Admitting: Certified Registered Nurse Anesthetist

## 2018-02-25 ENCOUNTER — Encounter (HOSPITAL_COMMUNITY): Payer: Self-pay

## 2018-02-25 ENCOUNTER — Inpatient Hospital Stay (HOSPITAL_COMMUNITY): Payer: BLUE CROSS/BLUE SHIELD | Admitting: Physician Assistant

## 2018-02-25 DIAGNOSIS — K219 Gastro-esophageal reflux disease without esophagitis: Secondary | ICD-10-CM | POA: Diagnosis present

## 2018-02-25 DIAGNOSIS — Z79899 Other long term (current) drug therapy: Secondary | ICD-10-CM | POA: Diagnosis not present

## 2018-02-25 DIAGNOSIS — I10 Essential (primary) hypertension: Secondary | ICD-10-CM | POA: Diagnosis present

## 2018-02-25 DIAGNOSIS — Z7982 Long term (current) use of aspirin: Secondary | ICD-10-CM

## 2018-02-25 DIAGNOSIS — E785 Hyperlipidemia, unspecified: Secondary | ICD-10-CM | POA: Diagnosis present

## 2018-02-25 DIAGNOSIS — I6521 Occlusion and stenosis of right carotid artery: Principal | ICD-10-CM | POA: Diagnosis present

## 2018-02-25 DIAGNOSIS — I421 Obstructive hypertrophic cardiomyopathy: Secondary | ICD-10-CM | POA: Diagnosis present

## 2018-02-25 HISTORY — DX: Unspecified osteoarthritis, unspecified site: M19.90

## 2018-02-25 HISTORY — DX: Occlusion and stenosis of right carotid artery: I65.21

## 2018-02-25 HISTORY — DX: Heart failure, unspecified: I50.9

## 2018-02-25 HISTORY — PX: ENDARTERECTOMY: SHX5162

## 2018-02-25 HISTORY — DX: Cardiac murmur, unspecified: R01.1

## 2018-02-25 HISTORY — PX: CAROTID ENDARTERECTOMY: SUR193

## 2018-02-25 SURGERY — ENDARTERECTOMY, CAROTID
Anesthesia: General | Site: Neck | Laterality: Right

## 2018-02-25 MED ORDER — HEPARIN SODIUM (PORCINE) 1000 UNIT/ML IJ SOLN
INTRAMUSCULAR | Status: DC | PRN
  Administered 2018-02-25: 9000 [IU] via INTRAVENOUS

## 2018-02-25 MED ORDER — ACETAMINOPHEN 325 MG RE SUPP: 325.0000 mg | RECTAL | Status: DC | PRN

## 2018-02-25 MED ORDER — GLYCOPYRROLATE PF 0.2 MG/ML IJ SOSY
PREFILLED_SYRINGE | INTRAMUSCULAR | Status: AC
  Filled 2018-02-25: qty 1

## 2018-02-25 MED ORDER — OXYCODONE-ACETAMINOPHEN 5-325 MG PO TABS: 1.0000 | ORAL_TABLET | ORAL | Status: DC | PRN

## 2018-02-25 MED ORDER — ONDANSETRON HCL 4 MG/2ML IJ SOLN
INTRAMUSCULAR | Status: DC | PRN
  Administered 2018-02-25: 4 mg via INTRAVENOUS

## 2018-02-25 MED ORDER — POTASSIUM CHLORIDE CRYS ER 20 MEQ PO TBCR: 20.0000 meq | EXTENDED_RELEASE_TABLET | Freq: Every day | ORAL | Status: DC | PRN

## 2018-02-25 MED ORDER — FENTANYL CITRATE (PF) 100 MCG/2ML IJ SOLN: 25.0000 ug | INTRAMUSCULAR | Status: DC | PRN

## 2018-02-25 MED ORDER — ROCURONIUM BROMIDE 50 MG/5ML IV SOSY
PREFILLED_SYRINGE | INTRAVENOUS | Status: AC
  Filled 2018-02-25: qty 5

## 2018-02-25 MED ORDER — EPHEDRINE 5 MG/ML INJ
INTRAVENOUS | Status: AC
  Filled 2018-02-25: qty 10

## 2018-02-25 MED ORDER — DEXAMETHASONE SODIUM PHOSPHATE 10 MG/ML IJ SOLN
INTRAMUSCULAR | Status: AC
  Filled 2018-02-25: qty 1

## 2018-02-25 MED ORDER — GLYCOPYRROLATE 0.2 MG/ML IJ SOLN
INTRAMUSCULAR | Status: DC | PRN
  Administered 2018-02-25: 0.1 mg via INTRAVENOUS

## 2018-02-25 MED ORDER — SODIUM CHLORIDE 0.9 % IV SOLN
INTRAVENOUS | Status: DC | PRN
  Administered 2018-02-25: 25 ug/min via INTRAVENOUS

## 2018-02-25 MED ORDER — ONDANSETRON HCL 4 MG/2ML IJ SOLN
INTRAMUSCULAR | Status: AC
  Filled 2018-02-25: qty 2

## 2018-02-25 MED ORDER — ALBUMIN HUMAN 5 % IV SOLN
INTRAVENOUS | Status: DC | PRN
  Administered 2018-02-25: 11:00:00 via INTRAVENOUS

## 2018-02-25 MED ORDER — SODIUM CHLORIDE 0.9 % IV SOLN
500.0000 mL | Freq: Once | INTRAVENOUS | Status: AC | PRN
  Administered 2018-02-25: 500 mL via INTRAVENOUS

## 2018-02-25 MED ORDER — BISACODYL 10 MG RE SUPP: 10.0000 mg | Freq: Every day | RECTAL | Status: DC | PRN

## 2018-02-25 MED ORDER — OXYCODONE-ACETAMINOPHEN 5-325 MG PO TABS: 1.0000 | ORAL_TABLET | Freq: Four times a day (QID) | ORAL | 0 refills | Status: DC | PRN

## 2018-02-25 MED ORDER — ATORVASTATIN CALCIUM 40 MG PO TABS
40.0000 mg | ORAL_TABLET | Freq: Every day | ORAL | Status: DC
  Administered 2018-02-25: 40 mg via ORAL
  Filled 2018-02-25: qty 1

## 2018-02-25 MED ORDER — PROTAMINE SULFATE 10 MG/ML IV SOLN
INTRAVENOUS | Status: AC
  Filled 2018-02-25: qty 5

## 2018-02-25 MED ORDER — ALBUMIN HUMAN 5 % IV SOLN
INTRAVENOUS | Status: AC
  Filled 2018-02-25: qty 250

## 2018-02-25 MED ORDER — FENTANYL CITRATE (PF) 250 MCG/5ML IJ SOLN
INTRAMUSCULAR | Status: AC
  Filled 2018-02-25: qty 5

## 2018-02-25 MED ORDER — SODIUM CHLORIDE 0.9 % IV SOLN
INTRAVENOUS | Status: DC | PRN
  Administered 2018-02-25: 500 mL

## 2018-02-25 MED ORDER — HYDRALAZINE HCL 20 MG/ML IJ SOLN
5.0000 mg | INTRAMUSCULAR | Status: DC | PRN
  Filled 2018-02-25: qty 0.25

## 2018-02-25 MED ORDER — SODIUM CHLORIDE 0.9 % IV SOLN
INTRAVENOUS | Status: AC
  Filled 2018-02-25: qty 1.2

## 2018-02-25 MED ORDER — CEFAZOLIN SODIUM-DEXTROSE 2-4 GM/100ML-% IV SOLN
INTRAVENOUS | Status: AC
  Filled 2018-02-25: qty 100

## 2018-02-25 MED ORDER — EPHEDRINE SULFATE 50 MG/ML IJ SOLN
INTRAMUSCULAR | Status: DC | PRN
  Administered 2018-02-25 (×2): 5 mg via INTRAVENOUS

## 2018-02-25 MED ORDER — SODIUM CHLORIDE 0.9 % IV SOLN
INTRAVENOUS | Status: DC
  Administered 2018-02-25: 500 mL via INTRAVENOUS
  Administered 2018-02-25: 16:00:00 via INTRAVENOUS

## 2018-02-25 MED ORDER — ACETAMINOPHEN 325 MG PO TABS: 325.0000 mg | ORAL_TABLET | ORAL | Status: DC | PRN

## 2018-02-25 MED ORDER — LIDOCAINE HCL (PF) 1 % IJ SOLN
INTRAMUSCULAR | Status: AC
  Filled 2018-02-25: qty 30

## 2018-02-25 MED ORDER — MORPHINE SULFATE (PF) 2 MG/ML IV SOLN: 2.0000 mg | INTRAVENOUS | Status: DC | PRN

## 2018-02-25 MED ORDER — 0.9 % SODIUM CHLORIDE (POUR BTL) OPTIME
TOPICAL | Status: DC | PRN
  Administered 2018-02-25: 2000 mL

## 2018-02-25 MED ORDER — SUGAMMADEX SODIUM 200 MG/2ML IV SOLN
INTRAVENOUS | Status: DC | PRN
  Administered 2018-02-25: 178 mg via INTRAVENOUS
  Administered 2018-02-25: 50 mg via INTRAVENOUS

## 2018-02-25 MED ORDER — DEXAMETHASONE SODIUM PHOSPHATE 10 MG/ML IJ SOLN
INTRAMUSCULAR | Status: DC | PRN
  Administered 2018-02-25: 10 mg via INTRAVENOUS

## 2018-02-25 MED ORDER — LACTATED RINGERS IV SOLN
INTRAVENOUS | Status: DC | PRN
  Administered 2018-02-25 (×2): via INTRAVENOUS

## 2018-02-25 MED ORDER — ALBUMIN HUMAN 5 % IV SOLN
12.5000 g | Freq: Once | INTRAVENOUS | Status: AC
  Administered 2018-02-25: 12.5 g via INTRAVENOUS

## 2018-02-25 MED ORDER — POLYETHYLENE GLYCOL 3350 17 G PO PACK: 17.0000 g | PACK | Freq: Every day | ORAL | Status: DC | PRN

## 2018-02-25 MED ORDER — GUAIFENESIN-DM 100-10 MG/5ML PO SYRP: 15.0000 mL | ORAL_SOLUTION | ORAL | Status: DC | PRN

## 2018-02-25 MED ORDER — PROPOFOL 10 MG/ML IV BOLUS
INTRAVENOUS | Status: DC | PRN
  Administered 2018-02-25: 150 mg via INTRAVENOUS

## 2018-02-25 MED ORDER — CEFAZOLIN SODIUM-DEXTROSE 2-4 GM/100ML-% IV SOLN
2.0000 g | Freq: Three times a day (TID) | INTRAVENOUS | Status: AC
  Administered 2018-02-25 – 2018-02-26 (×2): 2 g via INTRAVENOUS
  Filled 2018-02-25 (×2): qty 100

## 2018-02-25 MED ORDER — CHLORHEXIDINE GLUCONATE 4 % EX LIQD: 60.0000 mL | Freq: Once | CUTANEOUS | Status: DC

## 2018-02-25 MED ORDER — ONDANSETRON HCL 4 MG/2ML IJ SOLN: 4.0000 mg | Freq: Four times a day (QID) | INTRAMUSCULAR | Status: DC | PRN

## 2018-02-25 MED ORDER — LIDOCAINE 2% (20 MG/ML) 5 ML SYRINGE
INTRAMUSCULAR | Status: AC
  Filled 2018-02-25: qty 5

## 2018-02-25 MED ORDER — ALUM & MAG HYDROXIDE-SIMETH 200-200-20 MG/5ML PO SUSP: 15.0000 mL | ORAL | Status: DC | PRN

## 2018-02-25 MED ORDER — DOCUSATE SODIUM 100 MG PO CAPS
100.0000 mg | ORAL_CAPSULE | Freq: Every day | ORAL | Status: DC
  Administered 2018-02-26: 100 mg via ORAL
  Filled 2018-02-25: qty 1

## 2018-02-25 MED ORDER — METOPROLOL SUCCINATE ER 100 MG PO TB24
100.0000 mg | ORAL_TABLET | Freq: Two times a day (BID) | ORAL | Status: DC
  Administered 2018-02-25 – 2018-02-26 (×2): 100 mg via ORAL
  Filled 2018-02-25 (×2): qty 1

## 2018-02-25 MED ORDER — FENTANYL CITRATE (PF) 250 MCG/5ML IJ SOLN
INTRAMUSCULAR | Status: DC | PRN
  Administered 2018-02-25 (×6): 50 ug via INTRAVENOUS
  Administered 2018-02-25: 100 ug via INTRAVENOUS

## 2018-02-25 MED ORDER — PHENOL 1.4 % MT LIQD: 1.0000 | OROMUCOSAL | Status: DC | PRN

## 2018-02-25 MED ORDER — ROCURONIUM BROMIDE 50 MG/5ML IV SOSY
PREFILLED_SYRINGE | INTRAVENOUS | Status: DC | PRN
  Administered 2018-02-25: 30 mg via INTRAVENOUS
  Administered 2018-02-25 (×2): 20 mg via INTRAVENOUS
  Administered 2018-02-25: 30 mg via INTRAVENOUS

## 2018-02-25 MED ORDER — PANTOPRAZOLE SODIUM 40 MG PO TBEC
40.0000 mg | DELAYED_RELEASE_TABLET | Freq: Every day | ORAL | Status: DC
  Administered 2018-02-26: 40 mg via ORAL
  Filled 2018-02-25: qty 1

## 2018-02-25 MED ORDER — HYDROCHLOROTHIAZIDE 25 MG PO TABS
25.0000 mg | ORAL_TABLET | Freq: Every day | ORAL | Status: DC
  Administered 2018-02-26: 25 mg via ORAL
  Filled 2018-02-25: qty 1

## 2018-02-25 MED ORDER — LIDOCAINE 2% (20 MG/ML) 5 ML SYRINGE
INTRAMUSCULAR | Status: DC | PRN
  Administered 2018-02-25: 100 mg via INTRAVENOUS

## 2018-02-25 MED ORDER — MAGNESIUM SULFATE 2 GM/50ML IV SOLN: 2.0000 g | Freq: Every day | INTRAVENOUS | Status: DC | PRN

## 2018-02-25 MED ORDER — SODIUM CHLORIDE 0.9 % IV SOLN: INTRAVENOUS | Status: DC

## 2018-02-25 MED ORDER — CEFAZOLIN SODIUM-DEXTROSE 2-4 GM/100ML-% IV SOLN
2.0000 g | INTRAVENOUS | Status: AC
  Administered 2018-02-25: 2 g via INTRAVENOUS

## 2018-02-25 MED ORDER — ASPIRIN EC 325 MG PO TBEC
325.0000 mg | DELAYED_RELEASE_TABLET | Freq: Every day | ORAL | Status: DC
  Administered 2018-02-26: 325 mg via ORAL
  Filled 2018-02-25: qty 1

## 2018-02-25 MED ORDER — PROPOFOL 10 MG/ML IV BOLUS
INTRAVENOUS | Status: AC
  Filled 2018-02-25: qty 40

## 2018-02-25 MED ORDER — LOSARTAN POTASSIUM 50 MG PO TABS
100.0000 mg | ORAL_TABLET | Freq: Every day | ORAL | Status: DC
  Administered 2018-02-26: 100 mg via ORAL
  Filled 2018-02-25: qty 2

## 2018-02-25 MED ORDER — LABETALOL HCL 5 MG/ML IV SOLN
INTRAVENOUS | Status: DC | PRN
  Administered 2018-02-25: 5 mg via INTRAVENOUS

## 2018-02-25 MED ORDER — LABETALOL HCL 5 MG/ML IV SOLN
10.0000 mg | INTRAVENOUS | Status: DC | PRN
  Filled 2018-02-25: qty 4

## 2018-02-25 MED ORDER — PROTAMINE SULFATE 10 MG/ML IV SOLN
INTRAVENOUS | Status: DC | PRN
  Administered 2018-02-25: 20 mg via INTRAVENOUS
  Administered 2018-02-25 (×3): 10 mg via INTRAVENOUS

## 2018-02-25 MED ORDER — METOPROLOL TARTRATE 5 MG/5ML IV SOLN: 2.0000 mg | INTRAVENOUS | Status: DC | PRN

## 2018-02-25 SURGICAL SUPPLY — 45 items
ADH SKN CLS APL DERMABOND .7 (GAUZE/BANDAGES/DRESSINGS) ×1
CANISTER SUCT 3000ML PPV (MISCELLANEOUS) ×3 IMPLANT
CANNULA VESSEL 3MM 2 BLNT TIP (CANNULA) ×6 IMPLANT
CATH ROBINSON RED A/P 18FR (CATHETERS) ×3 IMPLANT
CLIP LIGATING EXTRA MED SLVR (CLIP) ×3 IMPLANT
CLIP LIGATING EXTRA SM BLUE (MISCELLANEOUS) ×3 IMPLANT
COVER WAND RF STERILE (DRAPES) ×3 IMPLANT
CRADLE DONUT ADULT HEAD (MISCELLANEOUS) ×3 IMPLANT
DECANTER SPIKE VIAL GLASS SM (MISCELLANEOUS) IMPLANT
DERMABOND ADVANCED (GAUZE/BANDAGES/DRESSINGS) ×2
DERMABOND ADVANCED .7 DNX12 (GAUZE/BANDAGES/DRESSINGS) ×1 IMPLANT
DRAIN HEMOVAC 1/8 X 5 (WOUND CARE) IMPLANT
ELECT REM PT RETURN 9FT ADLT (ELECTROSURGICAL) ×3
ELECTRODE REM PT RTRN 9FT ADLT (ELECTROSURGICAL) ×1 IMPLANT
EVACUATOR SILICONE 100CC (DRAIN) IMPLANT
GLOVE BIO SURGEON STRL SZ 6.5 (GLOVE) ×2 IMPLANT
GLOVE BIO SURGEON STRL SZ7 (GLOVE) ×2 IMPLANT
GLOVE BIO SURGEON STRL SZ7.5 (GLOVE) ×2 IMPLANT
GLOVE BIO SURGEONS STRL SZ 6.5 (GLOVE) ×2
GLOVE BIOGEL PI IND STRL 7.0 (GLOVE) IMPLANT
GLOVE BIOGEL PI INDICATOR 7.0 (GLOVE) ×2
GLOVE SS BIOGEL STRL SZ 7.5 (GLOVE) ×1 IMPLANT
GLOVE SUPERSENSE BIOGEL SZ 7.5 (GLOVE) ×2
GOWN STRL REUS W/ TWL LRG LVL3 (GOWN DISPOSABLE) ×3 IMPLANT
GOWN STRL REUS W/TWL LRG LVL3 (GOWN DISPOSABLE) ×9
KIT BASIN OR (CUSTOM PROCEDURE TRAY) ×3 IMPLANT
KIT SHUNT ARGYLE CAROTID ART 6 (VASCULAR PRODUCTS) IMPLANT
KIT TURNOVER KIT B (KITS) ×3 IMPLANT
NEEDLE 22X1 1/2 (OR ONLY) (NEEDLE) IMPLANT
NS IRRIG 1000ML POUR BTL (IV SOLUTION) ×6 IMPLANT
PACK CAROTID (CUSTOM PROCEDURE TRAY) ×3 IMPLANT
PAD ARMBOARD 7.5X6 YLW CONV (MISCELLANEOUS) ×6 IMPLANT
PATCH HEMASHIELD 8X75 (Vascular Products) ×2 IMPLANT
SHUNT CAROTID BYPASS 10 (VASCULAR PRODUCTS) ×2 IMPLANT
SHUNT CAROTID BYPASS 12FRX15.5 (VASCULAR PRODUCTS) IMPLANT
SUT ETHILON 3 0 PS 1 (SUTURE) IMPLANT
SUT PROLENE 6 0 CC (SUTURE) ×5 IMPLANT
SUT SILK 3 0 (SUTURE)
SUT SILK 3-0 18XBRD TIE 12 (SUTURE) IMPLANT
SUT VIC AB 3-0 SH 27 (SUTURE) ×6
SUT VIC AB 3-0 SH 27X BRD (SUTURE) ×2 IMPLANT
SUT VICRYL 4-0 PS2 18IN ABS (SUTURE) ×3 IMPLANT
SYR CONTROL 10ML LL (SYRINGE) IMPLANT
TOWEL GREEN STERILE (TOWEL DISPOSABLE) ×3 IMPLANT
WATER STERILE IRR 1000ML POUR (IV SOLUTION) ×3 IMPLANT

## 2018-02-25 NOTE — Op Note (Signed)
    OPERATIVE REPORT  DATE OF SURGERY: 02/25/2018  PATIENT: [name] [name], 56 y.o. male MRN: [id number]  DOB: 15-Mar-1961  PRE-OPERATIVE DIAGNOSIS: Symptomatic right carotid stenosis.  POST-OPERATIVE DIAGNOSIS:  Same  PROCEDURE: Right carotid endarterectomy and Dacron patch angioplasty  SURGEON:  Gretta Began, M.D.  PHYSICIAN ASSISTANT: Darlin Coco, PA-C  ANESTHESIA: General  EBL: per anesthesia record  Total I/O In: 1650 [I.V.:1400; IV Piggyback:250] Out: 225 [Urine:225]  BLOOD ADMINISTERED: none  DRAINS: none  SPECIMEN: none  COUNTS CORRECT:  YES  PATIENT DISPOSITION:  PACU - hemodynamically stable  PROCEDURE DETAILS: The patient was taken to the operating placed supine position with area of the right neck was prepped and draped you sterile fashion.  Incision was made anterior sternocleidomastoid and carried down through the platysma letter cautery.  Surgical muscular posterior and the carotid sheath was opened.  Patient had a high bifurcation of the carotid artery.  The common carotid artery was encircled with umbilical tape and Rummel tourniquet.  The facial vein was ligated and divided.  The vagus nerve was identified and preserved.  Dissection was tented onto the bifurcation.  The superior thyroid artery was encircled with a 2-0 silk Potts tie.  The external carotid was encircled with a blue vessel loop and the internal carotid was enlarged encircled with a umbilical tape and Rummel tourniquet.  The patient was given 9000 units of intravenous heparin and after adequate circulation time the internal/external and common carotid arteries were occluded.  Common carotid artery was opened with 11 blade and sent lost any Potts scissors onto the internal carotid.  Patient had excellent backbleeding and therefore shunt was not used.  The patient had moderate stenosis and had an extremely ulcerative plaque in the internal carotid as was seen by CT scan.  The endarterectomy was  going on the common carotid artery and the plaque was divided proximally with Potts scissors.  The endarterectomy was continued onto the bifurcation.  The external carotid was endarterectomized with an eversion technique and the internal carotid in an open fashion.  Remaining atheromatous debris was removed from the endarterectomy plane.  A Finesse Hemashield Dacron patch was brought to the field and was sewn as a patch angioplasty with a running 6-0 Prolene suture.  Prior to completion of the closure the shunt was removed in the usual flushing maneuvers were undertaken.  The anastomosis completed and flow was restored to the external and the internal carotid artery.  Excellent flow characteristics were noted with hand-held Doppler in the carotid arteries.  Patient was given 50 mg of protamine versus heparin.  Wounds irrigated with saline.  Hemostasis with cautery.  Wounds were closed with 3-0 Vicryl sutures to reapproximate sternocleidomastoid over the carotid sheath.  Next the platysma was closed with running 0 Vicryl suture.  Finally the skin was closed with a 4 oh sub-particular Vicryl suture.  The patient was awakened neurologically intact in the operating room was transferred to the recovery room in stable condition   Larina Earthly, M.D., [facility] 02/25/2018 1:26 PM

## 2018-02-25 NOTE — Anesthesia Procedure Notes (Signed)
Procedure Name: Intubation Date/Time: 02/25/2018 8:44 AM Performed by: Modena Morrow, CRNA Pre-anesthesia Checklist: Patient identified, Emergency Drugs available, Suction available, Patient being monitored and Timeout performed Patient Re-evaluated:Patient Re-evaluated prior to induction Oxygen Delivery Method: Circle system utilized Induction Type: IV induction Ventilation: Mask ventilation without difficulty Laryngoscope Size: Miller and 2 Grade View: Grade II Tube type: Oral Tube size: 7.5 mm Number of attempts: 1 Airway Equipment and Method: Stylet Placement Confirmation: ETT inserted through vocal cords under direct vision Secured at: 23 cm Tube secured with: Tape Dental Injury: Teeth and Oropharynx as per pre-operative assessment

## 2018-02-25 NOTE — Progress Notes (Signed)
  Day of Surgery Note    Subjective:  Awake in pacu; no complaints; no trouble swallowing   Vitals:   02/25/18 1308 02/25/18 1323  BP: 95/61 96/61  Pulse: (!) 57 (!) 56  Resp: 12 12  Temp: (!) 97.5 F (36.4 C)   SpO2: 98% 97%    Incisions:   Clean and dry  Extremities:  Grips equal bilaterally; moving BLE equally Cardiac:  regular Lungs:  Non labored Neuro:  In tact; tongue is midline   Assessment/Plan:  This is a 57 y.o. male who is s/p  Right carotid endarterectomy  -pt doing well in pacu and neuro in tact -has received albumin and 500cc fluid bolus.  Systolic running around 120 right now.  If it drifts down, okay to give another 500cc fluid bolus. -anticipate dc tomorrow if uneventful night   Doreatha Massed, PA-C 02/25/2018 1:44 PM [phone]

## 2018-02-25 NOTE — Anesthesia Procedure Notes (Signed)
Arterial Line Insertion Start/End11/09/2017 8:05 AM, 02/25/2018 8:15 AM Performed by: Modena Morrow, CRNA, CRNA  Preanesthetic checklist: patient identified, IV checked, site marked, risks and benefits discussed, surgical consent, monitors and equipment checked, pre-op evaluation, timeout performed and anesthesia consent Right, radial was placed Catheter size: 20 G Hand hygiene performed  and maximum sterile barriers used  Allen's test indicative of satisfactory collateral circulation Attempts: 1 Procedure performed without using ultrasound guided technique. Following insertion, dressing applied and Biopatch. Post procedure assessment: normal  Patient tolerated the procedure well with no immediate complications.

## 2018-02-25 NOTE — Discharge Summary (Signed)
Discharge Summary     BURRELL HODAPP Aug 27, 1960 57 y.o. male  [id number]  Admission Date: 02/25/2018  Discharge Date: 02/26/18  Physician: Larina Earthly, MD  Admission Diagnosis: RIGHT CAROTID ARTERY STENOSIS   HPI:   This is a 57 y.o. male who isseen today for discussion of recent neurologic deficit. He reported an episode on August 1 of left upper extremity numbness radiating to the left side face lasted less than 1 minute. He had a similar episode later that evening. The following day he had an episode of left lower extremity numbness and tingling lasting for several minutes. He reported to his primary care provider in [place] on 11/24/2017. Carotid ultrasound at that time revealed 50 to 69% stenosis in the right carotid and less than 50% stenosis in the left carotid artery. He also was found to have a slightly elevated troponin and was sent to the [facility] emergency department where further work-up was negative. He does have a history of known cardiomyopathy. He was seen by Dr. Everlena Cooper with neurology and was referred for further evaluation with apparent symptomatic right carotid stenosis. He has not had an MRI or any other more advanced imaging. He did undergo a repeat carotid duplex at [facility] Course:  The patient was admitted to the hospital and taken to the operating room on 02/25/2018 and underwent right carotid endarterectomy.    The pt tolerated the procedure well and was transported to the PACU in good condition.   By POD 1, the pt neuro status is in tact.  He does not have any difficulty swallowing and is ambulating well.  He is also voiding without difficulty.   The remainder of the hospital course consisted of increasing mobilization and increasing intake of solids without difficulty.   Labs: CBC    Component Value Date/Time   WBC 13.3 (H) 02/26/2018 0343   RBC 3.74 (L) 02/26/2018 0343   HGB 11.5 (L) 02/26/2018 0343   HCT 35.2 (L) 02/26/2018 0343   PLT 156 02/26/2018 0343   MCV 94.1 02/26/2018 0343   MCH 30.7 02/26/2018 0343   MCHC 32.7 02/26/2018 0343   RDW 12.3 02/26/2018 0343   LYMPHSABS 2.0 04/04/2017 1532   MONOABS 0.5 04/04/2017 1532   EOSABS 0.1 04/04/2017 1532   BASOSABS 0.0 04/04/2017 1532   BMET    Component Value Date/Time   NA 136 02/26/2018 0343   K 4.3 02/26/2018 0343   CL 104 02/26/2018 0343   CO2 25 02/26/2018 0343   GLUCOSE 149 (H) 02/26/2018 0343   BUN 21 (H) 02/26/2018 0343   CREATININE 1.19 02/26/2018 0343   CALCIUM 8.1 (L) 02/26/2018 0343   GFRNONAA >60 02/26/2018 0343   GFRAA >60 02/26/2018 0343     Discharge Diagnosis:  RIGHT CAROTID ARTERY STENOSIS  Secondary Diagnosis: Patient Active Problem List   Diagnosis Date Noted  . Carotid artery stenosis, symptomatic, right 02/25/2018  . HOCM (hypertrophic obstructive cardiomyopathy) (HCC) 04/13/2013  . Syncope 08/01/2010  . FATIGUE / MALAISE 02/21/2010  . HYPERLIPIDEMIA 06/21/2009  . Benign essential HTN 06/21/2009  . GERD 06/21/2009   Past Medical History:  Diagnosis Date  . Arthritis    "some in my hands" (02/25/2018)  . Carotid artery stenosis, asymptomatic, right   . Chest pain     -Lexiscan Myoview 3/12: ? small apical lateral ischemia, EF 53%; similar to study in 2/11  . CHF (congestive heart failure) (HCC)    hx  . Erectile dysfunction   .  GERD (gastroesophageal reflux disease)   . Heart murmur    hx  . HOCM (hypertrophic obstructive cardiomyopathy) (HCC)     --cardiac cath 2007. nomrmal cors;  s/p myomectomy 2008;  Echo 3/12: mod to severe LVH, HCM, EF 65%, resting LVOT peak 2.21 m/s, mild LAE, mild MR, AV mean 9;  Echo 12/13: severe LVH, EF 80%, Gr 2 diast dsyfn, mild LAE, mild RVE  . HTN (hypertension)   . Hyperlipidemia     Allergies as of 02/26/2018   No Known Allergies     Medication List    TAKE these medications   aspirin EC 81 MG tablet Take 4 tablets (325 mg total) by mouth  daily.   atorvastatin 40 MG tablet Commonly known as:  LIPITOR Take 1 tablet (40 mg total) by mouth daily.   hydrochlorothiazide 25 MG tablet Commonly known as:  HYDRODIURIL TAKE 1 TABLET DAILY (NEEDS TO SCHEDULE AN APPOINTMENT WITH OUR OFFICE AT [phone] FOR FUTURE REFILLS) What changed:  See the new instructions.   losartan 100 MG tablet Commonly known as:  COZAAR TAKE 1 TABLET DAILY   metoprolol succinate 100 MG 24 hr tablet Commonly known as:  TOPROL-XL TAKE 1 TABLET TWICE A DAY WITH OR IMMEDIATELY FOLLOWING A MEAL (NEED TO SCHEDULE APPT WITH OUR OFFICE AT [phone] FOR FUTURE REFILLS) What changed:  See the new instructions.   oxyCODONE-acetaminophen 5-325 MG tablet Commonly known as:  PERCOCET/ROXICET Take 1 tablet by mouth every 6 (six) hours as needed for severe pain.        [facility] Discharge Instructions Carotid Endarterectomy (CEA)  Please refer to the following instructions for your post-procedure care. Your surgeon or physician assistant will discuss any changes with you.  Activity  You are encouraged to walk as much as you can. You can slowly return to normal activities but must avoid strenuous activity and heavy lifting until your doctor tell you it's OK. Avoid activities such as vacuuming or swinging a golf club. You can drive after one week if you are comfortable and you are no longer taking prescription pain medications. It is normal to feel tired for serval weeks after your surgery. It is also normal to have difficulty with sleep habits, eating, and bowel movements after surgery. These will go away with time.  Bathing/Showering  You may shower after you come home. Do not soak in a bathtub, hot tub, or swim until the incision heals completely.  Incision Care  Shower every day. Clean your incision with mild soap and water. Pat the area dry with a clean towel. You do not need a bandage unless otherwise instructed. Do  not apply any ointments or creams to your incision. You may have skin glue on your incision. Do not peel it off. It will come off on its own in about one week. Your incision may feel thickened and raised for several weeks after your surgery. This is normal and the skin will soften over time. For Men Only: It's OK to shave around the incision but do not shave the incision itself for 2 weeks. It is common to have numbness under your chin that could last for several months.  Diet  Resume your normal diet. There are no special food restrictions following this procedure. A low fat/low cholesterol diet is recommended for all patients with vascular disease. In order to heal from your surgery, it is CRITICAL to get adequate nutrition. Your body requires vitamins, minerals, and protein. Vegetables are  the best source of vitamins and minerals. Vegetables also provide the perfect balance of protein. Processed food has little nutritional value, so try to avoid this.  Medications  Resume taking all of your medications unless your doctor or physician assistant tells you not to.  If your incision is causing pain, you may take over-the- counter pain relievers such as acetaminophen (Tylenol). If you were prescribed a stronger pain medication, please be aware these medications can cause nausea and constipation.  Prevent nausea by taking the medication with a snack or meal. Avoid constipation by drinking plenty of fluids and eating foods with a high amount of fiber, such as fruits, vegetables, and grains.  Do not take Tylenol if you are taking prescription pain medications.  Follow Up  Our office will schedule a follow up appointment 2-3 weeks following discharge.  Please call us immediately for any of the following conditions  . Increased pain, redness, drainage (pus) from your incision site. . Fever of 101 degrees or higher. . If you should develop stroke (slurred speech, difficulty swallowing, weakness on one  side of your body, loss of vision) you should call 911 and go to the nearest emergency room. .  Reduce your risk of vascular disease:  . Stop smoking. If you would like help call [facility] at 1-800-QUIT-NOW (406-823-6275) or [facility] at 431-765-4641. . Manage your cholesterol . Maintain a desired weight . Control your diabetes . Keep your blood pressure down .  If you have any questions, please call the office at [phone].  Prescriptions given: 1.   Roxicet #8 No Refill   Disposition: home  Patient's condition: is Good  Follow up: 1. Dr. Arbie Cookey in 2 weeks.   Doreatha Massed, PA-C [facility] [phone]   --- For [facility] use ---   Modified Rankin score at D/C (0-6): 0  IV medication needed for:  1. Hypertension: No 2. Hypotension: No  Post-op Complications: No  1. Post-op CVA or TIA: No  If yes: Event classification (right eye, left eye, right cortical, left cortical, verterobasilar, other): n/a  If yes: Timing of event (intra-op, <6 hrs post-op, >=6 hrs post-op, unknown): n/a  2. CN injury: No  If yes: CN n/a injuried   3. Myocardial infarction: No  If yes: Dx by (EKG or clinical, Troponin): n/a  4.  CHF: No  5.  Dysrhythmia (new): No  6. Wound infection: No  7. Reperfusion symptoms: No  8. Return to OR: No  If yes: return to OR for (bleeding, neurologic, other CEA incision, other): n/a  Discharge medications: Statin use:  Yes ASA use:  Yes   Beta blocker use:  Yes ACE-Inhibitor use:  No  ARB use:  Yes CCB use: No P2Y12 Antagonist use: No, [ ]  Plavix, [ ]  Plasugrel, [ ]  Ticlopinine, [ ]  Ticagrelor, [ ]  Other, [ ]  No for medical reason, [ ]  Non-compliant, [ ]  Not-indicated Anti-coagulant use:  No, [ ]  Warfarin, [ ]  Rivaroxaban, [ ]  Dabigatran,

## 2018-02-25 NOTE — H&P (Signed)
Office Visit   01/19/2018 [facility]    , Arvilla Meres, MD  Vascular Surgery   Bilateral carotid artery stenosis  Dx   Referred by Pieter Partridge, DO  Reason for Visit   Additional Documentation   Vitals:   BP 149/93 (BP Location: Right Arm, Patient Position: Sitting, Cuff Size: Normal)   Pulse 55    Temp 96.9 F (36.1 C) (Temporal)   Resp 20   Ht '5\' 3"'  (1.6 m)   Wt 91.2 kg   BMI 35.61 kg/m   BSA 2.01 m   Pain [place] 0-No pain     More Vitals   Flowsheets:   MEWS Score,   Anthropometrics,   Clinical Intake     Encounter Info:   Billing Info,   History,   Allergies,   Detailed Report     All Notes   Progress Notes by Rosetta Posner, MD at 01/19/2018 1:00 PM  Author: Rosetta Posner, MD Author Type: Physician Filed: 01/19/2018 2:09 PM  Note Status: Signed Cosign: Cosign Not Required Encounter Date: 01/19/2018  Editor: Rosetta Posner, MD (Physician)                                        [facility] of [facility]  Patient name: [name] [name]    MRN: [id number]        DOB: 1961/01/26          Sex: male  REASON FOR CONSULT: Evaluation of symptomatic right carotid stenosis  Seen today in our [place] office  HPI: [name] [name] is a 57 y.o. male, who is seen today for discussion of recent neurologic deficit.  He reported an episode on August 1 of left upper extremity numbness radiating to the left side face lasted less than 1 minute.  He had a similar episode later that evening.  The following day he had an episode of left lower extremity numbness and tingling lasting for several minutes.  He reported to his primary care provider in [place] on 11/24/2017.  Carotid ultrasound at that time revealed 50 to 69% stenosis in the right carotid and less than 50% stenosis in the left carotid artery.  He also was found to have a slightly elevated troponin and was sent to the [facility] emergency department where further  work-up was negative.  He does have a history of known cardiomyopathy.  He was seen by Dr. Tomi Likens with neurology and was referred for further evaluation with apparent symptomatic right carotid stenosis.  He has not had an MRI or any other more advanced imaging.  He did undergo a repeat carotid duplex at [facility].      Past Medical History:  Diagnosis Date  . Chest pain     -Lexiscan Myoview 3/12: ? small apical lateral ischemia, EF 53%; similar to study in 2/11  . Erectile dysfunction   . GERD (gastroesophageal reflux disease)   . HOCM (hypertrophic obstructive cardiomyopathy) ([place])     --cardiac cath 2007. nomrmal cors;  s/p myomectomy 2008;  Echo 3/12: mod to severe LVH, HCM, EF 65%, resting LVOT peak 2.21 m/s, mild LAE, mild MR, AV mean 9;  Echo 12/13: severe LVH, EF 80%, Gr 2 diast dsyfn, mild LAE, mild RVE  . HTN (hypertension)   . Hyperlipidemia          Family History  Problem Relation Age of Onset  . Non-Hodgkin's lymphoma Father   . Bladder Cancer Father   . Multiple myeloma Mother   . Kidney failure Mother   . Autism Son     SOCIAL HISTORY: Social History        Socioeconomic History  . Marital status: Married    Spouse name: Alice  . Number of children: 5  . Years of education: Not on file  . Highest education level: 12th grade  Occupational History  . Occupation: route Diplomatic Services operational officer: [employer]  . Financial resource strain: Not on file  . Food insecurity:    Worry: Not on file    Inability: Not on file  . Transportation needs:    Medical: Not on file    Non-medical: Not on file  Tobacco Use  . Smoking status: Never Smoker  . Smokeless tobacco: Never Used  Substance and Sexual Activity  . Alcohol use: No    Frequency: Never  . Drug use: No  . Sexual activity: Not on file  Lifestyle  . Physical activity:    Days per week: Not on file    Minutes per session: Not on file  . Stress: Not  on file  Relationships  . Social connections:    Talks on phone: Not on file    Gets together: Not on file    Attends religious service: Not on file    Active member of club or organization: Not on file    Attends meetings of clubs or organizations: Not on file    Relationship status: Not on file  . Intimate partner violence:    Fear of current or ex partner: Not on file    Emotionally abused: Not on file    Physically abused: Not on file    Forced sexual activity: Not on file  Other Topics Concern  . Not on file  Social History Narrative   Patient is right-handed. He lives with his wife in a 2 story house. He drinks 2 cups of coffee a day, and until 2 weeks ago, he drank several Mt. Dew's a day. He is very active at work.    No Known Allergies        Current Outpatient Medications  Medication Sig Dispense Refill  . aspirin 81 MG tablet Take 4 tablets (325 mg total) by mouth daily.    Marland Kitchen atorvastatin (LIPITOR) 40 MG tablet Take 1 tablet (40 mg total) by mouth daily. 30 tablet 6  . hydrochlorothiazide (HYDRODIURIL) 25 MG tablet TAKE 1 TABLET DAILY 90 tablet 0  . losartan (COZAAR) 100 MG tablet TAKE 1 TABLET DAILY 90 tablet 3  . metoprolol succinate (TOPROL-XL) 100 MG 24 hr tablet TAKE 1 TABLET TWICE A DAY, TAKE WITH OR IMMEDIATELY FOLLOWING A MEAL 180 tablet 0   No current facility-administered medications for this visit.     REVIEW OF SYSTEMS:  '[X]'  denotes positive finding, '[ ]'  denotes negative finding Cardiac  Comments:  Chest pain or chest pressure:    Shortness of breath upon exertion:    Short of breath when lying flat:    Irregular heart rhythm:        Vascular    Pain in calf, thigh, or hip brought on by ambulation:    Pain in feet at night that wakes you up from your sleep:     Blood clot in your veins:    Leg swelling:  Pulmonary    Oxygen at home:    Productive cough:     Wheezing:          Neurologic    Sudden weakness in arms or legs:     Sudden numbness in arms or legs:  x   Sudden onset of difficulty speaking or slurred speech:    Temporary loss of vision in one eye:     Problems with dizziness:         Gastrointestinal    Blood in stool:     Vomited blood:         Genitourinary    Burning when urinating:     Blood in urine:        Psychiatric    Major depression:         Hematologic    Bleeding problems:    Problems with blood clotting too easily:        Skin    Rashes or ulcers:        Constitutional    Fever or chills:      PHYSICAL EXAM:     Vitals:   01/19/18 1305 01/19/18 1306  BP: (!) 152/90 (!) 149/93  Pulse: 60 (!) 55  Resp: 20   Temp: (!) 96.9 F (36.1 C)   TempSrc: Temporal   Weight: 201 lb (91.2 kg)   Height: '5\' 3"'  (1.6 m)     GENERAL: The patient is a well-nourished male, in no acute distress. The vital signs are documented above. CARDIOVASCULAR: Carotid arteries are without bruits bilaterally.  2+ radial and 2+ dorsalis pedis pulses bilaterally PULMONARY: There is good air exchange  ABDOMEN: Soft and non-tender  MUSCULOSKELETAL: There are no major deformities or cyanosis. NEUROLOGIC: No focal weakness or paresthesias are detected. SKIN: There are no ulcers or rashes noted. PSYCHIATRIC: The patient has a normal affect.  DATA:  He did undergo repeat carotid duplex at [facility] today and I have these results for review.  This showed no evidence of significant stenosis on either right or left carotid artery.  MEDICAL ISSUES: I discussed these findings and the significance of detail with the patient and his wife present.  I did explain that there is a discrepancy between findings at [facility] and our studies today at [facility].  I explained that if he indeed had a 50 to 70% stenosis in his right carotid that we would recommend endarterectomy  for reduction of stroke risk with the presumption being this was the cause of his right brain event.  Reports that he is to have repeat imaging at [facility] radiology on Thursday of this week.  It sounds like this is an MRI and probably a CT angiogram of his neck as well.  We will confirm that he is obtaining a CT angiogram later this week and will discuss this further with him following.  I did explain that it is potential that the initial ultrasound was an over read or that today's was an under read of his level of stenosis.  Explained that we would have more information from the CT imaging and would make final recommendation based on this.  I did explain in detail what is involved with a carotid endarterectomy including potential risk.  We will discuss this with him further after his CT angiogram   Rosetta Posner, MD [facility] [phone] Pager [phone]       Addendum:  The patient  has been re-examined and re-evaluated.  The patient's history and physical has been reviewed and is unchanged.    [name] [name] is a 57 y.o. male is being admitted with RIGHT CAROTID ARTERY STENOSIS. All the risks, benefits and other treatment options have been discussed with the patient. The patient has consented to proceed with Procedure(s): ENDARTERECTOMY CAROTID RIGHT as a surgical intervention.    02/25/2018 8:03 AM Vascular and Vein Surgery

## 2018-02-25 NOTE — Transfer of Care (Signed)
Immediate Anesthesia Transfer of Care Note  Patient: [name] [name]  Procedure(s) Performed: ENDARTERECTOMY CAROTID RIGHT (Right Neck)  Patient Location: PACU  Anesthesia Type:General  Level of Consciousness: awake, drowsy and patient cooperative  Airway & Oxygen Therapy: Patient Spontanous Breathing and Patient connected to face mask oxygen  Post-op Assessment: Report given to RN, Post -op Vital signs reviewed and stable and Patient moving all extremities  Post vital signs: Reviewed and stable  Last Vitals:  Vitals Value Taken Time  BP 100/59 02/25/2018 11:38 AM  Temp    Pulse 74 02/25/2018 11:42 AM  Resp 13 02/25/2018 11:42 AM  SpO2 95 % 02/25/2018 11:42 AM  Vitals shown include unvalidated device data.  Last Pain:  Vitals:   02/25/18 0707  PainSc: 0-No pain         Complications: No apparent anesthesia complications

## 2018-02-25 NOTE — Anesthesia Postprocedure Evaluation (Signed)
Anesthesia Post Note  Patient: [name] [name]  Procedure(s) Performed: ENDARTERECTOMY CAROTID RIGHT (Right Neck)     Patient location during evaluation: PACU Anesthesia Type: General Level of consciousness: awake Pain management: pain level controlled Vital Signs Assessment: post-procedure vital signs reviewed and stable Respiratory status: spontaneous breathing Cardiovascular status: stable Postop Assessment: no apparent nausea or vomiting Anesthetic complications: no    Last Vitals:  Vitals:   02/25/18 1453 02/25/18 1527  BP: 99/66   Pulse: 63   Resp: 14   Temp: 36.5 C   SpO2: 92% 92%    Last Pain:  Vitals:   02/25/18 1600  PainSc: 0-No pain                  

## 2018-02-25 NOTE — Progress Notes (Signed)
Pt received from PACU. R endarterectomy. Pt denies any complaints. CCMD notified CHG given. Telebox 10 applied. A line zeroed with appropriate wave form. Vitals stable. Pt family at bedside. Lacy Duverney, RN

## 2018-02-26 ENCOUNTER — Encounter (HOSPITAL_COMMUNITY): Payer: Self-pay | Admitting: Vascular Surgery

## 2018-02-26 LAB — CBC
HEMATOCRIT: 35.2 % — AB (ref 39.0–52.0)
HEMOGLOBIN: 11.5 g/dL — AB (ref 13.0–17.0)
MCH: 30.7 pg (ref 26.0–34.0)
MCHC: 32.7 g/dL (ref 30.0–36.0)
MCV: 94.1 fL (ref 80.0–100.0)
Platelets: 156 10*3/uL (ref 150–400)
RBC: 3.74 MIL/uL — AB (ref 4.22–5.81)
RDW: 12.3 % (ref 11.5–15.5)
WBC: 13.3 10*3/uL — ABNORMAL HIGH (ref 4.0–10.5)
nRBC: 0 % (ref 0.0–0.2)

## 2018-02-26 LAB — BASIC METABOLIC PANEL
Anion gap: 7 (ref 5–15)
BUN: 21 mg/dL — ABNORMAL HIGH (ref 6–20)
CHLORIDE: 104 mmol/L (ref 98–111)
CO2: 25 mmol/L (ref 22–32)
Calcium: 8.1 mg/dL — ABNORMAL LOW (ref 8.9–10.3)
Creatinine, Ser: 1.19 mg/dL (ref 0.61–1.24)
GFR calc non Af Amer: >60 mL/min (ref >60)
Glucose, Bld: 149 mg/dL — ABNORMAL HIGH (ref 70–99)
POTASSIUM: 4.3 mmol/L (ref 3.5–5.1)
SODIUM: 136 mmol/L (ref 135–145)

## 2018-02-26 NOTE — Discharge Instructions (Signed)
[facility]ists of [facility]  Discharge Instructions   Carotid Endarterectomy (CEA)  Please refer to the following instructions for your post-procedure care. Your surgeon or physician assistant will discuss any changes with you.  Activity  You are encouraged to walk as much as you can. You can slowly return to normal activities but must avoid strenuous activity and heavy lifting until your doctor tell you it's okay. Avoid activities such as vacuuming or swinging a golf club. You can drive after one week if you are comfortable and you are no longer taking prescription pain medications. It is normal to feel tired for serval weeks after your surgery. It is also normal to have difficulty with sleep habits, eating, and bowel movements after surgery. These will go away with time.  Bathing/Showering  Shower daily after you go home. Do not soak in a bathtub, hot tub, or swim until the incision heals completely.  Incision Care  Shower every day. Clean your incision with mild soap and water. Pat the area dry with a clean towel. You do not need a bandage unless otherwise instructed. Do not apply any ointments or creams to your incision. You may have skin glue on your incision. Do not peel it off. It will come off on its own in about one week. Your incision may feel thickened and raised for several weeks after your surgery. This is normal and the skin will soften over time.   For Men Only: It's okay to shave around the incision but do not shave the incision itself for 2 weeks. It is common to have numbness under your chin that could last for several months.  Diet  Resume your normal diet. There are no special food restrictions following this procedure. A low fat/low cholesterol diet is recommended for all patients with vascular disease. In order to heal from your surgery, it is CRITICAL to get adequate nutrition. Your body requires vitamins, minerals, and protein. Vegetables are the  best source of vitamins and minerals. Vegetables also provide the perfect balance of protein. Processed food has little nutritional value, so try to avoid this.  Medications  Resume taking all of your medications unless your doctor or physician assistant tells you not to. If your incision is causing pain, you may take over-the- counter pain relievers such as acetaminophen (Tylenol). If you were prescribed a stronger pain medication, please be aware these medications can cause nausea and constipation. Prevent nausea by taking the medication with a snack or meal. Avoid constipation by drinking plenty of fluids and eating foods with a high amount of fiber, such as fruits, vegetables, and grains.  Do not take Tylenol if you are taking prescription pain medications.  Take your blood pressure in the morning and night and record it on paper.  When your systolic blood pressure (top number) is at 120, restart your cozaar (losartan).  When your blood pressure is maintaining around 120 and your heart rate is above 60, then restart your metoprolol.  Follow Up  Our office will schedule a follow up appointment 2-3 weeks following discharge.  Please call us immediately for any of the following conditions   Increased pain, redness, drainage (pus) from your incision site.  Fever of 101 degrees or higher.  If you should develop stroke (slurred speech, difficulty swallowing, weakness on one side of your body, loss of vision) you should call 911 and go to the nearest emergency room.   Reduce your risk of vascular disease:   Stop  smoking. If you would like help call [facility] at 1-800-QUIT-NOW [phone]) or [place] at 5198862422.  Manage your cholesterol  Maintain a desired weight  Control your diabetes  Keep your blood pressure down   If you have any questions, please call the office at [phone].

## 2018-02-26 NOTE — Progress Notes (Addendum)
  Progress Note    02/26/2018 7:17 AM 1 Day Post-Op  Subjective:  No complaints; denies difficulty swallowing.  Has walked and voided. Pain well controlled.  Wants to know if he can go on a cross country trip driving maybe next week.    Afebrile HR 50's-80's NSR 100's-110's systolic 96% RA  Vitals:   02/26/18 0005 02/26/18 0344  BP: 105/66 108/60  Pulse: 65 67  Resp: 13 17  Temp: 98.4 F (36.9 C) 98.2 F (36.8 C)  SpO2: 97% 96%     Physical Exam: Neuro:  In tact; tongue is midline; moving extremities equally Lungs:  Non labored Incision:  Clean and dry with mild swelling  CBC    Component Value Date/Time   WBC 13.3 (H) 02/26/2018 0343   RBC 3.74 (L) 02/26/2018 0343   HGB 11.5 (L) 02/26/2018 0343   HCT 35.2 (L) 02/26/2018 0343   PLT 156 02/26/2018 0343   MCV 94.1 02/26/2018 0343   MCH 30.7 02/26/2018 0343   MCHC 32.7 02/26/2018 0343   RDW 12.3 02/26/2018 0343   LYMPHSABS 2.0 04/04/2017 1532   MONOABS 0.5 04/04/2017 1532   EOSABS 0.1 04/04/2017 1532   BASOSABS 0.0 04/04/2017 1532    BMET    Component Value Date/Time   NA 136 02/26/2018 0343   K 4.3 02/26/2018 0343   CL 104 02/26/2018 0343   CO2 25 02/26/2018 0343   GLUCOSE 149 (H) 02/26/2018 0343   BUN 21 (H) 02/26/2018 0343   CREATININE 1.19 02/26/2018 0343   CALCIUM 8.1 (L) 02/26/2018 0343   GFRNONAA >60 02/26/2018 0343   GFRAA >60 02/26/2018 0343     Intake/Output Summary (Last 24 hours) at 02/26/2018 0717 Last data filed at 02/26/2018 0344 Gross per 24 hour  Intake 2906.27 ml  Output 1625 ml  Net 1281.27 ml     Assessment/Plan:  This is a 57 y.o. male who is s/p right CEA 1 Day Post-Op  -pt is doing well this am.  He is on cozaar and metoprolol.  I discussed with he and his wife to check his blood pressure at home and when his systolic is at 120, he can start the cozaar.  After that, when his systolic is again around 120 and his heart rate above 60, he can restart his metoprolol.   -pt neuro  exam is in tact -pt has ambulated -pt has voided -f/u with Dr. Jearl Klinefelter in 2-3 weeks.   Doreatha Massed, PA-C [facility] [phone]  I have examined the patient, reviewed and agree with above.  Gretta Began, MD 02/26/2018 10:53 AM

## 2018-02-26 NOTE — Progress Notes (Signed)
PT and wife provided education and discharge instructions. Paper prescription provided. Pt IV removed and intact. Vitals stable. Pt denies any complaints. CCMD notified of discharge. Pt has all belongings. Volunteer tx pt via wheelchair to [employer]. Lacy Duverney

## 2018-02-27 ENCOUNTER — Telehealth: Payer: Self-pay | Admitting: Vascular Surgery

## 2018-02-27 NOTE — Telephone Encounter (Signed)
-----   Message from [name], RN sent at 02/25/2018  4:15 PM EST ----- Regarding: 2-3 weeks postop CEA    ----- Message ----- From: Dara Lords, PA-C Sent: 02/25/2018   4:01 PM EST To: [facility] Admin Pool, [facility] Charge Pool  S/p right CEA 02/25/18.  F/u with Dr. Arbie Cookey in 2-3 weeks.  Thanks

## 2018-02-27 NOTE — Telephone Encounter (Signed)
sch appt spk to pt 03/10/18 4pm p/o MD

## 2018-03-10 ENCOUNTER — Encounter: Payer: BLUE CROSS/BLUE SHIELD | Admitting: Vascular Surgery

## 2018-03-10 ENCOUNTER — Encounter: Payer: Self-pay | Admitting: Vascular Surgery

## 2018-03-10 ENCOUNTER — Other Ambulatory Visit: Payer: Self-pay

## 2018-03-10 ENCOUNTER — Ambulatory Visit (INDEPENDENT_AMBULATORY_CARE_PROVIDER_SITE_OTHER): Payer: BLUE CROSS/BLUE SHIELD | Admitting: Vascular Surgery

## 2018-03-10 VITALS — BP 149/99 | HR 68 | Temp 97.3°F | Resp 18 | Ht 63.0 in | Wt 199.0 lb

## 2018-03-10 DIAGNOSIS — I6523 Occlusion and stenosis of bilateral carotid arteries: Secondary | ICD-10-CM

## 2018-03-10 NOTE — Progress Notes (Signed)
POST OPERATIVE OFFICE NOTE    CC:  F/u for surgery right CEA  HPI:  This is a 57 y.o. male who is s/p right CEA by Dr. Arbie Cookey on 02/25/2018.  He had symptomatic right carotid stenosis.  He reported an episode on August 1 of left upper extremity numbness radiating to the left side face lasted less than 1 minute. He had a similar episode later that evening. The following day he had an episode of left lower extremity numbness and tingling lasting for several minutes. He reported to his primary care provider in [place] on 11/24/2017. Carotid ultrasound at that time revealed 50 to 69% stenosis in the right carotid and less than 50% stenosis in the left carotid artery. He also was found to have a slightly elevated troponin and was sent to [facility] department where further work-up was negative. He does have a history of known cardiomyopathy.   Post op he developed B LE edema and increased urination for 10 days.  He has mild pain at the right neck incision with numbness over the right jaw line.  He denise symptoms of stroke or TIA since his surgery.    No Known Allergies  Current Outpatient Medications  Medication Sig Dispense Refill  . aspirin 81 MG tablet Take 4 tablets (325 mg total) by mouth daily.    Marland Kitchen. atorvastatin (LIPITOR) 40 MG tablet Take 1 tablet (40 mg total) by mouth daily. 30 tablet 6  . hydrochlorothiazide (HYDRODIURIL) 25 MG tablet TAKE 1 TABLET DAILY (NEEDS TO SCHEDULE AN APPOINTMENT WITH OUR OFFICE AT [phone] FOR FUTURE REFILLS) (Patient taking differently: Take 25 mg by mouth daily. ) 90 tablet 3  . losartan (COZAAR) 100 MG tablet TAKE 1 TABLET DAILY (Patient taking differently: Take 100 mg by mouth daily. ) 90 tablet 3  . metoprolol succinate (TOPROL-XL) 100 MG 24 hr tablet TAKE 1 TABLET TWICE A DAY WITH OR IMMEDIATELY FOLLOWING A MEAL (NEED TO SCHEDULE APPT WITH OUR OFFICE AT [phone] FOR FUTURE REFILLS) (Patient taking differently: Take 100 mg by  mouth 2 (two) times daily. ) 180 tablet 3  . oxyCODONE-acetaminophen (PERCOCET) 5-325 MG tablet Take 1 tablet by mouth every 6 (six) hours as needed for severe pain. (Patient not taking: Reported on 03/10/2018) 8 tablet 0   No current facility-administered medications for this visit.      ROS:  See HPI  Physical Exam:  Vitals:   03/10/18 1518 03/10/18 1519  BP: (!) 154/102 (!) 149/99  Pulse: 68   Resp: 18   Temp: (!) 97.3 F (36.3 C)   SpO2: 96%     Incision:  Well healed right neck incision, jaw line numbness to touch. Extremities:  Full range of motion all 4 ext without numbness or weakness on exam in the left UE.  B LE without edema Heart RRR Abdomen:  + BS  Assessment/Plan:  This is a 57 y.o. male who is s/p: Right CEA s/p TIA symptomatic right carotid stenosis  He will be out of work until Jan 5 th 2020.  He will return to our office for f/u visit and repeat carotid duplex in 9 months.  If he has symptoms of stroke such as weakness, loss of vision, slurred speech or TIA he should call 911 and [facility] to to [facility].   Mosetta PigeonEmma Maureen Collins PA-C [facility] [phone]  I have examined the patient, reviewed and agree with above.  Gretta Beganodd , MD 03/10/2018 4:44 PM

## 2018-04-10 ENCOUNTER — Ambulatory Visit: Payer: BLUE CROSS/BLUE SHIELD | Admitting: Neurology

## 2018-12-01 ENCOUNTER — Other Ambulatory Visit: Payer: Self-pay

## 2018-12-01 DIAGNOSIS — I6523 Occlusion and stenosis of bilateral carotid arteries: Secondary | ICD-10-CM

## 2018-12-07 ENCOUNTER — Telehealth (HOSPITAL_COMMUNITY): Payer: Self-pay | Admitting: Rehabilitation

## 2018-12-07 NOTE — Telephone Encounter (Signed)

## 2018-12-08 ENCOUNTER — Encounter (HOSPITAL_COMMUNITY): Payer: BLUE CROSS/BLUE SHIELD

## 2018-12-08 ENCOUNTER — Ambulatory Visit (HOSPITAL_COMMUNITY)
Admission: RE | Admit: 2018-12-08 | Discharge: 2018-12-08 | Disposition: A | Discharge status: Home / Self Care | Payer: BC Managed Care – PPO | Source: Ambulatory Visit | Attending: Family | Admitting: Family

## 2018-12-08 ENCOUNTER — Ambulatory Visit: Payer: BLUE CROSS/BLUE SHIELD | Admitting: Family

## 2018-12-08 ENCOUNTER — Encounter: Payer: Self-pay | Admitting: Vascular Surgery

## 2018-12-08 ENCOUNTER — Other Ambulatory Visit: Payer: Self-pay

## 2018-12-08 ENCOUNTER — Telehealth: Payer: Self-pay | Admitting: *Deleted

## 2018-12-08 ENCOUNTER — Ambulatory Visit (INDEPENDENT_AMBULATORY_CARE_PROVIDER_SITE_OTHER): Payer: BC Managed Care – PPO | Admitting: Vascular Surgery

## 2018-12-08 VITALS — Resp 16 | Ht 63.0 in | Wt 195.0 lb

## 2018-12-08 DIAGNOSIS — I6523 Occlusion and stenosis of bilateral carotid arteries: Secondary | ICD-10-CM | POA: Diagnosis not present

## 2018-12-08 NOTE — Progress Notes (Signed)
Virtual Visit via Telephone Note    I connected with [name] [name] on 12/08/2018 using the [employer] by telephone and verified that I was speaking with the correct person using two identifiers. Patient was located at home and accompanied by no one. I am located at [facility].   The limitations of evaluation and management by telemedicine and the availability of in person appointments have been previously discussed with the patient and are documented in the patients chart. The patient expressed understanding and consented to proceed.  PCP: Arsenio Loader, FNP   Chief Complaint: Follow-up carotid disease  History of Present Illness: [name] [name] is a 58 y.o. male with here today for follow-up.  He had a symptomatic right carotid stenosis and underwent uneventful endarterectomy on 02/25/2018.  He reports that he has had no new neurologic deficits.  Has returned to his usual baseline function.  No incisional difficulties.  Past Medical History:  Diagnosis Date  . Arthritis    "some in my hands" (02/25/2018)  . Carotid artery stenosis, asymptomatic, right   . Chest pain     -Lexiscan Myoview 3/12: ? small apical lateral ischemia, EF 53%; similar to study in 2/11  . CHF (congestive heart failure) (HCC)    hx  . Erectile dysfunction   . GERD (gastroesophageal reflux disease)   . Heart murmur    hx  . HOCM (hypertrophic obstructive cardiomyopathy) ([place])     --cardiac cath 2007. nomrmal cors;  s/p myomectomy 2008;  Echo 3/12: mod to severe LVH, HCM, EF 65%, resting LVOT peak 2.21 m/s, mild LAE, mild MR, AV mean 9;  Echo 12/13: severe LVH, EF 80%, Gr 2 diast dsyfn, mild LAE, mild RVE  . HTN (hypertension)   . Hyperlipidemia     Past Surgical History:  Procedure Laterality Date  . CARDIAC CATHETERIZATION  04/14/2006  . CAROTID ENDARTERECTOMY Right 02/25/2018  . ENDARTERECTOMY Right 02/25/2018   Procedure: ENDARTERECTOMY CAROTID RIGHT;  Surgeon: Rosetta Posner, MD;   Location: [facility];  Service: Vascular;  Laterality: Right;  . MYOMECTOMY  04/2006   at [facility]     Current Meds  Medication Sig  . aspirin 81 MG tablet Take 4 tablets (325 mg total) by mouth daily.  . hydrochlorothiazide (HYDRODIURIL) 25 MG tablet TAKE 1 TABLET DAILY (NEEDS TO SCHEDULE AN APPOINTMENT WITH OUR OFFICE AT [phone] FOR FUTURE REFILLS) (Patient taking differently: Take 25 mg by mouth daily. )  . losartan (COZAAR) 100 MG tablet TAKE 1 TABLET DAILY (Patient taking differently: Take 100 mg by mouth daily. )  . metoprolol succinate (TOPROL-XL) 100 MG 24 hr tablet TAKE 1 TABLET TWICE A DAY WITH OR IMMEDIATELY FOLLOWING A MEAL (NEED TO SCHEDULE APPT WITH OUR OFFICE AT [phone] FOR FUTURE REFILLS) (Patient taking differently: Take 100 mg by mouth 2 (two) times daily. )    12 system ROS was negative unless otherwise noted in HPI   Observations/Objective: Carotid duplex evaluated from today.  This shows widely patent endarterectomy on the right and no significant stenosis on the left Assessment and Plan: Stable status post right carotid endarterectomy for symptomatic disease Follow Up Instructions:  We will repeat carotid duplex and office visit in 1 year.  He knows to present immediately to the emergency room should he develop any new neurologic deficits   I discussed the assessment and treatment plan with the patient. The patient was provided an opportunity to ask questions and all were answered. The patient agreed with the  plan and demonstrated an understanding of the instructions.   The patient was advised to call back or seek an in-person evaluation if the symptoms worsen or if the condition fails to improve as anticipated.  I spent 10 minutes with the patient via telephone encounter.   Milagros LollSigned,   [facility] Office: [phone]  12/08/2018, 4:42 PM

## 2018-12-08 NOTE — Telephone Encounter (Signed)
Virtual Visit Pre-Appointment Phone Call  Today, I spoke with [name] [name] and performed the following actions:  1. I explained that we are currently trying to limit exposure to the COVID-19 virus by seeing patients at home rather than in the office.  I explained that the visits are best done by video, but can be done by telephone.  I asked the patient if a virtual visit that the patient would like to try instead of coming into the office. [name] [name] agreed to proceed with the virtual visit scheduled with [name] Jews MD  on 12/08/18.    2. I confirmed the BEST phone number to call the day of the visit and- I included this in appointment notes.  3. I asked if the patient had access to (through a family member/friend) a smartphone with video capability to be used for his visit?"  The patient said yes -   4. I confirmed consent by  a. sending through [place] or by email the [place] as written at the end of this message or  b. verbally as listed below. i. This visit is being performed in the setting of COVID-19. ii. All virtual visits are billed to your insurance company just like a normal visit would be.   iii. We'd like you to understand that the technology does not allow for your provider to perform an examination, and thus may limit your provider's ability to fully assess your condition.  iv. If your provider identifies any concerns that need to be evaluated in person, we will make arrangements to do so.   v. Finally, though the technology is pretty good, we cannot assure that it will always work on either your or our end, and in the setting of a video visit, we may have to convert it to a phone-only visit.  In either situation, we cannot ensure that we have a secure connection.   vi. Are you willing to proceed?"  STAFF: Did the patient verbally acknowledge consent to telehealth visit? Document YES/NO here: YES  2. I advised the patient to be  prepared - I asked that the patient, on the day of his visit, record any information possible with the equipment at his home, such as blood pressure, pulse, oxygen saturation, and your weight and write them all down. I asked the patient to have a pen and paper handy nearby the day of the visit as well.  3. If the patient was scheduled for a video visit, I informed the patient that the visit with the doctor would start with a text to the smartphone # given to [place] by the patient.         If the patient was scheduled for a telephone call, I informed the patient that the visit with the doctor would start with a call to the telephone # given to [place] by the patient.  4. I Informed patient they will receive a phone call 15 minutes prior to their appointment time from a [place] or nurse to review medications, allergies, etc. to prepare for the visit.    TELEPHONE CALL NOTE  [name] [name] has been deemed a candidate for a follow-up tele-health visit to limit community exposure during the Covid-19 pandemic. I spoke with the patient via phone to ensure availability of phone/video source, confirm preferred email & phone number, and discuss instructions and expectations.  I reminded [name] [name] to be prepared with any vital sign and/or  heart rhythm information that could potentially be obtained via home monitoring, at the time of his visit. I reminded [name] [name] to expect a phone call prior to his visit.  Rudi CocoLathan,  M, NT 12/08/2018 10:29 AM     FULL LENGTH CONSENT FOR TELE-HEALTH VISIT   I hereby voluntarily request, consent and authorize [facility] and its employed or contracted physicians, physician assistants, nurse practitioners or other licensed health care professionals (the Practitioner), to provide me with telemedicine health care services (the "Services") as deemed necessary by the treating Practitioner. I acknowledge and consent to receive the Services by the Practitioner via  telemedicine. I understand that the telemedicine visit will involve communicating with the Practitioner through live audiovisual communication technology and the disclosure of certain medical information by electronic transmission. I acknowledge that I have been given the opportunity to request an in-person assessment or other available alternative prior to the telemedicine visit and am voluntarily participating in the telemedicine visit.  I understand that I have the right to withhold or withdraw my consent to the use of telemedicine in the course of my care at any time, without affecting my right to future care or treatment, and that the Practitioner or I may terminate the telemedicine visit at any time. I understand that I have the right to inspect all information obtained and/or recorded in the course of the telemedicine visit and may receive copies of available information for a reasonable fee.  I understand that some of the potential risks of receiving the Services via telemedicine include:  Marland Kitchen. Delay or interruption in medical evaluation due to technological equipment failure or disruption; . Information transmitted may not be sufficient (e.g. poor resolution of images) to allow for appropriate medical decision making by the Practitioner; and/or  . In rare instances, security protocols could fail, causing a breach of personal health information.  Furthermore, I acknowledge that it is my responsibility to provide information about my medical history, conditions and care that is complete and accurate to the best of my ability. I acknowledge that Practitioner's advice, recommendations, and/or decision may be based on factors not within their control, such as incomplete or inaccurate data provided by me or distortions of diagnostic images or specimens that may result from electronic transmissions. I understand that the practice of medicine is not an exact science and that Practitioner makes no warranties or  guarantees regarding treatment outcomes. I acknowledge that I will receive a copy of this consent concurrently upon execution via email to the email address I last provided but may also request a printed copy by calling the office of [facility].    I understand that my insurance will be billed for this visit.   I have read or had this consent read to me. . I understand the contents of this consent, which adequately explains the benefits and risks of the Services being provided via telemedicine.  . I have been provided ample opportunity to ask questions regarding this consent and the Services and have had my questions answered to my satisfaction. . I give my informed consent for the services to be provided through the use of telemedicine in my medical care  By participating in this telemedicine visit I agree to the above..Marland Kitchen

## 2019-02-20 ENCOUNTER — Other Ambulatory Visit (HOSPITAL_COMMUNITY): Payer: Self-pay | Admitting: Internal Medicine

## 2019-02-25 ENCOUNTER — Other Ambulatory Visit (HOSPITAL_COMMUNITY): Payer: Self-pay | Admitting: Internal Medicine

## 2019-03-24 ENCOUNTER — Ambulatory Visit (HOSPITAL_COMMUNITY)
Admission: RE | Admit: 2019-03-24 | Discharge: 2019-03-24 | Disposition: A | Discharge status: Home / Self Care | Payer: BC Managed Care – PPO | Source: Ambulatory Visit | Attending: Internal Medicine | Admitting: Internal Medicine

## 2019-03-24 ENCOUNTER — Encounter (HOSPITAL_COMMUNITY): Payer: Self-pay | Admitting: Internal Medicine

## 2019-03-24 ENCOUNTER — Other Ambulatory Visit: Payer: Self-pay

## 2019-03-24 VITALS — BP 130/75 | HR 66 | Wt 203.4 lb

## 2019-03-24 DIAGNOSIS — Z841 Family history of disorders of kidney and ureter: Secondary | ICD-10-CM | POA: Insufficient documentation

## 2019-03-24 DIAGNOSIS — E785 Hyperlipidemia, unspecified: Secondary | ICD-10-CM | POA: Diagnosis not present

## 2019-03-24 DIAGNOSIS — I10 Essential (primary) hypertension: Secondary | ICD-10-CM | POA: Diagnosis not present

## 2019-03-24 DIAGNOSIS — I110 Hypertensive heart disease with heart failure: Secondary | ICD-10-CM | POA: Insufficient documentation

## 2019-03-24 DIAGNOSIS — I6529 Occlusion and stenosis of unspecified carotid artery: Secondary | ICD-10-CM | POA: Insufficient documentation

## 2019-03-24 DIAGNOSIS — Z79899 Other long term (current) drug therapy: Secondary | ICD-10-CM | POA: Insufficient documentation

## 2019-03-24 DIAGNOSIS — Z7982 Long term (current) use of aspirin: Secondary | ICD-10-CM | POA: Insufficient documentation

## 2019-03-24 DIAGNOSIS — I509 Heart failure, unspecified: Secondary | ICD-10-CM | POA: Diagnosis not present

## 2019-03-24 DIAGNOSIS — I440 Atrioventricular block, first degree: Secondary | ICD-10-CM | POA: Insufficient documentation

## 2019-03-24 DIAGNOSIS — M1990 Unspecified osteoarthritis, unspecified site: Secondary | ICD-10-CM | POA: Insufficient documentation

## 2019-03-24 DIAGNOSIS — Z807 Family history of other malignant neoplasms of lymphoid, hematopoietic and related tissues: Secondary | ICD-10-CM | POA: Diagnosis not present

## 2019-03-24 DIAGNOSIS — I421 Obstructive hypertrophic cardiomyopathy: Secondary | ICD-10-CM | POA: Diagnosis not present

## 2019-03-24 DIAGNOSIS — Z8052 Family history of malignant neoplasm of bladder: Secondary | ICD-10-CM | POA: Diagnosis not present

## 2019-03-24 NOTE — Addendum Note (Signed)
Encounter addended by: Scarlette Calico, RN on: 03/24/2019 3:48 PM  Actions taken: Letter saved, Clinical Note Signed

## 2019-03-24 NOTE — Progress Notes (Signed)
Patient ID: BRANDY KABAT, male   DOB: April 12, 1961, 58 y.o.   MRN: [id number]   Advanced Heart Failure Clinic Note   Date:  03/24/2019   ID:  KAYVION ARNESON, DOB March 21, 1961, MRN [id number]  PCP:  Arsenio Loader, FNP  Cardiologist:  Dr. Glori Bickers     History of Present Illness: [name] [name] is a 58 y.o. male with a hx of HOCM, status post septal myomectomy in 04/2006 at [facility], mild mitral regurgitation, HTN, HL with a low HDL. (cardiac cath 2007 with normal coronaries) He was admitted to [facility] in 3/12 with syncope and atypical chest discomfort. He saw one of our cardiologists there. He had mildly elevated troponins. This was likely related to strain from his LVH. His CK-MB's were negative. Echo (06/2010): moderate to severe LVH and EF of 65%, resting LVOT peak velocity was calculated at 2.21 m/s. A Myoview study demonstrated a small, fixed defect in the apical septal segment. There was a small, reversible defect in the mid anterolateral, apical lateral segments. Findings were similar to a prior study in 05/2009. Mild apical lateral ischemia could not be ruled out and his EF was 53%.   He saw Richardson Dopp in f/u 03/2012.  F/u echo (03/2012):  Severe LVH, EF 80%, LVOT peak velocity 1.33 m/s, septal thickness 21 mm, Gr 2 DD, mild LAE.  He has been doing well since last seen. He denies chest pain, syncope, PND, edema, dyspnea.   Had ETT 04/04/14: 12:00 min [name] Protocol. 13.1 METS Peak HR 141 BP 159/84 -> 203/100  He presents today for annual follow up. 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 Feels good. 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 No CP or SOB. No palpitations, syncope or presyncope.   Review of systems complete and found to be negative unless listed in HPI.   Recent Labs: No results found for requested labs within last 8760 hours.  Wt Readings from Last 3 Encounters:  03/24/19 92.3 kg (203 lb 6.4 oz)   12/08/18 88.5 kg (195 lb)  03/10/18 90.3 kg (199 lb)     Past Medical History:  Diagnosis Date  . Arthritis    "some in my hands" (02/25/2018)  . Carotid artery stenosis, asymptomatic, right   . Chest pain     -Lexiscan Myoview 3/12: ? small apical lateral ischemia, EF 53%; similar to study in 2/11  . CHF (congestive heart failure) (HCC)    hx  . Erectile dysfunction   . GERD (gastroesophageal reflux disease)   . Heart murmur    hx  . HOCM (hypertrophic obstructive cardiomyopathy) ([place])     --cardiac cath 2007. nomrmal cors;  s/p myomectomy 2008;  Echo 3/12: mod to severe LVH, HCM, EF 65%, resting LVOT peak 2.21 m/s, mild LAE, mild MR, AV mean 9;  Echo 12/13: severe LVH, EF 80%, Gr 2 diast dsyfn, mild LAE, mild RVE  . HTN (hypertension)   . Hyperlipidemia     Current Outpatient Medications  Medication Sig Dispense Refill  . aspirin 81 MG tablet Take 4 tablets (325 mg total) by mouth daily.    Marland Kitchen atorvastatin (LIPITOR) 40 MG tablet Take 1 tablet (40 mg total) by mouth daily. 30 tablet 6  . hydrochlorothiazide (HYDRODIURIL) 25 MG tablet TAKE 1 TABLET DAILY (NEEDS TO SCHEDULE AN APPOINTMENT WITH OUR OFFICE AT [phone] FOR FUTURE REFILLS) 90 tablet 3  . losartan (COZAAR) 100 MG tablet TAKE 1 TABLET DAILY 90 tablet  0  . metoprolol succinate (TOPROL-XL) 100 MG 24 hr tablet TAKE 1 TABLET TWICE A DAY WITH OR IMMEDIATELY FOLLOWING A MEAL (NEED TO SCHEDULE APPT WITH OUR OFFICE AT [phone] FOR FUTURE REFILLS) 180 tablet 3   No current facility-administered medications for this encounter.     Allergies:   Patient has no known allergies.   Social History:  The patient  reports that he has never smoked. He has never used smokeless tobacco. He reports previous alcohol use. He reports that he does not use drugs.   Family History:  The patient's family history includes Autism in his son; Bladder Cancer in his father; Kidney failure in his mother; Multiple myeloma in his mother;  Non-Hodgkin's lymphoma in his father.     PHYSICAL EXAM: VS:  BP 130/75   Pulse 66   Wt 92.3 kg (203 lb 6.4 oz)   SpO2 97%   BMI 36.03 kg/m    Wt Readings from Last 3 Encounters:  03/24/19 92.3 kg (203 lb 6.4 oz)  12/08/18 88.5 kg (195 lb)  03/10/18 90.3 kg (199 lb)   General:  Well appearing. No resp difficulty HEENT: normal Neck: supple. no JVD. Carotids 2+ bilat; no bruits. No lymphadenopathy or thryomegaly appreciated. Cor: PMI nondisplaced. Regular rate & rhythm. No rubs, gallops or murmurs. Lungs: clear Abdomen: soft, nontender, nondistended. No hepatosplenomegaly. No bruits or masses. Good bowel sounds. Extremities: no cyanosis, clubbing, rash, edema Neuro: alert & orientedx3, cranial nerves grossly intact. moves all 4 extremities w/o difficulty. Affect pleasant  EKG: Sinus brady 65 bpm with 1st degree aV block (216 ms), LVH with IVCD (120 ms) Personally reviewed    ASSESSMENT AND PLAN:  1. Hypertrophic Obstructive Cardiomyopathy s/p septal myomectomy in January 2008 at [facility]. Asymptomatic. ETT in 12/15 with excellent exercise tolerance. No arrhythmias and good BP response to exercise. He has not had any high-risk features for SCD and does not meet cirteria ICD. - Echo 9/19: No obstruction. No SAM, mild MR, LA moderately dilated          - He remains very active without any cardiac symptoms. 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           - We did discuss need for screening of children every 5 years which they are doing.            - Continue Toprol XL 100 mg daily  2.   Hypertension:  -Blood pressure well controlled. Continue current regimen.  3. Carotid stenosis with stroke like symptoms - s/p R CEA in 02/2018. Followed by [facility]  - Decrease ASA to 81 mg daily - Continue atorvastatin 40 mg daily   Glori Bickers, MD 3:42 PM

## 2019-03-24 NOTE — Patient Instructions (Signed)
Call us in 1 year to schedule your next appointment

## 2019-05-26 ENCOUNTER — Other Ambulatory Visit (HOSPITAL_COMMUNITY): Payer: Self-pay | Admitting: Internal Medicine

## 2019-05-27 IMAGING — US US CAROTID DUPLEX BILAT
1 series · 13 of 24 positions shown · non-contrast
Comparison: None.

CLINICAL DATA: Carotid stenosis. History of hypertension and
hyperlipidemia. History of cardiomyopathy and CHF.

EXAM:
BILATERAL CAROTID DUPLEX ULTRASOUND
TECHNIQUE: Gray scale imaging, color Doppler and duplex ultrasound were
performed of bilateral carotid and vertebral arteries in the neck.

[Series 1: us carotid duplex bilat · 0.06mm/px · 13 of 81 slices shown]
[im 1/81]
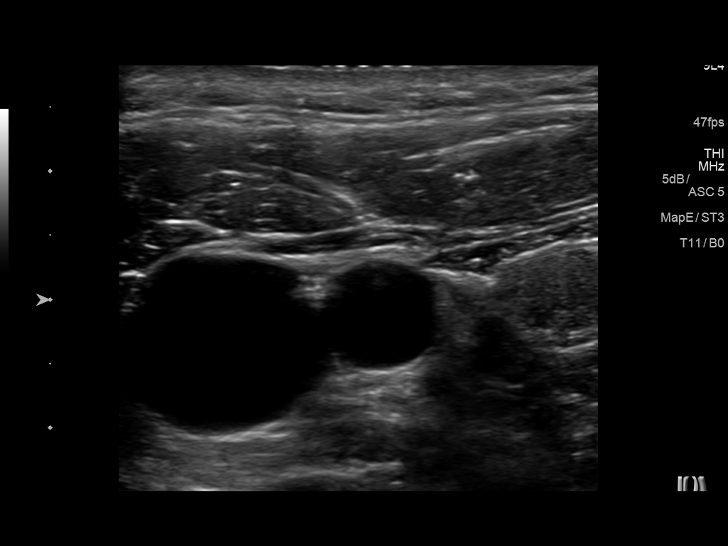
[im 7/81]
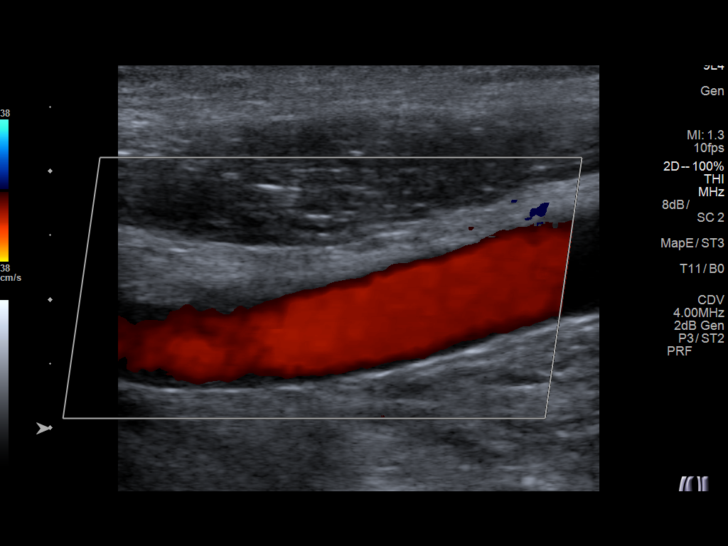
[im 14/81]
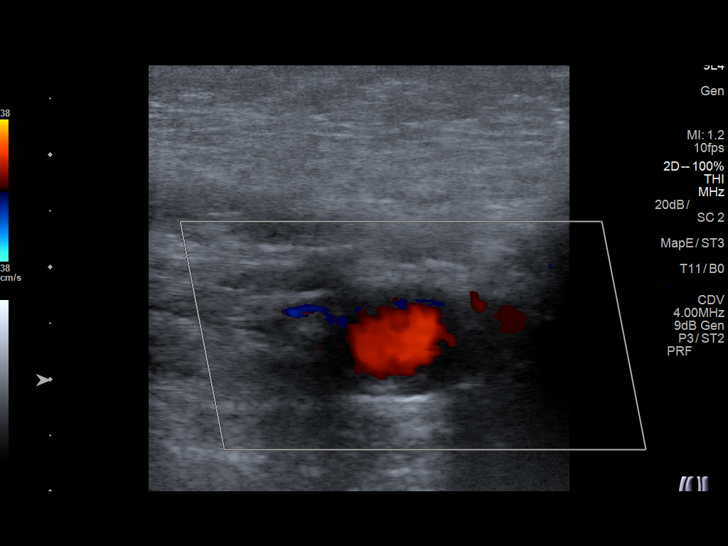
[im 21/81]
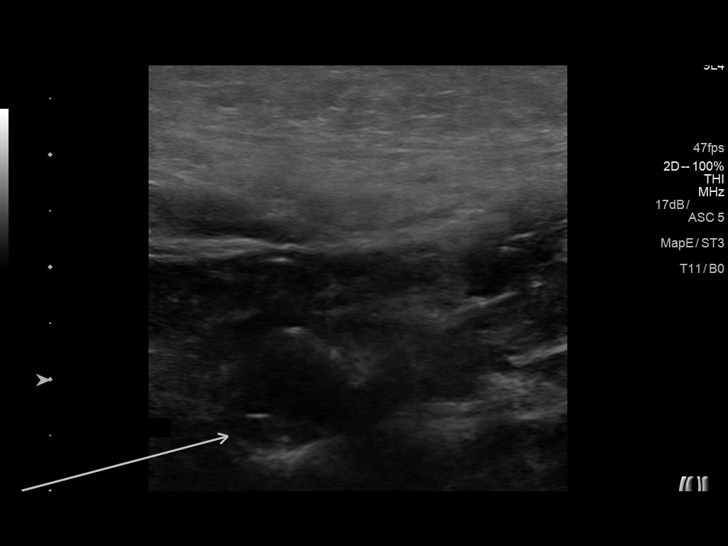
[im 28/81]
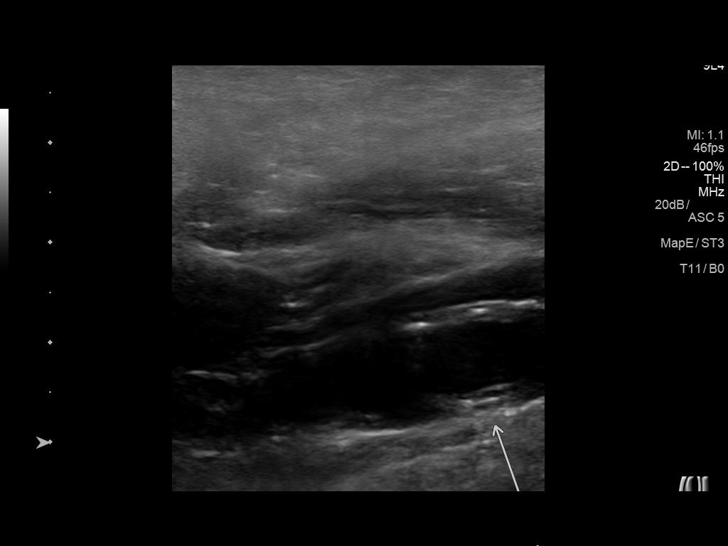
[im 35/81]
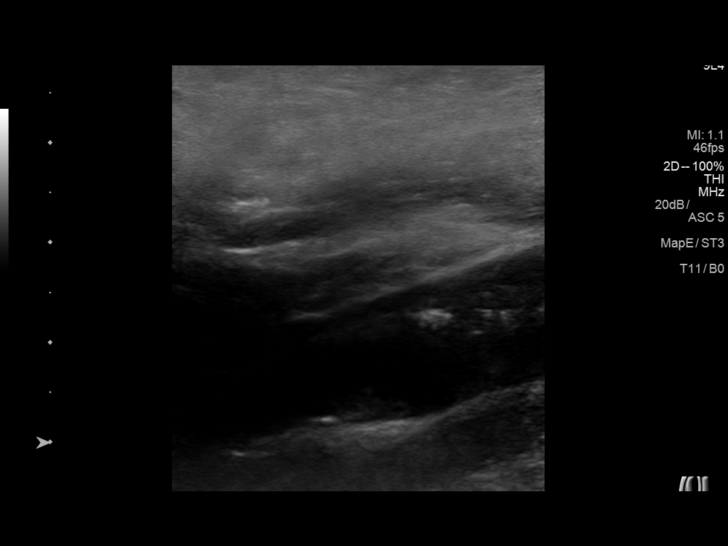
[im 42/81]
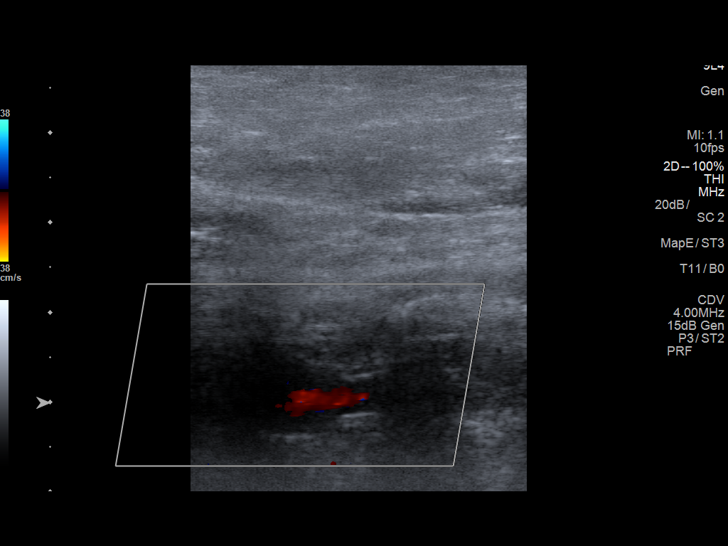
[im 46/81]
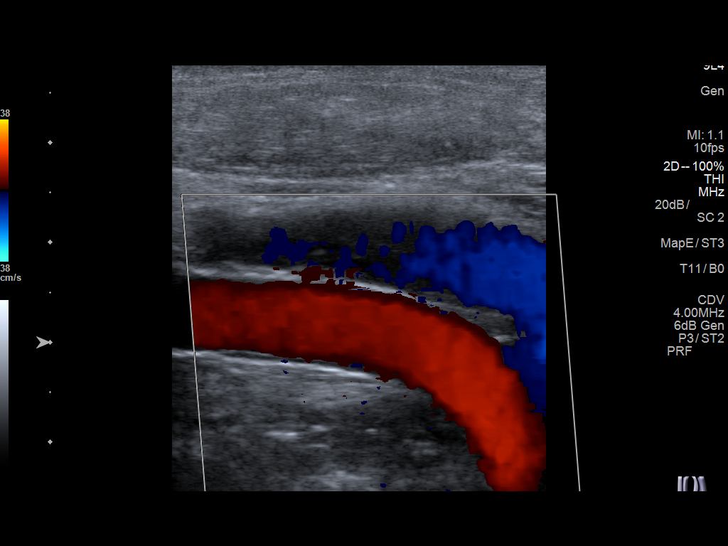
[im 53/81]
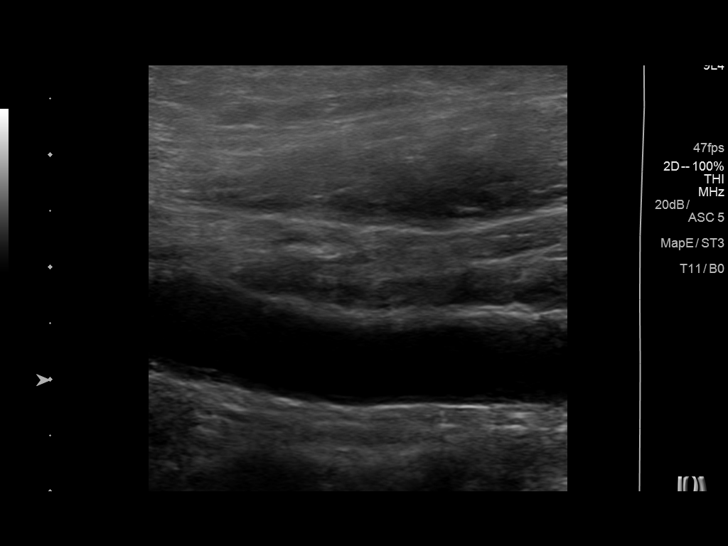
[im 60/81]
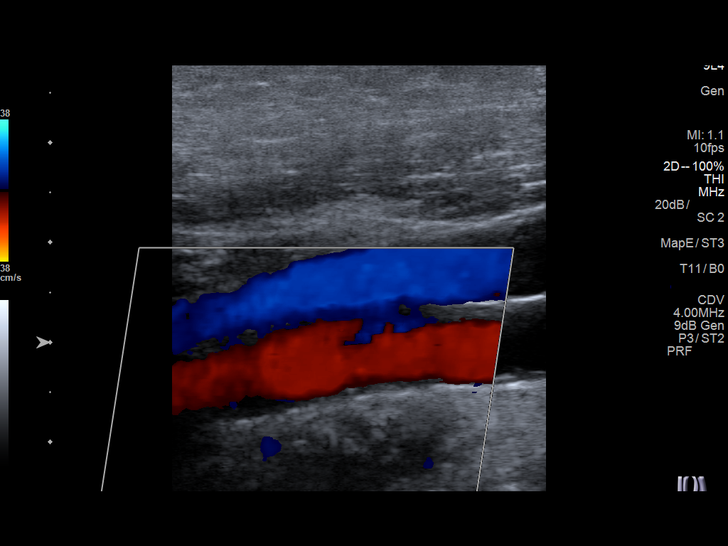
[im 67/81]
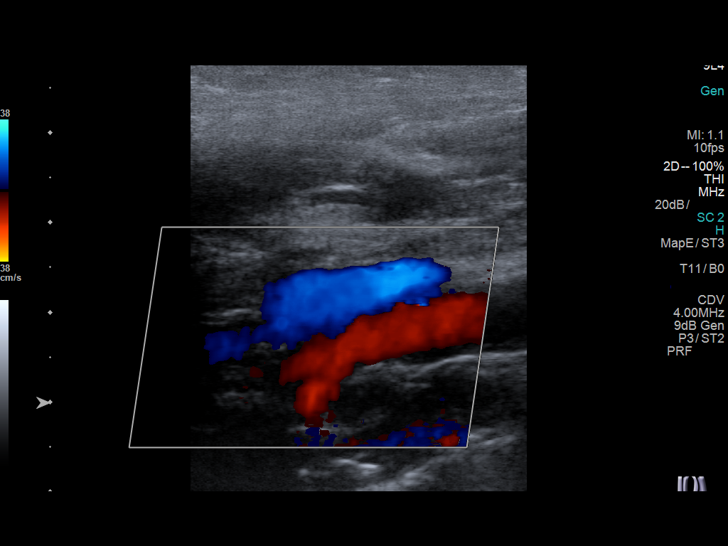
[im 74/81]
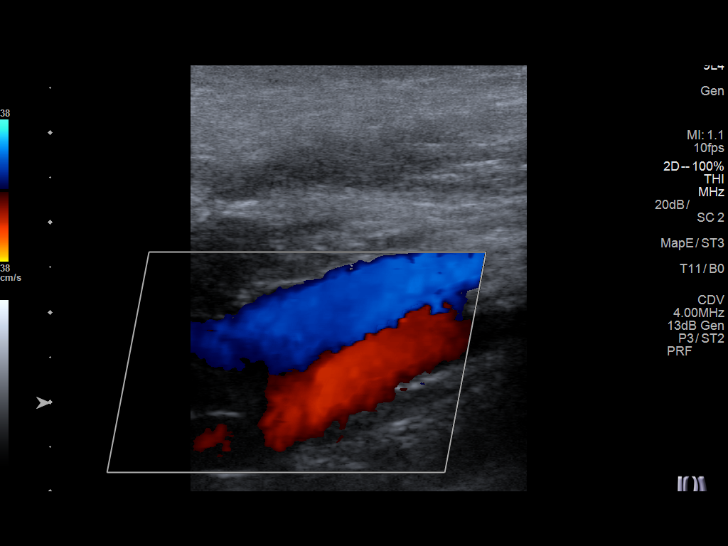
[im 81/81]
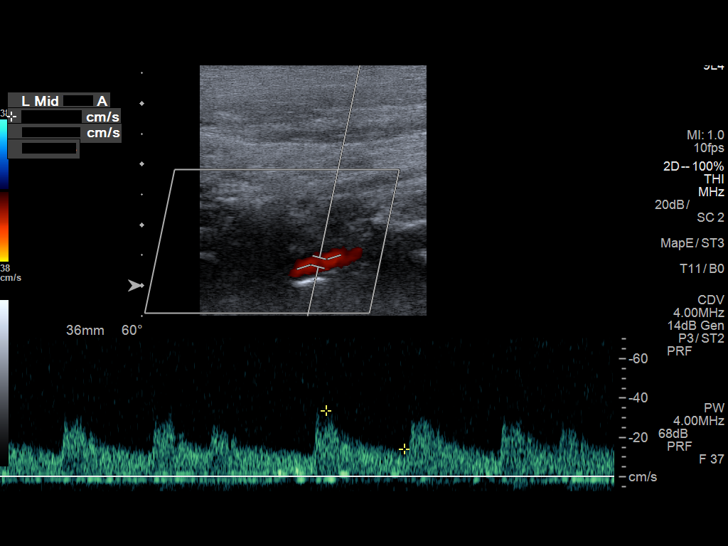

[13 of 24 positions shown; findings below may reference images not displayed]

FINDINGS: Criteria: Quantification of carotid stenosis is based on velocity
parameters that correlate the residual internal carotid diameter
with NASCET-based stenosis levels, using the diameter of the distal
internal carotid lumen as the denominator for stenosis measurement.

The following velocity measurements were obtained:

RIGHT

ICA:  120/41 cm/sec

CCA:  80/19 cm/sec

SYSTOLIC ICA/CCA RATIO:

ECA:  107 cm/sec

LEFT

ICA:  81/34 cm/sec

CCA:  72/24 cm/sec

SYSTOLIC ICA/CCA RATIO:

ECA:  98 cm/sec

RIGHT CAROTID ARTERY: There is a moderate amount of echogenic plaque
within the right carotid bulb (image 18 and 21), extending to
involve the origin and proximal aspects of the right internal
carotid artery (image 30), not resulting in elevated peak systolic
velocities within the interrogated course of the right internal
carotid artery to suggest a hemodynamically significant stenosis.

RIGHT VERTEBRAL ARTERY:  Antegrade flow

LEFT CAROTID ARTERY: There is a minimal to moderate amount of
echogenic plaque within the left carotid bulb (image 59), extending
to involve the origin and proximal aspects of the left internal
carotid artery (image 72), not resulting in elevated peak systolic
velocities within the interrogated course of the left internal
carotid artery to suggest a hemodynamically significant stenosis.

LEFT VERTEBRAL ARTERY:  Antegrade Flow
IMPRESSION: Moderate amount of bilateral atherosclerotic plaque, right
subjectively greater than left, not resulting in a hemodynamically
significant stenosis within either internal carotid artery.

## 2020-02-02 ENCOUNTER — Telehealth (HOSPITAL_COMMUNITY): Payer: Self-pay | Admitting: *Deleted

## 2020-02-02 NOTE — Telephone Encounter (Signed)
Pt left VM stating he recently tested positive for covid and is having a hard time regaining strength [ts PCP notice he now has a heart murmur and wants pt to have a f/u appt with Dr.Bensimhon. per Herbert Seta schedule pt for 11/4 at 3:20pm. Pt aware and appt scheduled.

## 2020-02-04 NOTE — Telephone Encounter (Signed)
Agree. He should go to ER if not feeling well for testing. Should not be scheduled in Clinic for COVID eval.

## 2020-02-04 NOTE — Telephone Encounter (Signed)
Patients wife called the office to inform us patient tested positive for covid on 9/28. Pt is having difficulty breathing and was seen by pcp and was told he needed an asap appt w/Dr.Bensimhon for blood clots and to get an echo. Pt called yesterday and Heather scheduled pt for 11/4 but wife said patient really isn't feeling well. I advised her pt needed to be evaluated in the emergency room if he is still having bad symptoms from covid. pts wife asked that I get advice from Dr.Bensimhon.  Routed to Dr.Bensimhon

## 2020-02-06 IMAGING — DX DG CHEST 2V
2 series · 2 of 2 positions shown · non-contrast
Comparison: 05/29/2009

CLINICAL DATA: Stroke-like symptoms, LEFT-sided weakness, LEFT eye
blurry vision, LEFT jaw numbness, elevated troponins, history
hypertrophic obstructive cardiomyopathy, hypertension

EXAM:
CHEST - 2 VIEW

[chest pa]
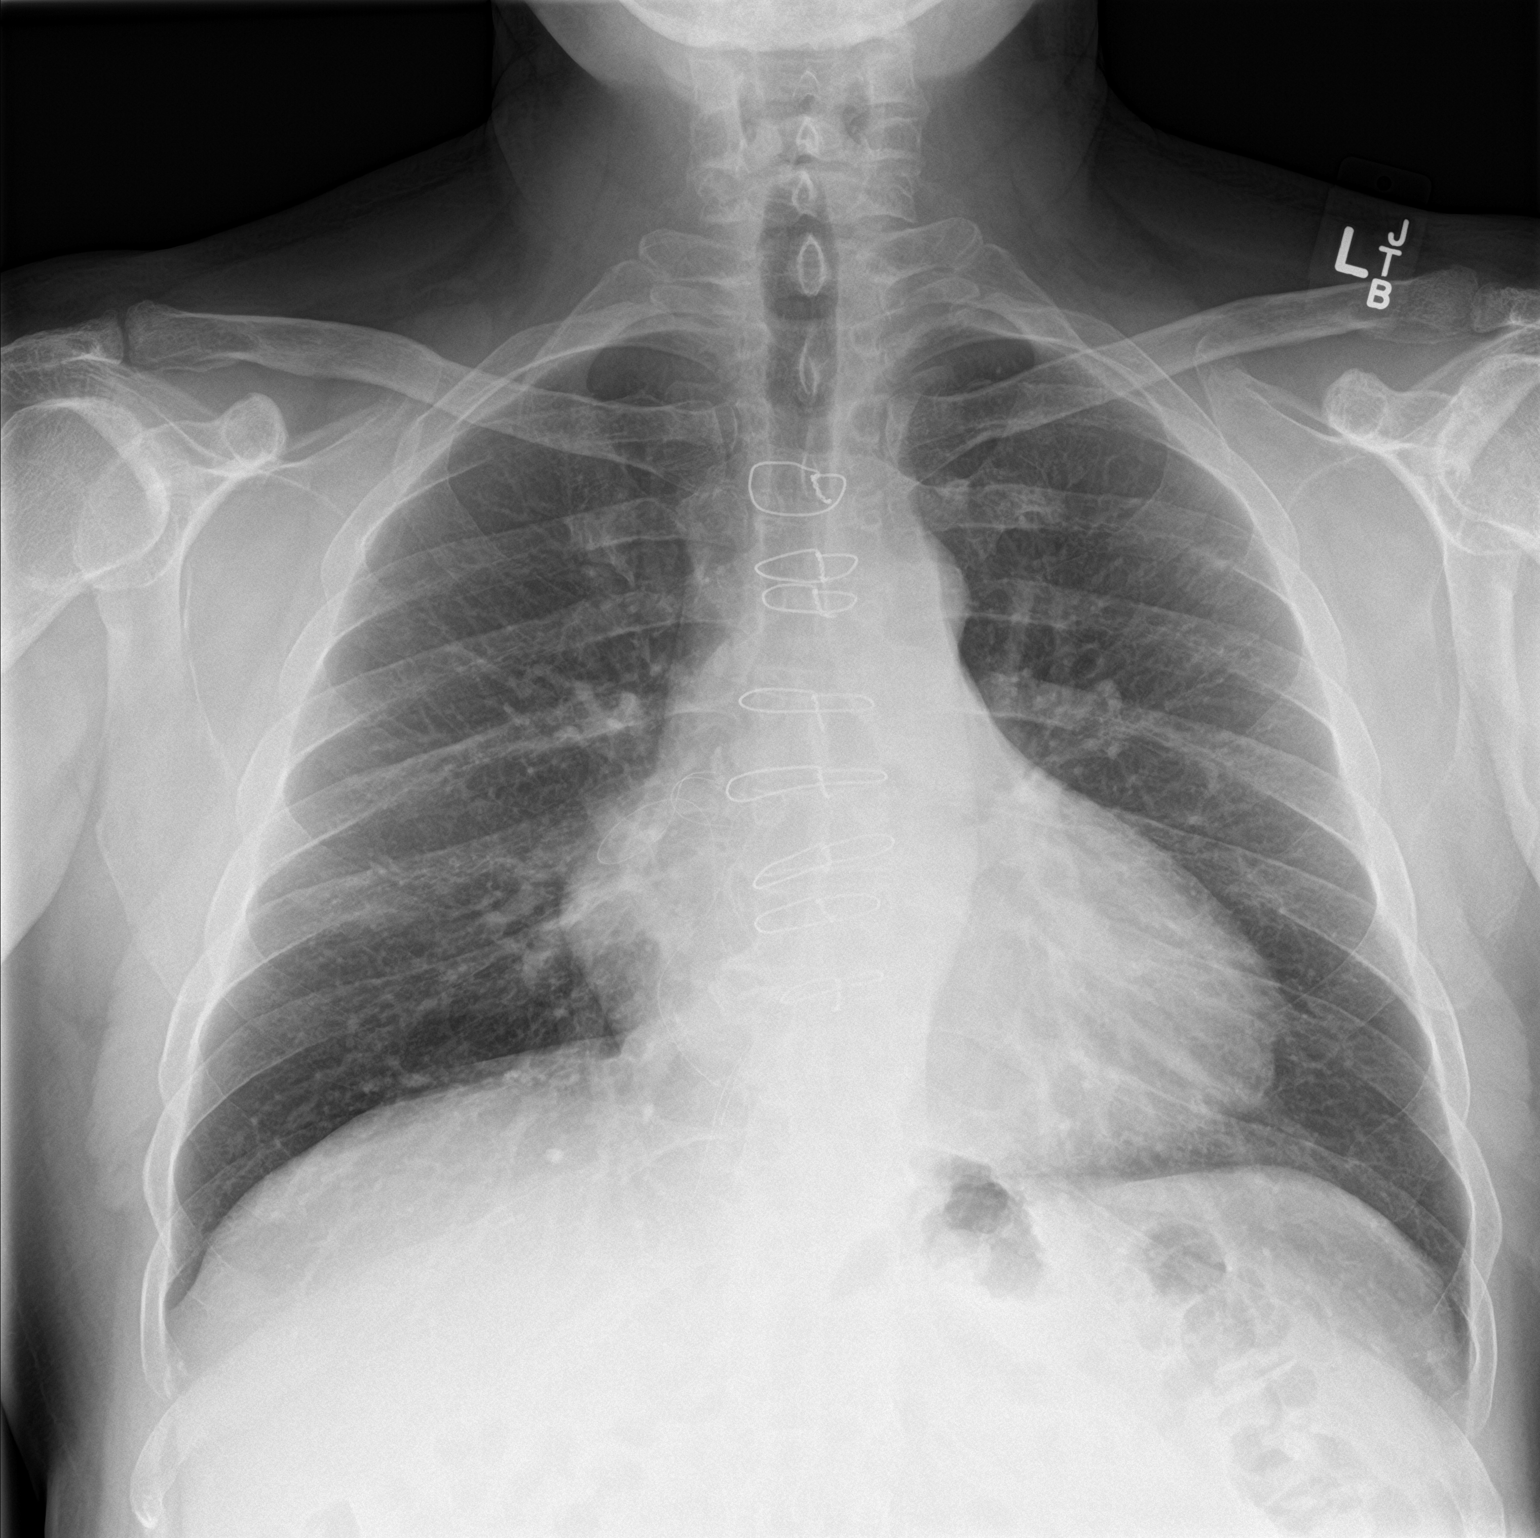

[chest lat]
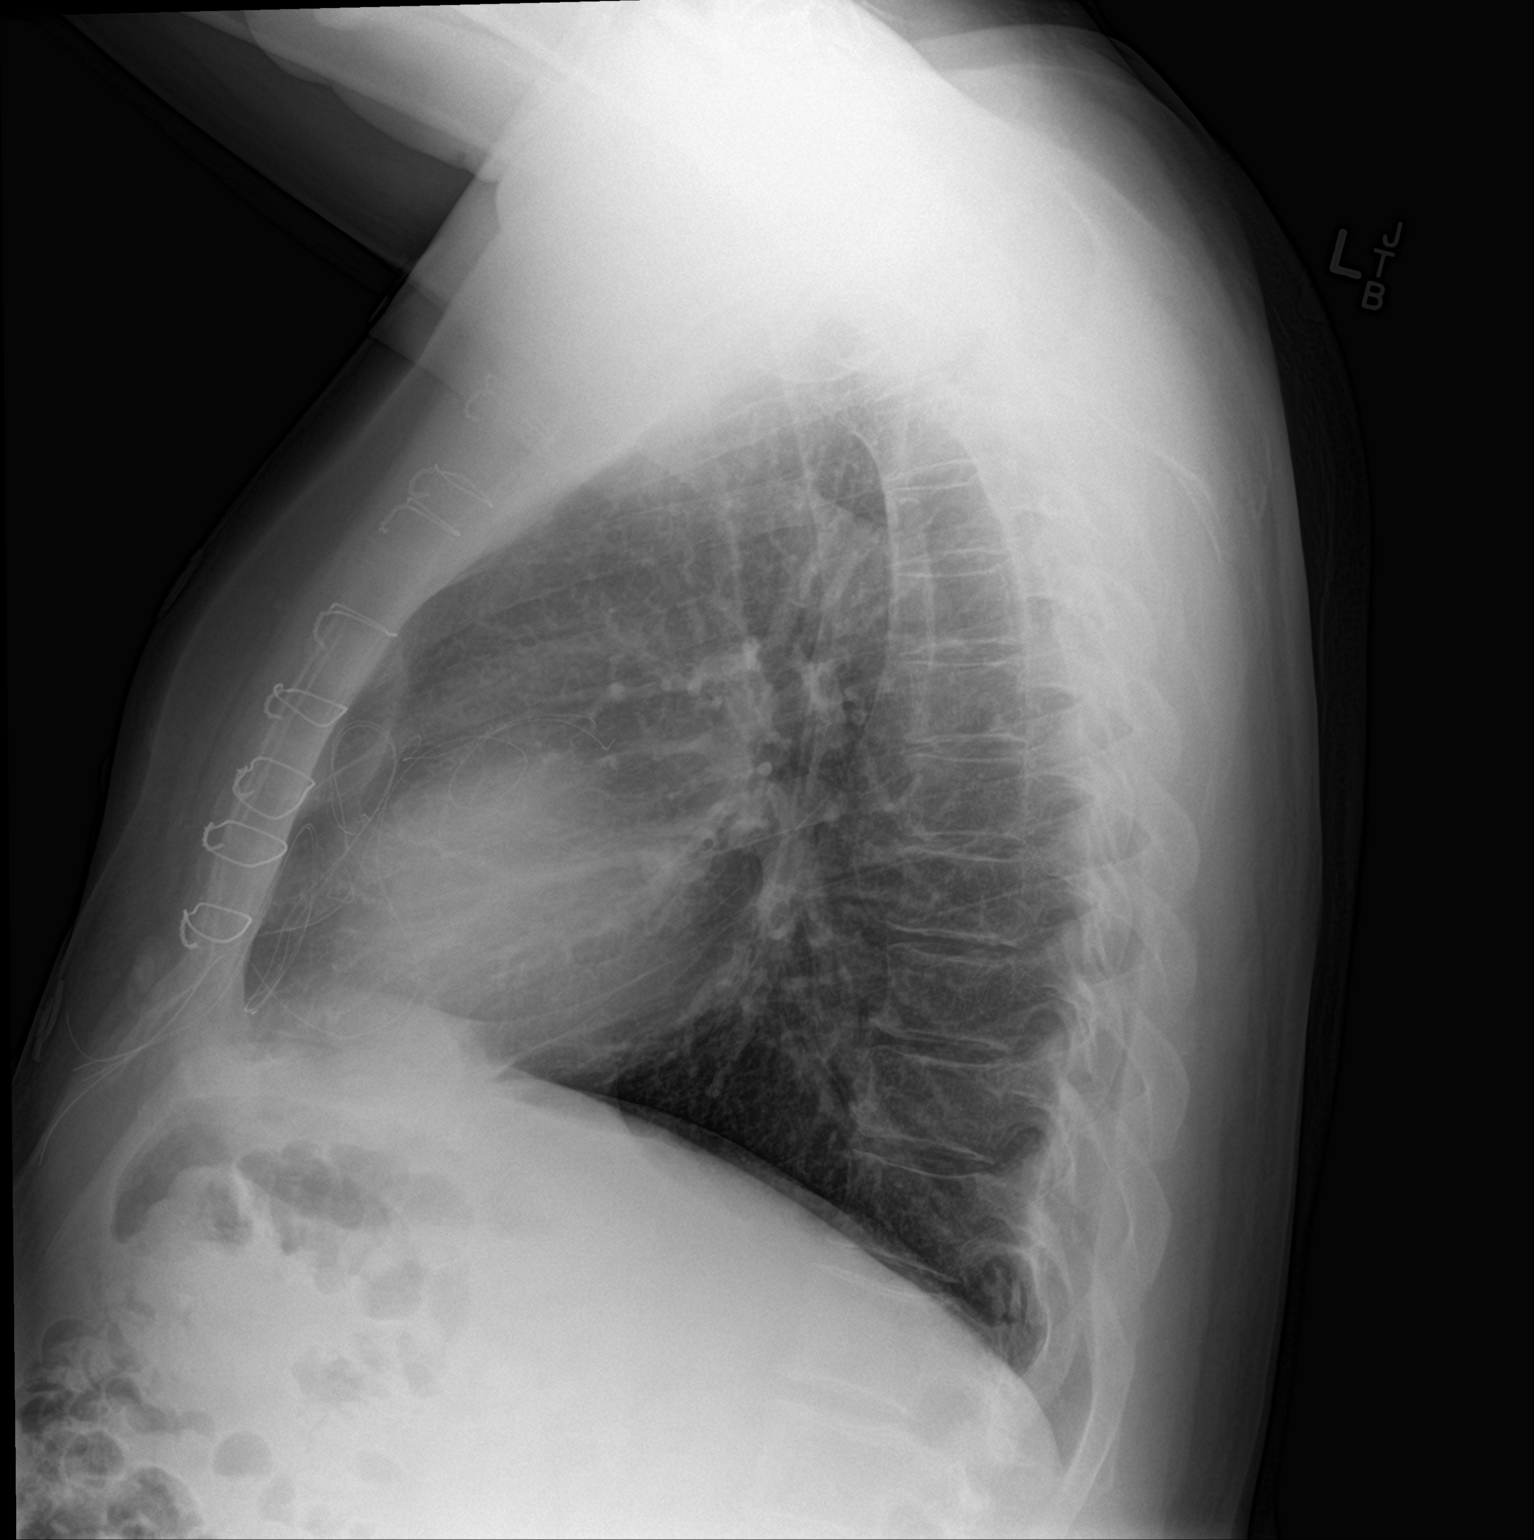

[2 of 2 positions shown; findings below may reference images not displayed]

FINDINGS: Enlargement of cardiac silhouette post CABG.

Epicardial pacing wires noted.

Mediastinal contours and pulmonary vascularity normal.

Lungs clear.

No pleural effusion or pneumothorax.

Bones demineralized.
IMPRESSION: Enlargement of cardiac silhouette post CABG with note of epicardial
pacing wires.

No acute abnormalities.

## 2020-02-07 NOTE — Telephone Encounter (Signed)
Called [phone] no answer/voice mailbox full. Will try again later.

## 2020-02-21 ENCOUNTER — Other Ambulatory Visit (HOSPITAL_COMMUNITY): Payer: Self-pay | Admitting: Internal Medicine

## 2020-02-24 ENCOUNTER — Ambulatory Visit (HOSPITAL_COMMUNITY)
Admission: RE | Admit: 2020-02-24 | Discharge: 2020-02-24 | Disposition: A | Discharge status: Home / Self Care | Payer: BC Managed Care – PPO | Source: Ambulatory Visit | Attending: Internal Medicine | Admitting: Internal Medicine

## 2020-02-24 ENCOUNTER — Other Ambulatory Visit: Payer: Self-pay

## 2020-02-24 VITALS — BP 136/86 | HR 70 | Wt 194.4 lb

## 2020-02-24 DIAGNOSIS — R053 Chronic cough: Secondary | ICD-10-CM | POA: Diagnosis not present

## 2020-02-24 DIAGNOSIS — Z8616 Personal history of COVID-19: Secondary | ICD-10-CM | POA: Insufficient documentation

## 2020-02-24 DIAGNOSIS — I10 Essential (primary) hypertension: Secondary | ICD-10-CM | POA: Diagnosis not present

## 2020-02-24 DIAGNOSIS — U099 Post covid-19 condition, unspecified: Secondary | ICD-10-CM

## 2020-02-24 DIAGNOSIS — I421 Obstructive hypertrophic cardiomyopathy: Secondary | ICD-10-CM | POA: Insufficient documentation

## 2020-02-24 DIAGNOSIS — R0600 Dyspnea, unspecified: Secondary | ICD-10-CM | POA: Diagnosis not present

## 2020-02-24 DIAGNOSIS — Z7982 Long term (current) use of aspirin: Secondary | ICD-10-CM | POA: Insufficient documentation

## 2020-02-24 DIAGNOSIS — I509 Heart failure, unspecified: Secondary | ICD-10-CM | POA: Insufficient documentation

## 2020-02-24 DIAGNOSIS — Z79899 Other long term (current) drug therapy: Secondary | ICD-10-CM | POA: Diagnosis not present

## 2020-02-24 DIAGNOSIS — I6521 Occlusion and stenosis of right carotid artery: Secondary | ICD-10-CM | POA: Insufficient documentation

## 2020-02-24 DIAGNOSIS — I110 Hypertensive heart disease with heart failure: Secondary | ICD-10-CM | POA: Insufficient documentation

## 2020-02-24 LAB — TROPONIN I (HIGH SENSITIVITY): Troponin I (High Sensitivity): 35 ng/L — ABNORMAL HIGH (ref <18)

## 2020-02-24 NOTE — Addendum Note (Signed)
Encounter addended by: Noralee Space, RN on: 02/24/2020 4:38 PM  Actions taken: Order list changed, Diagnosis association updated, Clinical Note Signed, Charge Capture section accepted

## 2020-02-24 NOTE — Progress Notes (Signed)
Patient ID: [name] [name], male   DOB: 04-Mar-1961, 59 y.o.   MRN: [id number]   Advanced Heart Failure Clinic Note   Date:  02/24/2020   ID:  [name] [name], DOB 12-26-1960, MRN [id number]  PCP:  [name] Loader, FNP  Cardiologist:  Dr. Glori [name]     History of Present Illness: [name] [name] is a 59 y.o. male with a hx of HOCM, status post septal myomectomy in 04/2006 at [facility], mild mitral regurgitation, HTN, HL with a low HDL. (cardiac cath 2007 with normal coronaries) He was admitted to [facility] in 3/12 with syncope and atypical chest discomfort. He saw one of our cardiologists there. He had mildly elevated troponins. This was likely related to strain from his LVH. His CK-MB's were negative. Echo (06/2010): moderate to severe LVH and EF of 65%, resting LVOT peak velocity was calculated at 2.21 m/s. A Myoview study demonstrated a small, fixed defect in the apical septal segment. There was a small, reversible defect in the mid anterolateral, apical lateral segments. Findings were similar to a prior study in 05/2009. Mild apical lateral ischemia could not be ruled out and his EF was 53%.   He saw [name] [name] in f/u 03/2012.  F/u echo (03/2012):  Severe LVH, EF 80%, LVOT peak velocity 1.33 m/s, septal thickness 21 mm, Gr 2 DD, mild LAE.  He has been doing well since last seen. He denies chest pain, syncope, PND, edema, dyspnea.   Had ETT 04/04/14: 12:00 min [name] Protocol. 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  He presents today for annual follow up. Had [place] in late September and early October. Felt terrible. Near the end of the quarantine period had episode of syncope and was seen in ER and was ok. On October 12 saw his PCP. BNP and d-dimer elevated. Had CTA chest. No PE but was told he has double PNA. Treated with Levaquin. Breathing and cough improved but remains fatigued.Still with night sweats.  Has not been able to return to work.   Review of systems complete and found to be negative unless listed in HPI.   Recent Labs: No results found for requested labs within last 8760 hours.  Wt Readings from Last 3 Encounters:  02/24/20 88.2 kg (194 lb 6 oz)  03/24/19 92.3 kg (203 lb 6.4 oz)  12/08/18 88.5 kg (195 lb)     Past Medical History:  Diagnosis Date  . Arthritis    "some in my hands" (02/25/2018)  . Carotid artery stenosis, asymptomatic, right   . Chest pain     -Lexiscan Myoview 3/12: ? small apical lateral ischemia, EF 53%; similar to study in 2/11  . CHF (congestive heart failure) (HCC)    hx  . Erectile dysfunction   . GERD (gastroesophageal reflux disease)   . Heart murmur    hx  . HOCM (hypertrophic obstructive cardiomyopathy) ([place])     --cardiac cath 2007. nomrmal cors;  s/p myomectomy 2008;  Echo 3/12: mod to severe LVH, HCM, EF 65%, resting LVOT peak 2.21 m/s, mild LAE, mild MR, AV mean 9;  Echo 12/13: severe LVH, EF 80%, Gr 2 diast dsyfn, mild LAE, mild RVE  . HTN (hypertension)   . Hyperlipidemia     Current Outpatient Medications  Medication Sig Dispense Refill  . aspirin 81 MG tablet Take 4 tablets (325 mg total) by mouth daily.    . hydrochlorothiazide (  HYDRODIURIL) 25 MG tablet TAKE 1 TABLET DAILY (NEEDS TO SCHEDULE AN APPOINTMENT WITH OUR OFFICE AT [phone] FOR FUTURE REFILLS) 90 tablet 3  . losartan (COZAAR) 100 MG tablet TAKE 1 TABLET DAILY 90 tablet 3  . metoprolol succinate (TOPROL-XL) 100 MG 24 hr tablet TAKE 1 TABLET TWICE A DAY WITH OR IMMEDIATELY FOLLOWING A MEAL (NEED TO SCHEDULE APPT WITH OUR OFFICE AT [phone] FOR FUTURE REFILLS) 180 tablet 3   No current facility-administered medications for this encounter.    Allergies:   Patient has no known allergies.   Social History:  The patient  reports that he has never smoked. He has never used smokeless tobacco. He reports previous alcohol use. He reports that he does not use drugs.    Family History:  The patient's family history includes Autism in his son; Bladder Cancer in his father; Kidney failure in his mother; Multiple myeloma in his mother; Non-Hodgkin's lymphoma in his father.     PHYSICAL EXAM: VS:  BP 136/86   Pulse 70   Wt 88.2 kg (194 lb 6 oz)   SpO2 98%   BMI 34.43 kg/m    Wt Readings from Last 3 Encounters:  02/24/20 88.2 kg (194 lb 6 oz)  03/24/19 92.3 kg (203 lb 6.4 oz)  12/08/18 88.5 kg (195 lb)   General:  Well appearing. No resp difficulty HEENT: normal Neck: supple. no JVD. Carotids 2+ bilat; no bruits. No lymphadenopathy or thryomegaly appreciated. Cor: PMI nondisplaced. Regular rate & rhythm. No rubs, gallops or murmurs. Lungs: clear Abdomen: soft, nontender, nondistended. No hepatosplenomegaly. No bruits or masses. Good bowel sounds. Extremities: no cyanosis, clubbing, rash, edema Neuro: alert & orientedx3, cranial nerves grossly intact. moves all 4 extremities w/o difficulty. Affect pleasant  EKG: Sinus 71 bpm with 1st degree AV block (222 ms), LVH with IVCD (120 ms) + PACs Personally reviewed  ASSESSMENT AND PLAN:  1. Dyspnea with recent COVID PNA - he is improving slowly but still not back to baseline  - will need repeat echo to exclude myocarditis but no evidence of HF on exam  - volume status ok on exam - PCP can refer to post-COVID Clinic if not improving  2. Hypertrophic Obstructive Cardiomyopathy s/p septal myomectomy in January 2008 at [facility]. Asymptomatic. ETT in 12/15 with excellent exercise tolerance. No arrhythmias and good BP response to exercise. He has not had any high-risk features for SCD and does not meet cirteria ICD. - Echo 9/19: No obstruction. No SAM, mild MR, LA moderately dilated - Continue Toprol XL 100 mg bid and losartan 132m daily - No evidence of HF on exam. Echo as above to exclude myocarditis  3. Hypertension:  - Blood pressure well controlled. Continue current regimen.  4.  Carotid stenosis with stroke like symptoms - s/p R CEA in 02/2018. Followed by [facility]  - Decrease ASA to 81 mg daily - Continue atorvastatin 40 mg daily   DGlori Bickers MD 4:22 PM

## 2020-02-24 NOTE — Patient Instructions (Signed)
Labs done today  Your physician has requested that you have an echocardiogram. Echocardiography is a painless test that uses sound waves to create images of your heart. It provides your doctor with information about the size and shape of your heart and how well your heart's chambers and valves are working. This procedure takes approximately one hour. There are no restrictions for this procedure.

## 2020-03-01 ENCOUNTER — Ambulatory Visit (HOSPITAL_COMMUNITY)
Admission: RE | Admit: 2020-03-01 | Discharge: 2020-03-01 | Disposition: A | Discharge status: Home / Self Care | Payer: BC Managed Care – PPO | Source: Ambulatory Visit | Attending: Internal Medicine | Admitting: Internal Medicine

## 2020-03-01 ENCOUNTER — Other Ambulatory Visit: Payer: Self-pay

## 2020-03-01 DIAGNOSIS — I421 Obstructive hypertrophic cardiomyopathy: Secondary | ICD-10-CM | POA: Insufficient documentation

## 2020-03-01 LAB — ECHOCARDIOGRAM COMPLETE
AR max vel: 1.86 cm2
AV Area VTI: 1.96 cm2
AV Area mean vel: 1.87 cm2
AV Mean grad: 5.5 mmHg
AV Peak grad: 11.7 mmHg
Ao pk vel: 1.71 m/s
Area-P 1/2: 4.33 cm2
S' Lateral: 2.63 cm

## 2020-03-01 NOTE — Progress Notes (Signed)
*  PRELIMINARY RESULTS* Echocardiogram 2D Echocardiogram has been performed.  Stacey Drain 03/01/2020, 3:48 PM

## 2020-03-02 ENCOUNTER — Telehealth (HOSPITAL_COMMUNITY): Payer: Self-pay

## 2020-03-02 ENCOUNTER — Encounter (HOSPITAL_COMMUNITY): Payer: Self-pay

## 2020-03-02 NOTE — Telephone Encounter (Signed)
-----   Message from Dolores Patty, MD sent at 03/01/2020  8:35 PM EST ----- Normal

## 2020-03-02 NOTE — Telephone Encounter (Signed)
Samara Snide, RN  03/02/2020 11:58 AM EST Back to Top    Patient aware and verbalized understanding

## 2020-03-31 ENCOUNTER — Encounter (HOSPITAL_COMMUNITY): Payer: Self-pay | Admitting: *Deleted

## 2020-05-22 ENCOUNTER — Other Ambulatory Visit (HOSPITAL_COMMUNITY): Payer: Self-pay | Admitting: Internal Medicine

## 2021-01-19 ENCOUNTER — Other Ambulatory Visit: Payer: Self-pay

## 2021-01-19 ENCOUNTER — Ambulatory Visit (HOSPITAL_COMMUNITY)
Admission: RE | Admit: 2021-01-19 | Discharge: 2021-01-19 | Disposition: A | Discharge status: Home / Self Care | Payer: BC Managed Care – PPO | Source: Ambulatory Visit | Attending: Cardiology | Admitting: Cardiology

## 2021-01-19 ENCOUNTER — Encounter (HOSPITAL_COMMUNITY): Payer: Self-pay

## 2021-01-19 ENCOUNTER — Encounter (HOSPITAL_COMMUNITY): Payer: BC Managed Care – PPO

## 2021-01-19 VITALS — BP 177/110 | HR 51 | Wt 202.2 lb

## 2021-01-19 DIAGNOSIS — I10 Essential (primary) hypertension: Secondary | ICD-10-CM | POA: Diagnosis not present

## 2021-01-19 DIAGNOSIS — I421 Obstructive hypertrophic cardiomyopathy: Secondary | ICD-10-CM | POA: Diagnosis not present

## 2021-01-19 DIAGNOSIS — M549 Dorsalgia, unspecified: Secondary | ICD-10-CM | POA: Diagnosis not present

## 2021-01-19 DIAGNOSIS — Z8616 Personal history of COVID-19: Secondary | ICD-10-CM | POA: Insufficient documentation

## 2021-01-19 DIAGNOSIS — I110 Hypertensive heart disease with heart failure: Secondary | ICD-10-CM | POA: Diagnosis not present

## 2021-01-19 DIAGNOSIS — E785 Hyperlipidemia, unspecified: Secondary | ICD-10-CM | POA: Insufficient documentation

## 2021-01-19 DIAGNOSIS — I6529 Occlusion and stenosis of unspecified carotid artery: Secondary | ICD-10-CM | POA: Diagnosis not present

## 2021-01-19 DIAGNOSIS — I5022 Chronic systolic (congestive) heart failure: Secondary | ICD-10-CM | POA: Insufficient documentation

## 2021-01-19 DIAGNOSIS — I340 Nonrheumatic mitral (valve) insufficiency: Secondary | ICD-10-CM | POA: Insufficient documentation

## 2021-01-19 DIAGNOSIS — Z7982 Long term (current) use of aspirin: Secondary | ICD-10-CM | POA: Insufficient documentation

## 2021-01-19 DIAGNOSIS — Z79899 Other long term (current) drug therapy: Secondary | ICD-10-CM | POA: Insufficient documentation

## 2021-01-19 LAB — BASIC METABOLIC PANEL
Anion gap: 8 (ref 5–15)
BUN: 20 mg/dL (ref 6–20)
CO2: 28 mmol/L (ref 22–32)
Calcium: 8.9 mg/dL (ref 8.9–10.3)
Chloride: 101 mmol/L (ref 98–111)
Creatinine, Ser: 1.18 mg/dL (ref 0.61–1.24)
GFR, Estimated: >60 mL/min (ref >60)
Glucose, Bld: 93 mg/dL (ref 70–99)
Potassium: 4.1 mmol/L (ref 3.5–5.1)
Sodium: 137 mmol/L (ref 135–145)

## 2021-01-19 LAB — BRAIN NATRIURETIC PEPTIDE: B Natriuretic Peptide: 303.6 pg/mL — ABNORMAL HIGH (ref 0.0–100.0)

## 2021-01-19 MED ORDER — HYDROCHLOROTHIAZIDE 50 MG PO TABS
50.0000 mg | ORAL_TABLET | Freq: Every day | ORAL | 3 refills | Status: DC
Start: 2021-01-19 — End: 2021-01-19

## 2021-01-19 MED ORDER — HYDROCHLOROTHIAZIDE 50 MG PO TABS
50.0000 mg | ORAL_TABLET | Freq: Every day | ORAL | 3 refills | Status: DC
Start: 2021-01-19 — End: 2022-02-18

## 2021-01-19 MED ORDER — ATORVASTATIN CALCIUM 40 MG PO TABS: 40.0000 mg | ORAL_TABLET | Freq: Every day | ORAL | 3 refills | Status: DC

## 2021-01-19 NOTE — Progress Notes (Signed)
Patient ID: [name] [name], male   DOB: 08/17/1960, 60 y.o.   MRN: [id number]   Advanced Heart Failure Clinic Note   Date:  01/19/2021   ID:  [name] [name], DOB [birth year], MRN [id number]  PCP:  Arsenio Loader, FNP  HF Cardiologist:  Dr. Glori Bickers     History of Present Illness: [name] [name] is a 60 y.o. male with a hx of HOCM, status post septal myomectomy in 04/2006 at [facility], mild mitral regurgitation, HTN, HL with a low HDL. (cardiac cath 2007 with normal coronaries) He was admitted to [facility] in 3/12 with syncope and atypical chest discomfort. He saw one of our cardiologists there. He had mildly elevated troponins. This was likely related to strain from his LVH. His CK-MB's were negative. Echo (06/2010): moderate to severe LVH and EF of 65%, resting LVOT peak velocity was calculated at 2.21 m/s. A Myoview study demonstrated a small, fixed defect in the apical septal segment. There was a small, reversible defect in the mid anterolateral, apical lateral segments. Findings were similar to a prior study in 05/2009. Mild apical lateral ischemia could not be ruled out and his EF was 53%.   He saw Richardson Dopp in f/u 03/2012.  F/u echo (03/2012):  Severe LVH, EF 80%, LVOT peak velocity 1.33 m/s, septal thickness 21 mm, Gr 2 DD, mild LAE.  He has been doing well since last seen. He denies chest pain, syncope, PND, edema, dyspnea.   Had ETT 04/04/14: 12:00 min [name] Protocol. 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  Seen 11/21 for annual follow up. Had COVID 12/2019. Felt terrible. Echo repeated with on-going dyspnea to r/o myocarditis.  Echo 11/21 EF 65-70%, grade II DD, normal RV, severe LAE.  Today he returns for an acute visit, here with his wife and son. He has been struggling with back pain for the last 6 weeks and noticed BP elevated at home 160-170/90-100s. Started on naproxen for several  weeks by Ortho. He and his wife care for their son at home who is special-needs. No obvious SOB, gets mildly winded when going up and down the stairs at night to use the restroom. Denies CP, dizziness, palpitations, edema, or PND/Orthopnea. Appetite ok. No fever or chills. He does not weigh at home. Taking all medications, but he stopped his statin. Out of work since September due to back pain. No syncope since COVID last year.  Review of systems complete and found to be negative unless listed in HPI.   Recent Labs: No results found for requested labs within last 8760 hours.  Wt Readings from Last 3 Encounters:  01/19/21 91.7 kg (202 lb 3.2 oz)  02/24/20 88.2 kg (194 lb 6 oz)  03/24/19 92.3 kg (203 lb 6.4 oz)    Past Medical History:  Diagnosis Date   Arthritis    "some in my hands" (02/25/2018)   Carotid artery stenosis, asymptomatic, right    Chest pain     -Lexiscan Myoview 3/12: ? small apical lateral ischemia, EF 53%; similar to study in 2/11   CHF (congestive heart failure) (HCC)    hx   Erectile dysfunction    GERD (gastroesophageal reflux disease)    Heart murmur    hx   HOCM (hypertrophic obstructive cardiomyopathy) ([place])     --cardiac cath 2007. nomrmal cors;  s/p myomectomy 2008;  Echo 3/12: mod to severe LVH, HCM,  EF 65%, resting LVOT peak 2.21 m/s, mild LAE, mild MR, AV mean 9;  Echo 12/13: severe LVH, EF 80%, Gr 2 diast dsyfn, mild LAE, mild RVE   HTN (hypertension)    Hyperlipidemia    Current Outpatient Medications  Medication Sig Dispense Refill   ASPIRIN 81 PO Take 81 mg by mouth daily.     atorvastatin (LIPITOR) 40 MG tablet Take 1 tablet (40 mg total) by mouth daily. 90 tablet 3   losartan (COZAAR) 100 MG tablet TAKE 1 TABLET DAILY 90 tablet 3   metoprolol succinate (TOPROL-XL) 100 MG 24 hr tablet TAKE 1 TABLET TWICE A DAY WITH OR IMMEDIATELY FOLLOWING A MEAL (NEED TO SCHEDULE APPT WITH OUR OFFICE AT [phone] FOR FUTURE REFILLS) 180 tablet 3    hydrochlorothiazide (HYDRODIURIL) 50 MG tablet Take 1 tablet (50 mg total) by mouth daily. 90 tablet 3   naproxen (NAPROSYN) 500 MG tablet Take 500 mg by mouth 2 (two) times daily as needed. (Patient not taking: Reported on 01/19/2021)     No current facility-administered medications for this encounter.   Allergies:   Patient has no known allergies.   Social History:  The patient  reports that he has never smoked. He has never used smokeless tobacco. He reports that he does not currently use alcohol. He reports that he does not use drugs.   Family History:  The patient's family history includes Autism in his son; Bladder Cancer in his father; Kidney failure in his mother; Multiple myeloma in his mother; Non-Hodgkin's lymphoma in his father.   Wt Readings from Last 3 Encounters:  01/19/21 91.7 kg (202 lb 3.2 oz)  02/24/20 88.2 kg (194 lb 6 oz)  03/24/19 92.3 kg (203 lb 6.4 oz)   BP (!) 177/110   Pulse (!) 51   Wt 91.7 kg (202 lb 3.2 oz)   SpO2 98%   BMI 35.82 kg/m   PHYSICAL EXAM: General:  NAD. No resp difficulty HEENT: Normal Neck: Supple. Thick neck, JVP appears ~6-7. Carotids 2+ bilat; no bruits. No lymphadenopathy or thryomegaly appreciated. Cor: PMI nondisplaced. Regular rate & rhythm. No rubs, gallops or murmurs. Lungs: Clear Abdomen: Obese, nontender, nondistended. No hepatosplenomegaly. No bruits or masses. Good bowel sounds. Extremities: No cyanosis, clubbing, rash, edema Neuro: Alert & oriented x 3, cranial nerves grossly intact. Moves all 4 extremities w/o difficulty. Affect pleasant.  ECG: Sinus brady 49 bpm with 1st degree AV block (244 ms), LVH with IVCD (124 ms) (Personally reviewed) ReDs: 42%  ASSESSMENT AND PLAN:  1. Hypertension:  - Uncontrolled. ? due to acute back pain, also appears mildly volume up. - Increase HCTZ to 50 mg daily. - I have asked him to check BP/HR at home and log. Notify if he has dizziness or syncope. - Consider renal dopplers for  RAS. - BMET today.  2. Hypertrophic Obstructive Cardiomyopathy s/p septal myomectomy in January 2008 at [place] [facility]. Asymptomatic.  - ETT (12/15) with excellent exercise tolerance. No arrhythmias and good BP response to exercise. He has not had any high-risk features for SCD and does not meet cirteria ICD. - Echo (9/19): No obstruction. No SAM, mild MR, LA moderately dilated. - Echo (11/21): EF 65-70%, grade II DD, normal RV, severe LAE. - Appears to have some fluid today, ReDs 42%, weight up 8 lbs. Activity limited by back pain. - Increase HCTZ as above.  - Will hold off on giving PRN lasix with his HOCM. - Continue Toprol XL 100 mg bid.  HR 51 today, may need to back down to 100 q am/50 q pm. Will follow with BP/HR log. - Continue losartan 100 mg daily. - BMET & BNP today, repeat BMET in 10 days.  3. Carotid stenosis with stroke like symptoms - s/p R CEA in 02/2018. Followed by [facility].  - Continue ASA to 81 mg daily - Restart atorvastatin 40 mg daily.  He should not return to work until BP is better controlled. OK to give work note if needed.  Follow up with Dr. Haroldine Laws as scheduled.  Rafael Bihari, FNP 12:35 PM

## 2021-01-19 NOTE — Progress Notes (Signed)
ReDS Vest / Clip - 01/19/21 1200       ReDS Vest / Clip   Station Marker A    Ruler Value 31    ReDS Value Range High volume overload    ReDS Actual Value 42

## 2021-01-19 NOTE — Patient Instructions (Addendum)
EKG done today.  RedsClip done today.  Labs done today. We will contact you only if your labs are abnormal.  INCREASE Hydrochlorothiazide(HCTZ) to 50mg  (1 tablet) by mouth daily.   RESTART Atorvastatin (Lipitor) 40mg  (1 tablet) by mouth daily.  No other medication changes were made. Please continue all current medications as prescribed.  Your physician has requested that you regularly monitor and record your blood pressure and weight readings at home. Please use the same machine at the same time of day to check your readings and record them to bring to your follow-up visit.  Your physician recommends that you have lab work done in 10 days (a paper prescription was provided to you during your appointment)  Your physician recommends that you schedule a follow-up appointment with Dr.  If you have any questions or concerns before your next appointment please send a message through [place] or call our office at [phone].    TO LEAVE A MESSAGE FOR THE NURSE SELECT OPTION 2, PLEASE LEAVE A MESSAGE INCLUDING: YOUR NAME DATE OF BIRTH CALL BACK NUMBER REASON FOR CALL**this is important as we prioritize the call backs  YOU WILL RECEIVE A CALL BACK THE SAME DAY AS LONG AS YOU CALL BEFORE 4:00 PM   Do the following things EVERYDAY: Weigh yourself in the morning before breakfast. Write it down and keep it in a log. Take your medicines as prescribed Eat low salt foods--Limit salt (sodium) to 2000 mg per day.  Stay as active as you can everyday Limit all fluids for the day to less than 2 liters   At the Advanced [facility] Failure Clinic, you and your health needs are our priority. As part of our continuing mission to provide you with exceptional heart care, we have created designated Provider Care Teams. These Care Teams include your primary Cardiologist (physician) and Advanced Practice Providers (APPs- Physician Assistants and Nurse Practitioners) who all work together to provide  you with the care you need, when you need it.   You may see any of the following providers on your designated Care Team at your next follow up: Dr [place] Dr [phone], NP Arvilla Meres, Carron Curie Robbie Lis, PharmD   Please be sure to bring in all your medications bottles to every appointment.

## 2021-03-06 ENCOUNTER — Ambulatory Visit (HOSPITAL_COMMUNITY)
Admission: RE | Admit: 2021-03-06 | Discharge: 2021-03-06 | Disposition: A | Discharge status: Home / Self Care | Payer: BC Managed Care – PPO | Source: Ambulatory Visit | Attending: Internal Medicine | Admitting: Internal Medicine

## 2021-03-06 ENCOUNTER — Encounter (HOSPITAL_COMMUNITY): Payer: Self-pay | Admitting: Internal Medicine

## 2021-03-06 ENCOUNTER — Other Ambulatory Visit: Payer: Self-pay

## 2021-03-06 VITALS — BP 104/60 | HR 69 | Wt 199.0 lb

## 2021-03-06 DIAGNOSIS — I6529 Occlusion and stenosis of unspecified carotid artery: Secondary | ICD-10-CM

## 2021-03-06 DIAGNOSIS — I421 Obstructive hypertrophic cardiomyopathy: Secondary | ICD-10-CM | POA: Diagnosis not present

## 2021-03-06 DIAGNOSIS — I110 Hypertensive heart disease with heart failure: Secondary | ICD-10-CM | POA: Diagnosis not present

## 2021-03-06 DIAGNOSIS — I509 Heart failure, unspecified: Secondary | ICD-10-CM | POA: Insufficient documentation

## 2021-03-06 DIAGNOSIS — Z79899 Other long term (current) drug therapy: Secondary | ICD-10-CM | POA: Insufficient documentation

## 2021-03-06 DIAGNOSIS — Z7901 Long term (current) use of anticoagulants: Secondary | ICD-10-CM | POA: Insufficient documentation

## 2021-03-06 DIAGNOSIS — M549 Dorsalgia, unspecified: Secondary | ICD-10-CM | POA: Insufficient documentation

## 2021-03-06 DIAGNOSIS — I10 Essential (primary) hypertension: Secondary | ICD-10-CM | POA: Diagnosis not present

## 2021-03-06 NOTE — Patient Instructions (Signed)
EKG done today.  Your physician recommends that you schedule a follow-up appointment in: 1 year. (November 2023). **call office to schedule appointment in September 2023**  If you have any questions or concerns before your next appointment please send [place] a message through [place] or call our office at [phone].    TO LEAVE A MESSAGE FOR THE NURSE SELECT OPTION 2, PLEASE LEAVE A MESSAGE INCLUDING: YOUR NAME DATE OF BIRTH CALL BACK NUMBER REASON FOR CALL**this is important as we prioritize the call backs  YOU WILL RECEIVE A CALL BACK THE SAME DAY AS LONG AS YOU CALL BEFORE 4:00 PM At the [facility], you and your health needs are our priority. As part of our continuing mission to provide you with exceptional heart care, we have created designated Provider Care Teams. These Care Teams include your primary Cardiologist (physician) and Advanced Practice Providers (APPs- Physician Assistants and Nurse Practitioners) who all work together to provide you with the care you need, when you need it.   You may see any of the following providers on your designated Care Team at your next follow up: Dr Arvilla Meres Dr Carron Curie, NP Robbie Lis, [place] [facility], [place] Karle Plumber, PharmD   Please be sure to bring in all your medications bottles to every appointment.

## 2021-03-06 NOTE — Progress Notes (Signed)
Patient ID: [name], male   DOB: [birth year], 60 y.o.   MRN: [id number]   Advanced Heart Failure Clinic Note   Date:  03/06/2021   ID:  [name] [name], DOB 26-Dec-1960, MRN [id number]  PCP:  Arsenio Loader, FNP  Cardiologist:  Dr. Glori Bickers     History of Present Illness:  [name] [name] is a 60 y.o. male with a hx of HOCM, status post septal myomectomy in 04/2006 at [facility], mild mitral regurgitation, HTN, HL with a low HDL. (cardiac cath 2007 with normal coronaries) He was admitted to [facility] in 3/12 with syncope and atypical chest discomfort. He saw one of our cardiologists there. He had mildly elevated troponins. This was likely related to strain from his LVH. His CK-MB's were negative. Echo (06/2010): moderate to severe LVH and EF of 65%, resting LVOT peak velocity was calculated at 2.21 m/s. A Myoview study demonstrated a small, fixed defect in the apical septal segment. There was a small, reversible defect in the mid anterolateral, apical lateral segments. Findings were similar to a prior study in 05/2009. Mild apical lateral ischemia could not be ruled out and his EF was 53%.   He saw Richardson Dopp in f/u 03/2012.  F/u echo (03/2012):  Severe LVH, EF 80%, LVOT peak velocity 1.33 m/s, septal thickness 21 mm, Gr 2 DD, mild LAE.  He has been doing well since last seen. He denies chest pain, syncope, PND, edema, dyspnea.   Had ETT 04/04/14: 12:00 min [name] Protocol. 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  He presents today for annual follow up. A few months ago had back pain and saw Ortho. Treated with epidural injection and muscle relaxant. Seen here 9/22 with HTN and volume overload. HCTZ increased to 50 daily. Which worked well. Now back to work. Doing well. 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 No CP or SOB. No palpitations, syncope or presyncope  BP at home SBP 110-130.  Recent labs with  PCP ok.     Review of systems complete and found to be negative unless listed in HPI.   Recent Labs: 01/19/2021: B Natriuretic Peptide 303.6; Creatinine, Ser 1.18; Potassium 4.1  Wt Readings from Last 3 Encounters:  03/06/21 90.3 kg (199 lb)  01/19/21 91.7 kg (202 lb 3.2 oz)  02/24/20 88.2 kg (194 lb 6 oz)     Past Medical History:  Diagnosis Date   Arthritis    "some in my hands" (02/25/2018)   Carotid artery stenosis, asymptomatic, right    Chest pain     -Lexiscan Myoview 3/12: ? small apical lateral ischemia, EF 53%; similar to study in 2/11   CHF (congestive heart failure) (HCC)    hx   Erectile dysfunction    GERD (gastroesophageal reflux disease)    Heart murmur    hx   HOCM (hypertrophic obstructive cardiomyopathy) ([place])     --cardiac cath 2007. nomrmal cors;  s/p myomectomy 2008;  Echo 3/12: mod to severe LVH, HCM, EF 65%, resting LVOT peak 2.21 m/s, mild LAE, mild MR, AV mean 9;  Echo 12/13: severe LVH, EF 80%, Gr 2 diast dsyfn, mild LAE, mild RVE   HTN (hypertension)    Hyperlipidemia     Current Outpatient Medications  Medication Sig Dispense Refill   ASPIRIN 81 PO Take 81 mg by mouth daily.     atorvastatin (LIPITOR) 40 MG tablet Take 1 tablet (  40 mg total) by mouth daily. 90 tablet 3   hydrochlorothiazide (HYDRODIURIL) 50 MG tablet Take 1 tablet (50 mg total) by mouth daily. 90 tablet 3   losartan (COZAAR) 100 MG tablet TAKE 1 TABLET DAILY 90 tablet 3   metoprolol succinate (TOPROL-XL) 100 MG 24 hr tablet TAKE 1 TABLET TWICE A DAY WITH OR IMMEDIATELY FOLLOWING A MEAL (NEED TO SCHEDULE APPT WITH OUR OFFICE AT [phone] FOR FUTURE REFILLS) 180 tablet 3   No current facility-administered medications for this encounter.    Allergies:   Patient has no known allergies.   Social History:  The patient  reports that he has never smoked. He has never used smokeless tobacco. He reports that he does not currently use alcohol. He reports that he does not use drugs.    Family History:  The patient's family history includes Autism in his son; Bladder Cancer in his father; Kidney failure in his mother; Multiple myeloma in his mother; Non-Hodgkin's lymphoma in his father.     PHYSICAL EXAM: VS:  BP 104/60   Pulse 69   Wt 90.3 kg (199 lb)   SpO2 97%   BMI 35.25 kg/m    Wt Readings from Last 3 Encounters:  03/06/21 90.3 kg (199 lb)  01/19/21 91.7 kg (202 lb 3.2 oz)  02/24/20 88.2 kg (194 lb 6 oz)   General:  Well appearing. No resp difficulty HEENT: normal Neck: supple. no JVD. Carotids 2+ bilat; no bruits. No lymphadenopathy or thryomegaly appreciated. Cor: PMI nondisplaced. Regular rate & rhythm. No rubs, gallops or murmurs. No murmur with valsalva Lungs: clear Abdomen: soft, nontender, nondistended. No hepatosplenomegaly. No bruits or masses. Good bowel sounds. Extremities: no cyanosis, clubbing, rash, edema Neuro: alert & orientedx3, cranial nerves grossly intact. moves all 4 extremities w/o difficulty. Affect pleasant   EKG: NSR 68  1 AVB (276m) LVH Personally reviewed  ASSESSMENT AND PLAN   1. Hypertrophic Obstructive Cardiomyopathy [place] [facility] [facility] [facility]. ETT in 12/15 with excellent exercise tolerance. No arrhythmias and good BP response to exercise. He has not had any high-risk features for SCD and does not meet cirteria ICD. - Echo 9/19: No obstruction. No SAM, mild MR, LA moderately dilated - Echo 11/21 EF 65-70% No SAM or outflow tract gradient  - Continue Toprol XL 100 mg bid and losartan 1075mdaily - Doing well. Overall stable. 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 Hypertension:  - BP well controlled with systolics at home mostly 11400-867- recent labs ok.   4. Carotid stenosis with stroke like symptoms - s/p R CEA in 02/2018. Followed by [facility]  - Continue ASA/statin   DaGlori BickersMD 4:08 PM

## 2021-04-17 ENCOUNTER — Other Ambulatory Visit (HOSPITAL_COMMUNITY): Payer: Self-pay | Admitting: Internal Medicine

## 2021-05-23 ENCOUNTER — Other Ambulatory Visit (HOSPITAL_COMMUNITY): Payer: Self-pay | Admitting: Internal Medicine

## 2021-05-30 ENCOUNTER — Ambulatory Visit: Payer: BC Managed Care – PPO | Admitting: Internal Medicine

## 2021-06-07 NOTE — Progress Notes (Incomplete)
° ° ° °06/07/2021 °[name] [name] °[id number] °07/06/1960 ° ° °ASSESSMENT AND PLAN:  °*** °There are no diagnoses linked to this encounter. ° ° °Future Appointments  °Date Time Provider Department Center  °06/08/2021  3:00 PM ,  R, PA-C [facility]  ° ° °Patient Care Team: °Roberts, Maurice, FNP as PCP - General (Family Medicine) ° °HISTORY OF PRESENT ILLNESS: °60 y.o. male referred by Roberts, Maurice, FNP, with a past medical history of GERD, HOCM follows with Dr. Bensimhon (Echo 03/01/2020 EF 65 to 70%, LVH, grade 2 diastolic) and others listed below presents for evaluation of melena. ° °External labs and notes reviewed this visit: °04/08/2012 screening colonoscopy with Dr. Shifflett  °showed diverticulosis, mid right colon sessile lesion removed piecemeal. ° ° °Current Medications:  ° ° °Current Outpatient Medications (Cardiovascular):  °  atorvastatin (LIPITOR) 40 MG tablet, Take 1 tablet (40 mg total) by mouth daily. °  hydrochlorothiazide (HYDRODIURIL) 50 MG tablet, Take 1 tablet (50 mg total) by mouth daily. °  losartan (COZAAR) 100 MG tablet, TAKE 1 TABLET DAILY °  metoprolol succinate (TOPROL-XL) 100 MG 24 hr tablet, Take 1 tablet (100 mg total) by mouth daily. ° ° °Current Outpatient Medications (Analgesics):  °  ASPIRIN 81 PO, Take 81 mg by mouth daily. ° ° ° °Medical History:  °Past Medical History:  °Diagnosis Date  ° Arthritis   ° "some in my hands" (02/25/2018)  ° Carotid artery stenosis, asymptomatic, right   ° Chest pain   °  -Lexiscan Myoview 3/12: ? small apical lateral ischemia, EF 53%; similar to study in 2/11  ° CHF (congestive heart failure) (HCC)   ° hx  ° Erectile dysfunction   ° GERD (gastroesophageal reflux disease)   ° Heart murmur   ° hx  ° HOCM (hypertrophic obstructive cardiomyopathy) (HCC)   °  --cardiac cath 2007. nomrmal cors;  s/p myomectomy 2008;  Echo 3/12: mod to severe LVH, HCM, EF 65%, resting LVOT peak 2.21 m/s, mild LAE, mild MR, AV mean 9;  Echo 12/13:  severe LVH, EF 80%, Gr 2 diast dsyfn, mild LAE, mild RVE  ° HTN (hypertension)   ° Hyperlipidemia   ° °Allergies: No Known Allergies  ° °Surgical History:  °He  has a past surgical history that includes Myomectomy (04/2006); Carotid endarterectomy (Right, 02/25/2018); Cardiac catheterization (04/14/2006); and Endarterectomy (Right, 02/25/2018). °Family History:  °His family history includes Autism in his son; Bladder Cancer in his father; Kidney failure in his mother; Multiple myeloma in his mother; Non-Hodgkin's lymphoma in his father. °Social History:  ° reports that he has never smoked. He has never used smokeless tobacco. He reports that he does not currently use alcohol. He reports that he does not use drugs. ° °REVIEW OF SYSTEMS  : All other systems reviewed and negative except where noted in the History of Present Illness. ° ° °PHYSICAL EXAM: °There were no vitals taken for this visit. °General:   Pleasant, well developed male in no acute distress °Head:  Normocephalic and atraumatic. °Eyes: {sclerae:26738},conjunctive {conjuctiva:26739}  °Heart:  {HEART EXAM HEM/ONC:21750} °Pulm: Clear anteriorly; no wheezing °Abdomen:  {BlankSingle:19197::"Distended","Ridged","Soft"}, {BlankSingle:19197::"Flat","Obese"} AB, skin exam {ABDOMEN SKIN EXAM:22649}, {BlankSingle:19197::"Absent","Hyperactive, tinkling","Hypoactive","Sluggish","Normal"} bowel sounds. {Desc; pc desc - abdomen tenderness:5168} tenderness {anatomy; site abdomen:5010}. {BlankMultiple:19196::"Without guarding","With guarding","Without rebound","With rebound"}, {Exam; abdomen organomegaly:15152}. °Extremities:  {With/Without:304960234} edema. °Msk:  Symmetrical without gross deformities. Peripheral pulses intact.  °Neurologic:  Alert and  oriented x4;  grossly normal neurologically. °Skin:   Dry and intact without significant lesions or rashes. °  Psychiatric: Demonstrates good judgement and reason without abnormal affect or behaviors. ° ° ° R  , PA-C °9:12 AM ° ° °

## 2021-06-08 ENCOUNTER — Ambulatory Visit: Payer: BC Managed Care – PPO | Admitting: Physician Assistant

## 2021-06-08 DIAGNOSIS — I421 Obstructive hypertrophic cardiomyopathy: Secondary | ICD-10-CM

## 2021-06-08 DIAGNOSIS — K219 Gastro-esophageal reflux disease without esophagitis: Secondary | ICD-10-CM

## 2021-12-21 ENCOUNTER — Other Ambulatory Visit (HOSPITAL_COMMUNITY): Payer: Self-pay | Admitting: *Deleted

## 2021-12-21 MED ORDER — METOPROLOL SUCCINATE ER 100 MG PO TB24: 100.0000 mg | ORAL_TABLET | Freq: Two times a day (BID) | ORAL | 3 refills | Status: DC

## 2021-12-21 MED ORDER — METOPROLOL SUCCINATE ER 100 MG PO TB24: 100.0000 mg | ORAL_TABLET | Freq: Two times a day (BID) | ORAL | 0 refills | Status: DC

## 2021-12-27 ENCOUNTER — Other Ambulatory Visit (HOSPITAL_COMMUNITY): Payer: Self-pay | Admitting: Family Medicine

## 2022-01-21 ENCOUNTER — Other Ambulatory Visit (HOSPITAL_COMMUNITY): Payer: Self-pay | Admitting: Internal Medicine

## 2022-02-17 ENCOUNTER — Other Ambulatory Visit (HOSPITAL_COMMUNITY): Payer: Self-pay | Admitting: Family Medicine

## 2022-03-04 ENCOUNTER — Encounter (HOSPITAL_COMMUNITY): Payer: BC Managed Care – PPO | Admitting: Internal Medicine

## 2022-03-05 ENCOUNTER — Other Ambulatory Visit: Payer: Self-pay

## 2022-03-05 ENCOUNTER — Encounter (HOSPITAL_COMMUNITY): Payer: Self-pay

## 2022-03-05 ENCOUNTER — Inpatient Hospital Stay (HOSPITAL_COMMUNITY)
Admission: EM | Admit: 2022-03-05 | Discharge: 2022-03-07 | DRG: 308 | Disposition: A | Discharge status: Home / Self Care | Payer: BC Managed Care – PPO | Attending: Internal Medicine | Admitting: Internal Medicine

## 2022-03-05 ENCOUNTER — Emergency Department (HOSPITAL_COMMUNITY): Payer: BC Managed Care – PPO

## 2022-03-05 DIAGNOSIS — I483 Typical atrial flutter: Principal | ICD-10-CM | POA: Diagnosis present

## 2022-03-05 DIAGNOSIS — I341 Nonrheumatic mitral (valve) prolapse: Secondary | ICD-10-CM | POA: Diagnosis not present

## 2022-03-05 DIAGNOSIS — Z79899 Other long term (current) drug therapy: Secondary | ICD-10-CM

## 2022-03-05 DIAGNOSIS — I421 Obstructive hypertrophic cardiomyopathy: Secondary | ICD-10-CM | POA: Diagnosis present

## 2022-03-05 DIAGNOSIS — R001 Bradycardia, unspecified: Secondary | ICD-10-CM | POA: Diagnosis present

## 2022-03-05 DIAGNOSIS — I4892 Unspecified atrial flutter: Secondary | ICD-10-CM | POA: Diagnosis present

## 2022-03-05 DIAGNOSIS — Z7982 Long term (current) use of aspirin: Secondary | ICD-10-CM

## 2022-03-05 DIAGNOSIS — E785 Hyperlipidemia, unspecified: Secondary | ICD-10-CM | POA: Diagnosis present

## 2022-03-05 DIAGNOSIS — N179 Acute kidney failure, unspecified: Secondary | ICD-10-CM | POA: Diagnosis not present

## 2022-03-05 DIAGNOSIS — T502X5A Adverse effect of carbonic-anhydrase inhibitors, benzothiadiazides and other diuretics, initial encounter: Secondary | ICD-10-CM | POA: Diagnosis not present

## 2022-03-05 DIAGNOSIS — I5033 Acute on chronic diastolic (congestive) heart failure: Secondary | ICD-10-CM | POA: Diagnosis present

## 2022-03-05 DIAGNOSIS — I5031 Acute diastolic (congestive) heart failure: Secondary | ICD-10-CM

## 2022-03-05 DIAGNOSIS — I509 Heart failure, unspecified: Secondary | ICD-10-CM

## 2022-03-05 DIAGNOSIS — K219 Gastro-esophageal reflux disease without esophagitis: Secondary | ICD-10-CM | POA: Diagnosis present

## 2022-03-05 DIAGNOSIS — I340 Nonrheumatic mitral (valve) insufficiency: Secondary | ICD-10-CM | POA: Diagnosis present

## 2022-03-05 DIAGNOSIS — I110 Hypertensive heart disease with heart failure: Secondary | ICD-10-CM | POA: Diagnosis present

## 2022-03-05 DIAGNOSIS — I4891 Unspecified atrial fibrillation: Principal | ICD-10-CM

## 2022-03-05 LAB — BASIC METABOLIC PANEL
Anion gap: 10 (ref 5–15)
BUN: 20 mg/dL (ref 6–20)
CO2: 27 mmol/L (ref 22–32)
Calcium: 8.8 mg/dL — ABNORMAL LOW (ref 8.9–10.3)
Chloride: 102 mmol/L (ref 98–111)
Creatinine, Ser: 1.14 mg/dL (ref 0.61–1.24)
GFR, Estimated: >60 mL/min (ref >60)
Glucose, Bld: 120 mg/dL — ABNORMAL HIGH (ref 70–99)
Potassium: 4.1 mmol/L (ref 3.5–5.1)
Sodium: 139 mmol/L (ref 135–145)

## 2022-03-05 LAB — FOLATE: Folate: 14.6 ng/mL (ref >5.9)

## 2022-03-05 LAB — TSH: TSH: 1.677 u[IU]/mL (ref 0.350–4.500)

## 2022-03-05 LAB — RETICULOCYTES
Immature Retic Fract: 6 % (ref 2.3–15.9)
RBC.: 4.3 MIL/uL (ref 4.22–5.81)
Retic Count, Absolute: 73.1 10*3/uL (ref 19.0–186.0)
Retic Ct Pct: 1.7 % (ref 0.4–3.1)

## 2022-03-05 LAB — CBC WITH DIFFERENTIAL/PLATELET
Abs Immature Granulocytes: 0.02 10*3/uL (ref 0.00–0.07)
Basophils Absolute: 0 10*3/uL (ref 0.0–0.1)
Basophils Relative: 1 %
Eosinophils Absolute: 0.1 10*3/uL (ref 0.0–0.5)
Eosinophils Relative: 1 %
HCT: 39.5 % (ref 39.0–52.0)
Hemoglobin: 13.6 g/dL (ref 13.0–17.0)
Immature Granulocytes: 0 %
Lymphocytes Relative: 27 %
Lymphs Abs: 1.8 10*3/uL (ref 0.7–4.0)
MCH: 32 pg (ref 26.0–34.0)
MCHC: 34.4 g/dL (ref 30.0–36.0)
MCV: 92.9 fL (ref 80.0–100.0)
Monocytes Absolute: 0.7 10*3/uL (ref 0.1–1.0)
Monocytes Relative: 10 %
Neutro Abs: 4.1 10*3/uL (ref 1.7–7.7)
Neutrophils Relative %: 61 %
Platelets: 202 10*3/uL (ref 150–400)
RBC: 4.25 MIL/uL (ref 4.22–5.81)
RDW: 13.2 % (ref 11.5–15.5)
WBC: 6.7 10*3/uL (ref 4.0–10.5)
nRBC: 0 % (ref 0.0–0.2)

## 2022-03-05 LAB — BRAIN NATRIURETIC PEPTIDE: B Natriuretic Peptide: 773.2 pg/mL — ABNORMAL HIGH (ref 0.0–100.0)

## 2022-03-05 LAB — IRON AND TIBC
Iron: 121 ug/dL (ref 45–182)
Saturation Ratios: 37 % (ref 17.9–39.5)
TIBC: 330 ug/dL (ref 250–450)
UIBC: 209 ug/dL

## 2022-03-05 LAB — FERRITIN: Ferritin: 54 ng/mL (ref 24–336)

## 2022-03-05 LAB — LIPID PANEL
Cholesterol: 119 mg/dL (ref 0–200)
HDL: 27 mg/dL — ABNORMAL LOW (ref >40)
LDL Cholesterol: 70 mg/dL (ref 0–99)
Total CHOL/HDL Ratio: 4.4 RATIO
Triglycerides: 110 mg/dL (ref <150)
VLDL: 22 mg/dL (ref 0–40)

## 2022-03-05 LAB — MAGNESIUM: Magnesium: 1.9 mg/dL (ref 1.7–2.4)

## 2022-03-05 LAB — TROPONIN I (HIGH SENSITIVITY)
Troponin I (High Sensitivity): 34 ng/L — ABNORMAL HIGH (ref <18)
Troponin I (High Sensitivity): 32 ng/L — ABNORMAL HIGH (ref <18)

## 2022-03-05 LAB — VITAMIN B12: Vitamin B-12: 412 pg/mL (ref 180–914)

## 2022-03-05 MED ORDER — ATORVASTATIN CALCIUM 40 MG PO TABS
40.0000 mg | ORAL_TABLET | Freq: Every day | ORAL | Status: DC
  Administered 2022-03-06 – 2022-03-07 (×2): 40 mg via ORAL
  Filled 2022-03-05 (×2): qty 1

## 2022-03-05 MED ORDER — SODIUM CHLORIDE 0.9% FLUSH: 3.0000 mL | INTRAVENOUS | Status: DC | PRN

## 2022-03-05 MED ORDER — APIXABAN 5 MG PO TABS
5.0000 mg | ORAL_TABLET | Freq: Two times a day (BID) | ORAL | Status: DC
  Administered 2022-03-05 – 2022-03-07 (×4): 5 mg via ORAL
  Filled 2022-03-05 (×5): qty 1

## 2022-03-05 MED ORDER — POTASSIUM CHLORIDE CRYS ER 20 MEQ PO TBCR
20.0000 meq | EXTENDED_RELEASE_TABLET | Freq: Once | ORAL | Status: AC
  Administered 2022-03-05: 20 meq via ORAL
  Filled 2022-03-05: qty 1

## 2022-03-05 MED ORDER — ATORVASTATIN CALCIUM 40 MG PO TABS: 40.0000 mg | ORAL_TABLET | Freq: Every day | ORAL | Status: DC

## 2022-03-05 MED ORDER — FUROSEMIDE 10 MG/ML IJ SOLN
40.0000 mg | Freq: Two times a day (BID) | INTRAMUSCULAR | Status: AC
  Administered 2022-03-05 – 2022-03-06 (×2): 40 mg via INTRAVENOUS
  Filled 2022-03-05 (×2): qty 4

## 2022-03-05 MED ORDER — APIXABAN 5 MG PO TABS
5.0000 mg | ORAL_TABLET | Freq: Once | ORAL | Status: AC
  Administered 2022-03-05: 5 mg via ORAL
  Filled 2022-03-05: qty 1

## 2022-03-05 MED ORDER — ACETAMINOPHEN 325 MG PO TABS: 650.0000 mg | ORAL_TABLET | ORAL | Status: DC | PRN

## 2022-03-05 MED ORDER — ONDANSETRON HCL 4 MG/2ML IJ SOLN: 4.0000 mg | Freq: Four times a day (QID) | INTRAMUSCULAR | Status: DC | PRN

## 2022-03-05 MED ORDER — LOSARTAN POTASSIUM 50 MG PO TABS
100.0000 mg | ORAL_TABLET | Freq: Every day | ORAL | Status: DC
  Administered 2022-03-06 – 2022-03-07 (×2): 100 mg via ORAL
  Filled 2022-03-05 (×2): qty 2

## 2022-03-05 MED ORDER — SODIUM CHLORIDE 0.9% FLUSH
3.0000 mL | Freq: Two times a day (BID) | INTRAVENOUS | Status: DC
  Administered 2022-03-06 – 2022-03-07 (×3): 3 mL via INTRAVENOUS

## 2022-03-05 MED ORDER — SODIUM CHLORIDE 0.9 % IV SOLN: 250.0000 mL | INTRAVENOUS | Status: DC | PRN

## 2022-03-05 NOTE — ED Provider Notes (Signed)
[facility] Provider Note   CSN: [id number] Arrival date & time: 03/05/22  [id number]     History  Chief Complaint  Patient presents with   Shortness of Breath    [name] [name] is a 61 y.o. male.  Pt is a 61y/o male with hx of HOCM, status post septal myomectomy in 04/2006 at [facility], mild mitral regurgitation, HTN, HL with last echo with an EF of greater than 65% without evidence of outflow tract gradient who is presenting today with a 1 month history of gradual worsening dyspnea on exertion, fatigue, dizziness with activity.  Patient reports that he was out of his hydrochlorothiazide for approximately 1 week but started that again 2 weeks ago and has been taking it regularly.  He has noticed at work he does not does not have the same stamina and is also noticed when he has been checking his pulse ox blood pressure and heart rate at home his pulse has been a lot lower than usual.  He typically takes 200 mg of metoprolol in the morning which she has been taking for the last 15 years but typically his heart rates would be in the 70s.  He has not had cough or congestion, fever, abdominal pain but his wife did notice he looked a little puffy a week or 2 ago.  Patient reports that last night he went to bed at 8 and then woke up around midnight feeling short of breath which is the first time that is happened.  This morning he was not feeling any better and he decided he should come to the emergency room.  He did take his 200 mg of metoprolol this morning as usual.  He does not take any blood thinners.  He denies any chest pain today but reports about 2 weeks ago he did have some pain in his chest.  The history is provided by the patient, the spouse and medical records.  Shortness of Breath      Home Medications Prior to Admission medications   Medication Sig Start Date End Date Taking? Authorizing Provider  ASPIRIN 81 PO Take 81 mg by mouth daily.     [provider]  atorvastatin (LIPITOR) 40 MG tablet TAKE 1 TABLET DAILY 12/27/21   [place], [place], FNP  hydrochlorothiazide (HYDRODIURIL) 50 MG tablet TAKE 1 TABLET DAILY (CHANGE IN DOSAGE OR PILL SIZE) 02/18/22   Bensimhon, Shaune Pascal, MD  losartan (COZAAR) 100 MG tablet TAKE 1 TABLET DAILY 05/23/21   Bensimhon, Shaune Pascal, MD  metoprolol succinate (TOPROL-XL) 100 MG 24 hr tablet Take 1 tablet (100 mg total) by mouth in the morning and at bedtime. NEEDS FOLLOW UP APPOINTMENT FOR ANYMORE REFILLS 01/21/22   Bensimhon, Shaune Pascal, MD      Allergies    Patient has no known allergies.    Review of Systems   Review of Systems  Respiratory:  Positive for shortness of breath.     Physical Exam Updated Vital Signs BP (!) 166/97   Pulse (!) 51   Temp (!) 97.5 F (36.4 C) (Oral)   Resp 19   Ht 5\' 3"  (1.6 m)   Wt 86.2 kg   SpO2 98%   BMI 33.66 kg/m  Physical Exam Vitals and nursing note reviewed.  Constitutional:      General: He is not in acute distress.    Appearance: He is well-developed.  HENT:     Head: Normocephalic and atraumatic.  Eyes:  Conjunctiva/sclera: Conjunctivae normal.     Pupils: Pupils are equal, round, and reactive to light.  Cardiovascular:     Rate and Rhythm: Bradycardia present. Rhythm irregular.     Heart sounds: No murmur heard. Pulmonary:     Effort: Pulmonary effort is normal. No respiratory distress.     Breath sounds: Normal breath sounds. No wheezing or rales.  Abdominal:     General: There is no distension.     Palpations: Abdomen is soft.     Tenderness: There is no abdominal tenderness. There is no guarding or rebound.  Musculoskeletal:        General: No tenderness. Normal range of motion.     Cervical back: Normal range of motion and neck supple.     Comments: Trace edema bilateral lower extremities  Skin:    General: Skin is warm and dry.     Findings: No erythema or rash.  Neurological:     Mental Status: He is alert and oriented  to person, place, and time.  Psychiatric:        Behavior: Behavior normal.     ED Results / Procedures / Treatments   Labs (all labs ordered are listed, but only abnormal results are displayed) Labs Reviewed  BRAIN NATRIURETIC PEPTIDE - Abnormal; Notable for the following components:      Result Value   B Natriuretic Peptide 773.2 (*)    All other components within normal limits  BASIC METABOLIC PANEL - Abnormal; Notable for the following components:   Glucose, Bld 120 (*)    Calcium 8.8 (*)    All other components within normal limits  CBC WITH DIFFERENTIAL/PLATELET  MAGNESIUM  TSH  TROPONIN I (HIGH SENSITIVITY)  TROPONIN I (HIGH SENSITIVITY)    EKG EKG Interpretation  Date/Time:  Tuesday March 05 2022 05:42:17 EST Ventricular Rate:  41 PR Interval:    QRS Duration: 120 QT Interval:  558 QTC Calculation: 460 R Axis:   29 Text Interpretation: new Atrial flutter with variable A-V block Incomplete left bundle branch block Minimal voltage criteria for LVH, may be normal variant ( Cornell product ) Abnormal ECG When compared with ECG of 05-Mar-2022 05:40, PREVIOUS ECG IS PRESENT Confirmed by Blanchie Dessert [phone]) on 03/05/2022 7:48:38 AM  Radiology DG Chest 2 View  Result Date: 03/05/2022 CLINICAL DATA:  61 year old male with history of shortness of breath. EXAM: CHEST - 2 VIEW COMPARISON:  Chest x-ray 01/26/2020. FINDINGS: Lung volumes are normal. No consolidative airspace disease. No pleural effusions. No pneumothorax. No pulmonary nodule or mass noted. Pulmonary vasculature and the cardiomediastinal silhouette are within normal limits. Atherosclerosis in the thoracic aorta. Status post median sternotomy. Abandoned epicardial pacing wires are incidentally noted. IMPRESSION: 1. No radiographic evidence of acute cardiopulmonary disease. 2. Aortic atherosclerosis. Electronically Signed   By: Vinnie Langton M.D.   On: 03/05/2022 06:27    Procedures Procedures     Medications Ordered in ED Medications  apixaban (ELIQUIS) tablet 5 mg (5 mg Oral Given 03/05/22 0820)    ED Course/ Medical Decision Making/ A&P                           Medical Decision Making Amount and/or Complexity of Data Reviewed Independent Historian: spouse External Data Reviewed: notes.    Details: cardiology Labs: ordered. Decision-making details documented in ED Course. Radiology: ordered and independent interpretation performed. Decision-making details documented in ED Course. ECG/medicine tests: ordered and independent interpretation performed. Decision-making  details documented in ED Course.  Risk Prescription drug management. Decision regarding hospitalization.   Pt with multiple medical problems and comorbidities and presenting today with a complaint that caries a high risk for morbidity and mortality.  Here today with dizziness, dyspnea on exertion and bradycardia a flutter with bradycardia heart rates between 38-50 while in the room.  CBC and BMP are within normal limits, BNP is elevated at 770 5 AM levels are normal.  Patient denies any acute infectious issues and suspect his symptoms are related to new onset atrial fibrillation and bradycardia causing CHF.  Patient does take hydrochlorothiazide but is not on other diuretics at this time.  I have independently visualized and interpreted pt's images today.  Chest x-ray without acute findings.  We will have cardiology consult on the patient.  He was given a dose of Eliquis.  Most likely will need to decrease his metoprolol dosing.  TSH was ordered.  Findings were discussed with the patient and his wife.  They are comfortable with this plan.  He is not a candidate for cardioversion at this time as he is anticoagulated and may have been in atrial fibrillation for the last month.  8:25 AM Spoke with cards who will see and admit the patient.  CHA2DS2-VASc Score =   2  This indicates a 2.2% annual risk of stroke. The  patient's score is based upon: point for prior CHF and HTN                Final Clinical Impression(s) / ED Diagnoses Final diagnoses:  Atrial fibrillation and flutter (HCC)  Bradycardia  Acute congestive heart failure, unspecified heart failure type [facility])    Rx / DC Orders ED Discharge Orders     None         Gwyneth Sprout, MD 03/05/22 0825

## 2022-03-05 NOTE — Consult Note (Addendum)
Advanced Heart Failure Team History and Physical Note   PCP:  Arsenio Loader, FNP  PCP-Cardiology: None     Reason for Admission: shortness of breath   HPI:   [name] [name] is seen today for evaluation of Shortness of breath at the request of Dr. Maryan Rued, emergency medicine.   [name] [name] is a 61 y.o. male with a hx of HOCM, s/p septal myomectomy in 04/2006 at [facility], mild mitral regurgitation, HTN, HL with a low HDL. Was admitted to [facility] in 3/12 with syncope and atypical chest discomfort. He had mildly elevated troponins. This was likely related to strain from his LVH.    Seen in AHF clinic 9/22 with HTN and volume overload. HCTZ increased to 50 daily.   [name] [name] presented with 1 month of DOE, dizziness, and increased fatigue. 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 Over the last month he gets fatigued at work and dizzy with activity. Has DOE, only SOB experienced was last night as he was awakened from sleep around midnight. Took his meds around 2am this morning and came to the ED. He did run out of his HCTZ around 10/28, missed for about a week, has restarted since. Since noticing his fatigue and DOE he's been checking his SPO2 and HR, SPO2 normal and HR has been in the 40s/50s for about 2-3 weeks now, prior was in the high 60s. Continued to take his metoprolol 100 mg BID. Compliant with all medications. Denies smoking/ ETOH. Had DOT physical recently and his BP was elevated to 190s, doesn't always check at home.   In the ED EKG showed atrial flutter at 41 bpm, BNP 770, HsTrop 34>32, CXR no acute findings. Given a dose of Eliquis.   Wife at bedside. Denies CP or SOB at rest.   Cardiac studies reviewed:  11/21 Echo 65-70%, mod LVH. 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 8/19: Echo EF 60-65%, No evidence of SAM or obstructive  hypertrophic DCM  ETT 04/04/14: 12:00 min [name] Protocol. 13.1 METS Peak HR 141 BP 159/84 -> 203/100 Echo 12/13 Severe LVH, EF 80%, LVOT peak velocity 1.33 m/s, septal thickness 21 mm, Gr 2 DD, mild LAE. Echo 3/12: moderate to severe LVH and EF of 65%, resting LVOT peak velocity was calculated at 2.21 m/s.  A Myoview study demonstrated a small, fixed defect in the apical septal segment. There was a small, reversible defect in the mid anterolateral, apical lateral segments. Findings were similar to a prior study in 05/2009. Mild apical lateral ischemia could not be ruled out and his EF was 53%.  LHC 07' with normal coronaries  Home Medications Prior to Admission medications   Medication Sig Start Date End Date Taking? Authorizing Provider  ASPIRIN 81 PO Take 81 mg by mouth daily.   Yes [provider]  atorvastatin (LIPITOR) 40 MG tablet TAKE 1 TABLET DAILY 12/27/21  Yes Milford, Holstein, FNP  hydrochlorothiazide (HYDRODIURIL) 50 MG tablet TAKE 1 TABLET DAILY (CHANGE IN DOSAGE OR PILL SIZE) Patient taking differently: Take 50 mg by mouth daily. 02/18/22  Yes Bensimhon, Shaune Pascal, MD  ibuprofen (ADVIL) 200 MG tablet Take 400-600 mg by mouth daily as needed for moderate pain.   Yes [provider]  losartan (COZAAR) 100 MG tablet TAKE 1 TABLET DAILY Patient taking differently: Take 100 mg by  mouth daily. 05/23/21  Yes Bensimhon, Shaune Pascal, MD  metoprolol succinate (TOPROL-XL) 100 MG 24 hr tablet Take 1 tablet (100 mg total) by mouth in the morning and at bedtime. NEEDS FOLLOW UP APPOINTMENT FOR ANYMORE REFILLS Patient taking differently: Take 100 mg by mouth in the morning and at bedtime. 01/21/22  Yes Bensimhon, Shaune Pascal, MD    Past Medical History: Past Medical History:  Diagnosis Date   Arthritis    "some in my hands" (02/25/2018)   Carotid artery stenosis, asymptomatic, right    Chest pain     -Lexiscan Myoview 3/12: ? small apical lateral ischemia, EF 53%; similar to study in  2/11   CHF (congestive heart failure) (HCC)    hx   Erectile dysfunction    GERD (gastroesophageal reflux disease)    Heart murmur    hx   HOCM (hypertrophic obstructive cardiomyopathy) ([place])     --cardiac cath 2007. nomrmal cors;  s/p myomectomy 2008;  Echo 3/12: mod to severe LVH, HCM, EF 65%, resting LVOT peak 2.21 m/s, mild LAE, mild MR, AV mean 9;  Echo 12/13: severe LVH, EF 80%, Gr 2 diast dsyfn, mild LAE, mild RVE   HTN (hypertension)    Hyperlipidemia     Past Surgical History: Past Surgical History:  Procedure Laterality Date   CARDIAC CATHETERIZATION  04/14/2006   CAROTID ENDARTERECTOMY Right 02/25/2018   ENDARTERECTOMY Right 02/25/2018   Procedure: ENDARTERECTOMY CAROTID RIGHT;  Surgeon: Rosetta Posner, MD;  Location: [facility] OR;  Service: Vascular;  Laterality: Right;   MYOMECTOMY  04/2006   at [facility]     Family History:  Family History  Problem Relation Age of Onset   Non-Hodgkin's lymphoma Father    Bladder Cancer Father    Multiple myeloma Mother    Kidney failure Mother    Autism Son     Social History: Social History   Socioeconomic History   Marital status: Married    Spouse name: Alice   Number of children: 5   Years of education: Not on file   Highest education level: 12th grade  Occupational History   Occupation: route Diplomatic Services operational officer: [employer]  Tobacco Use   Smoking status: Never   Smokeless tobacco: Never  Vaping Use   Vaping Use: Never used  Substance and Sexual Activity   Alcohol use: Not Currently   Drug use: Never   Sexual activity: Yes  Other Topics Concern   Not on file  Social History Narrative   Patient is right-handed. He lives with his wife in a 2 story house. He drinks 2 cups of coffee a day, and until 2 weeks ago, he drank several Mt. Dew's a day. He is very active at work.   Social Determinants of Health   Financial Resource Strain: Not on file  Food Insecurity: Not on file  Transportation Needs: Not on file  Physical  Activity: Not on file  Stress: Not on file  Social Connections: Not on file    Allergies:  No Known Allergies  Objective:    Vital Signs:   Temp:  [97.5 F (36.4 C)-97.7 F (36.5 C)] 97.7 F (36.5 C) (11/14 0852) Pulse Rate:  [45-55] 55 (11/14 1045) Resp:  [12-19] 18 (11/14 1045) BP: (149-166)/(90-116) 161/103 (11/14 1045) SpO2:  [97 %-99 %] 97 % (11/14 1045) Weight:  [86.2 kg] 86.2 kg (11/14 0558)   Filed Weights   03/05/22 0558  Weight: 86.2 kg    Physical Exam  General:  well appearing.  No respiratory difficulty HEENT: normal Neck: supple. JVD ~14 cm. Carotids 2+ bilat; no bruits. No lymphadenopathy or thyromegaly appreciated. Cor: PMI nondisplaced. Irregular rate & rhythm. No rubs, gallops or murmurs. Lungs: clear Abdomen: soft, nontender, nondistended. No hepatosplenomegaly. No bruits or masses. Good bowel sounds. Extremities: no cyanosis, clubbing, rash, edema  Neuro: alert & oriented x 3, cranial nerves grossly intact. moves all 4 extremities w/o difficulty. Affect pleasant.   Telemetry   Atrial flutter with slow ventricular rate 40s (Personally reviewed)    EKG   Atrial flutter with variable AV block, incomplete LBBB 41 bpm  Labs     Basic Metabolic Panel: Recent Labs  Lab 03/05/22 0620  NA 139  K 4.1  CL 102  CO2 27  GLUCOSE 120*  BUN 20  CREATININE 1.14  CALCIUM 8.8*  MG 1.9    Liver Function Tests: No results for input(s): "AST", "ALT", "ALKPHOS", "BILITOT", "PROT", "ALBUMIN" in the last 168 hours. No results for input(s): "LIPASE", "AMYLASE" in the last 168 hours. No results for input(s): "AMMONIA" in the last 168 hours.  CBC: Recent Labs  Lab 03/05/22 0620  WBC 6.7  NEUTROABS 4.1  HGB 13.6  HCT 39.5  MCV 92.9  PLT 202    Cardiac Enzymes: No results for input(s): "CKTOTAL", "CKMB", "CKMBINDEX", "TROPONINI" in the last 168 hours.  BNP: BNP (last 3 results) Recent Labs    03/05/22 0620  BNP 773.2*    ProBNP (last  3 results) No results for input(s): "PROBNP" in the last 8760 hours.   CBG: No results for input(s): "GLUCAP" in the last 168 hours.  Coagulation Studies: No results for input(s): "LABPROT", "INR" in the last 72 hours.  Imaging: DG Chest 2 View  Result Date: 03/05/2022 CLINICAL DATA:  61 year old male with history of shortness of breath. EXAM: CHEST - 2 VIEW COMPARISON:  Chest x-ray 01/26/2020. FINDINGS: Lung volumes are normal. No consolidative airspace disease. No pleural effusions. No pneumothorax. No pulmonary nodule or mass noted. Pulmonary vasculature and the cardiomediastinal silhouette are within normal limits. Atherosclerosis in the thoracic aorta. Status post median sternotomy. Abandoned epicardial pacing wires are incidentally noted. IMPRESSION: 1. No radiographic evidence of acute cardiopulmonary disease. 2. Aortic atherosclerosis. Electronically Signed   By: Vinnie Langton M.D.   On: 03/05/2022 06:27     Patient Profile   [name] [name] is a 61 y.o. male with PMH of HOCM, s/p septal myomectomy in 04/2006 at [facility], mild mitral regurgitation, HTN, HL with a low HDL. AHF team asked to see for SOB w/ history of HOCM.   Assessment/Plan   1. Shortness of breath, fatigue, DOE - Brief episode of SOB yesterday, resolved. - DOE, dizzy, and fatigued for about 1 month now, 2/2 flutter vs uncontrolled hypertension - will check anemia panel 2. Hypertrophic Obstructive Cardiomyopathy s/p septal myomectomy in January 2008 at [facility] - ETT in 12/15 with excellent exercise tolerance. No arrhythmias and good BP response to exercise. He has not had any high-risk features for SCD and does not meet cirteria ICD. - Echo 9/19: No obstruction. No SAM, mild MR, LA moderately dilated - Echo 11/21 EF 65-70% No SAM or outflow tract gradient  - Volume appears ok on exam, will give lasix 65m IV BID - Will stop 100 BID metoprolol for now with slow rhythm - Continue ASA - Stop hydrochlorothiazide 568m daily for now - Start spironolactone 25 mg daily - Echo ordered - Strict I&O, daily weights 3. New  Atrial flutter, bradycardia - EKG in ED w/ Atrial flutter with variable AV block, incomplete LBBB 41 bpm - Plan for TEE/DCCV tomorrow - Start eliquis 4. Hypertension  - uncontrolled, elevated on exam - Continue losartan 100 mg daily - Start spironolactone 25 for some afterload reduction 5. Carotid stenosis with stroke like symptoms - s/p R CEA in 02/2018. Followed by [facility]  - Continue ASA/statin 6. Hyperlipidemia - lipid panel pending, goal LDL <55 - Continue statin   Earnie Larsson, AGACNP-BC  03/05/2022, 11:11 AM  Advanced Heart Failure Team Pager [phone] (M-F; 7a - 5p)  Please contact [facility] for night-coverage after hours (4p -7a ) and weekends on [email or web address]  Patient seen with NP, agree with the above note.   History as described above.  He was doing well up until about a month ago with minimal symptoms.  Starting 1 month ago, he began to note fatigue and dyspnea with moderate exertion. HR has been slower (40s-50s at rest) for the last month.  Also occasional lightheaded spells, no syncope. Last night, he had an episode of PND and came to the ER.   In the ER, he was noted to have slow typical atrial flutter with rate in 40s-50s. BP was elevated.  He has been off HCTZ.  Last took Toprol XL earlier this morning (takes 100 mg bid).   General: NAD Neck: JVP 12-14 cm, no thyromegaly or thyroid nodule.  Lungs: Clear to auscultation bilaterally with normal respiratory effort. CV: Nondisplaced PMI.  Heart bradycardic, regular S1/S2, no S3/S4, no murmur.  No peripheral edema.   Abdomen: Soft, nontender, no hepatosplenomegaly, no distention.  Skin: Intact without lesions or rashes.  Neurologic: Alert and oriented x 3.  Psych: Normal affect. Extremities: No clubbing or cyanosis.  HEENT: Normal.   1. Atrial flutter: This appears typical.  Based on symptoms, possibly present for  about a month.  HR is in 40s-50s, his baseline HR is in the 60s.  He has been taking Toprol XL 100 mg bid given history of hypertrophic cardiomyopathy.  I suspect AFL has triggered CHF exacerbation.  - Start apixaban today (has had a dose already).  - Stop Toprol XL for now with bradycardia.  - Arrange for TEE-guided DCCV tomorrow after 3rd dose of Eliquis.  - Long-term, would benefit from atrial flutter ablation.  2. Acute on chronic diastolic CHF: Last echo in 11/21 with EF 65-70%, moderate LVH, no LVOT gradient, normal MV with no SAM or MR.  On exam today, he is volume overloaded, likely due to loss of NSR.  - Lasix 40 mg IV bid, follow response. Keep diuresis gentle with HCM (though last echo did not show LVOT obstruction).  - Add spironolactone 25 mg daily.  3. HTN: BP significantly elevated.  He has been out of HCTZ.  - Would continue losartan, has tolerated this at home even with HCM.  - Adding spironolactone 25 mg daily.  - Careful with afterload-reducing agents with HCM.  4. Hypertrophic cardiomyopathy: s/p septal myectomy in 1/08 at [facility].  Last echo in 11/21 showed EF 65-70%, no LVOT gradient, no SAM or significant MR.  - Repeat echo this admission.  - Toprol XL will remain on hold for now with bradycardia.  5. H/o CEA: Continue statin.  No ASA with Eliquis use.   Loralie Champagne 03/05/2022 11:24 AM

## 2022-03-05 NOTE — ED Notes (Signed)
Called to bedside by pt's wife. Pt c/o L sided chest tightness. No SOB, nausea, diaphoresis noted. Pt vitals stable. Repeat EKG obtained. Dr. Anitra Lauth notified.

## 2022-03-05 NOTE — ED Provider Triage Note (Signed)
Emergency Medicine Provider Triage Evaluation Note  [name] [name] , a 61 y.o. male  was evaluated in triage.  Pt complains of SOB x 2 weeks. Had preceding pain in his posterior R shoulder which spontaneously improved. However, SOB has persisted and is worse with exertion. He has been experiencing lightheadedness with position change. No syncope. Denies fever, chest pain, leg swelling.  Hx of HOCM, status post septal myomectomy in 04/2006 at [facility], mild mitral regurgitation, HTN, HL with a low HDL   Review of Systems  Positive: As above Negative: As above  Physical Exam  There were no vitals taken for this visit. Gen:   Awake, no distress   Resp:  Normal effort  MSK:   Moves extremities without difficulty. Ambulatory back to exam room. Other:  Bradycardia. No lower extremity edema.  Medical Decision Making  Medically screening exam initiated at 5:32 AM.  Appropriate orders placed.  Hendricks Limes was informed that the remainder of the evaluation will be completed by another provider, this initial triage assessment does not replace that evaluation, and the importance of remaining in the ED until their evaluation is complete.  SOB x 2 weeks - EKG with bradycardic rate with atrial flutter. Will discuss expedited rooming with charge RN.   Antony Madura, PA-C 03/05/22 [phone]

## 2022-03-05 NOTE — Progress Notes (Signed)
Heart Failure Navigator Progress Note  Assessed for Heart & Vascular TOC clinic readiness.  Patient does not meet criteria due to Advanced heart Failure team patient of Dr. Gala Romney.     Rhae Hammock, BSN, Scientist, clinical (histocompatibility and immunogenetics) Only

## 2022-03-05 NOTE — ED Notes (Signed)
Dr Gala Romney contacted regarding patient's HR. Patient is asymptomatic, reports no changes in SOB or lightheadedness or dizziness.

## 2022-03-05 NOTE — ED Triage Notes (Addendum)
Pt woke up short of breath this am. Pt has been having slow HR and palpitations for past few weeks. Pt states he has noticed increasing dizziness and shortness of breath.  Pt ambulatory to triage, labored breathing. Pt c/o left flank pain.

## 2022-03-05 NOTE — ED Notes (Signed)
Patient alert and oriented, requested to go to restroom. RN removed monitoring devices and observed patient ambulate to restroom with no assistance required and no distress.

## 2022-03-06 ENCOUNTER — Encounter (HOSPITAL_COMMUNITY): Payer: Self-pay | Admitting: Cardiology

## 2022-03-06 ENCOUNTER — Inpatient Hospital Stay (HOSPITAL_COMMUNITY): Payer: BC Managed Care – PPO

## 2022-03-06 ENCOUNTER — Encounter (HOSPITAL_COMMUNITY)
Admission: EM | Disposition: A | Discharge status: Home / Self Care | Payer: Self-pay | Source: Home / Self Care | Attending: Internal Medicine

## 2022-03-06 ENCOUNTER — Inpatient Hospital Stay (HOSPITAL_COMMUNITY): Payer: BC Managed Care – PPO | Admitting: Certified Registered Nurse Anesthetist

## 2022-03-06 DIAGNOSIS — I4892 Unspecified atrial flutter: Secondary | ICD-10-CM

## 2022-03-06 DIAGNOSIS — I341 Nonrheumatic mitral (valve) prolapse: Secondary | ICD-10-CM

## 2022-03-06 DIAGNOSIS — I5033 Acute on chronic diastolic (congestive) heart failure: Secondary | ICD-10-CM | POA: Diagnosis not present

## 2022-03-06 HISTORY — PX: TEE WITHOUT CARDIOVERSION: SHX5443

## 2022-03-06 HISTORY — PX: CARDIOVERSION: SHX1299

## 2022-03-06 LAB — CBC
HCT: 41.2 % (ref 39.0–52.0)
HCT: 42.1 % (ref 39.0–52.0)
Hemoglobin: 14.4 g/dL (ref 13.0–17.0)
Hemoglobin: 15 g/dL (ref 13.0–17.0)
MCH: 32 pg (ref 26.0–34.0)
MCH: 32.8 pg (ref 26.0–34.0)
MCHC: 35 g/dL (ref 30.0–36.0)
MCHC: 35.6 g/dL (ref 30.0–36.0)
MCV: 91.6 fL (ref 80.0–100.0)
MCV: 91.9 fL (ref 80.0–100.0)
Platelets: 197 10*3/uL (ref 150–400)
Platelets: 192 10*3/uL (ref 150–400)
RBC: 4.5 MIL/uL (ref 4.22–5.81)
RBC: 4.58 MIL/uL (ref 4.22–5.81)
RDW: 13.2 % (ref 11.5–15.5)
RDW: 13.2 % (ref 11.5–15.5)
WBC: 6.2 10*3/uL (ref 4.0–10.5)
WBC: 6.8 10*3/uL (ref 4.0–10.5)
nRBC: 0 % (ref 0.0–0.2)
nRBC: 0 % (ref 0.0–0.2)

## 2022-03-06 LAB — ECHOCARDIOGRAM COMPLETE
Height: 63 in
Weight: 2961.6 oz

## 2022-03-06 LAB — BASIC METABOLIC PANEL
Anion gap: 9 (ref 5–15)
BUN: 37 mg/dL — ABNORMAL HIGH (ref 6–20)
CO2: 26 mmol/L (ref 22–32)
Calcium: 9 mg/dL (ref 8.9–10.3)
Chloride: 104 mmol/L (ref 98–111)
Creatinine, Ser: 1.26 mg/dL — ABNORMAL HIGH (ref 0.61–1.24)
GFR, Estimated: >60 mL/min (ref >60)
Glucose, Bld: 114 mg/dL — ABNORMAL HIGH (ref 70–99)
Potassium: 4.3 mmol/L (ref 3.5–5.1)
Sodium: 139 mmol/L (ref 135–145)

## 2022-03-06 LAB — HIV ANTIBODY (ROUTINE TESTING W REFLEX): HIV Screen 4th Generation wRfx: NONREACTIVE

## 2022-03-06 LAB — ECHO TEE
AV Peak grad: 5.2 mmHg
Ao pk vel: 1.14 m/s

## 2022-03-06 LAB — PROTIME-INR
INR: 1.2 (ref 0.8–1.2)
Prothrombin Time: 14.9 seconds (ref 11.4–15.2)

## 2022-03-06 SURGERY — ECHOCARDIOGRAM, TRANSESOPHAGEAL
Anesthesia: Monitor Anesthesia Care

## 2022-03-06 MED ORDER — SPIRONOLACTONE 25 MG PO TABS
25.0000 mg | ORAL_TABLET | Freq: Every day | ORAL | Status: DC
  Administered 2022-03-06 – 2022-03-07 (×2): 25 mg via ORAL
  Filled 2022-03-06 (×2): qty 1

## 2022-03-06 MED ORDER — LIDOCAINE 2% (20 MG/ML) 5 ML SYRINGE
INTRAMUSCULAR | Status: DC | PRN
  Administered 2022-03-06: 40 mg via INTRAVENOUS

## 2022-03-06 MED ORDER — PHENYLEPHRINE 80 MCG/ML (10ML) SYRINGE FOR IV PUSH (FOR BLOOD PRESSURE SUPPORT)
PREFILLED_SYRINGE | INTRAVENOUS | Status: DC | PRN
  Administered 2022-03-06 (×3): 80 ug via INTRAVENOUS
  Administered 2022-03-06: 160 ug via INTRAVENOUS
  Administered 2022-03-06 (×2): 80 ug via INTRAVENOUS

## 2022-03-06 MED ORDER — PROPOFOL 500 MG/50ML IV EMUL
INTRAVENOUS | Status: DC | PRN
  Administered 2022-03-06: 100 ug/kg/min via INTRAVENOUS

## 2022-03-06 MED ORDER — PROPOFOL 10 MG/ML IV BOLUS
INTRAVENOUS | Status: DC | PRN
  Administered 2022-03-06: 25 mg via INTRAVENOUS

## 2022-03-06 MED ORDER — SODIUM CHLORIDE 0.9 % IV SOLN: INTRAVENOUS | Status: DC

## 2022-03-06 NOTE — Anesthesia Procedure Notes (Signed)
Procedure Name: MAC Date/Time: 03/06/2022 12:38 PM  Performed by: Colin Benton, CRNAPre-anesthesia Checklist: Patient identified, Emergency Drugs available, Suction available and Patient being monitored Patient Re-evaluated:Patient Re-evaluated prior to induction Oxygen Delivery Method: Nasal cannula Induction Type: IV induction Airway Equipment and Method: Bite block Placement Confirmation: positive ETCO2 Dental Injury: Teeth and Oropharynx as per pre-operative assessment

## 2022-03-06 NOTE — CV Procedure (Signed)
   TRANSESOPHAGEAL ECHOCARDIOGRAM GUIDED DIRECT CURRENT CARDIOVERSION  NAME:  [name] [name]   MRN: [id number] DOB:  07-Dec-1960   ADMIT DATE: 03/05/2022  INDICATIONS:  AFIB  PROCEDURE:   Informed consent was obtained prior to the procedure. The risks, benefits and alternatives for the procedure were discussed and the patient comprehended these risks.  Risks include, but are not limited to, cough, sore throat, vomiting, nausea, somnolence, esophageal and stomach trauma or perforation, bleeding, low blood pressure, aspiration, pneumonia, infection, trauma to the teeth and death.    After a procedural time-out, the oropharynx was anesthetized and the patient was sedated by the anesthesia service. The transesophageal probe was inserted in the esophagus and stomach without difficulty and multiple views were obtained.   FINDINGS:  LEFT VENTRICLE: EF = 60-65% severe LVH no LVOT gradient  RIGHT VENTRICLE: Normal  LEFT ATRIUM: Moderately dilated + smoke   LEFT ATRIAL APPENDAGE: No clot  RIGHT ATRIUM: Normal  AORTIC VALVE:  Trileaflet.  No AI/AS  MITRAL VALVE:    Mild prolapse trivial MR  TRICUSPID VALVE: Normal trivial TR  PULMONIC VALVE: Normal  INTERATRIAL SEPTUM: Normal  PERICARDIUM: No effusion  DESCENDING AORTA: Mild plaque   CARDIOVERSION:     Indications:  Atrial Fibrillation  Procedure Details:  Once the TEE was complete, the patient had the defibrillator pads placed in the anterior and posterior position. Once an appropriate level of sedation was achieved, the patient received a single biphasic, synchronized 200J shock with prompt conversion to sinus rhythm. No apparent complications.   Arvilla Meres, MD  1:41 PM

## 2022-03-06 NOTE — Interval H&P Note (Signed)
History and Physical Interval Note:  03/06/2022 11:18 AM  [name] [name]  has presented today for surgery, with the diagnosis of aflutter.  The various methods of treatment have been discussed with the patient and family. After consideration of risks, benefits and other options for treatment, the patient has consented to  Procedure(s): TRANSESOPHAGEAL ECHOCARDIOGRAM (TEE) (N/A) CARDIOVERSION (N/A) as a surgical intervention.  The patient's history has been reviewed, patient examined, no change in status, stable for surgery.  I have reviewed the patient's chart and labs.  Questions were answered to the patient's satisfaction.      

## 2022-03-06 NOTE — Transfer of Care (Signed)
Immediate Anesthesia Transfer of Care Note  Patient: [name] [name]  Procedure(s) Performed: TRANSESOPHAGEAL ECHOCARDIOGRAM (TEE) CARDIOVERSION  Patient Location: Endoscopy Unit  Anesthesia Type:MAC  Level of Consciousness: drowsy  Airway & Oxygen Therapy: Patient Spontanous Breathing and Patient connected to nasal cannula oxygen  Post-op Assessment: Report given to RN and Post -op Vital signs reviewed and stable  Post vital signs: Reviewed and stable  Last Vitals:  Vitals Value Taken Time  BP 88/62 03/06/22 1306  Temp 36.2 C 03/06/22 1306  Pulse 58 03/06/22 1308  Resp 12 03/06/22 1308  SpO2 96 % 03/06/22 1308  Vitals shown include unvalidated device data.  Last Pain:  Vitals:   03/06/22 1306  TempSrc: Temporal  PainSc:          Complications: No notable events documented.

## 2022-03-06 NOTE — ED Notes (Signed)
ED TO INPATIENT HANDOFF REPORT  ED Nurse Name and Phone #:   S Name/Age/Gender [name] [name] 61 y.o. male Room/Bed: 035C/035C  Code Status   Code Status: Full Code  Home/SNF/Other Home Patient oriented to: self, place, time, and situation Is this baseline? Yes   Triage Complete: Triage complete  Chief Complaint Atrial flutter [facility]) [I48.92]  Triage Note Pt woke up short of breath this am. Pt has been having slow HR and palpitations for past few weeks. Pt states he has noticed increasing dizziness and shortness of breath.  Pt ambulatory to triage, labored breathing. Pt c/o left flank pain.    Allergies No Known Allergies  Level of Care/Admitting Diagnosis ED Disposition     ED Disposition  Admit   Condition  --   Comment  Hospital Area: [facility] [[id number]]  Level of Care: Progressive [102]  Admit to Progressive based on following criteria: CARDIOVASCULAR & THORACIC of moderate stability with acute coronary syndrome symptoms/low risk myocardial infarction/hypertensive urgency/arrhythmias/heart failure potentially compromising stability and stable post cardiovascular intervention patients.  May admit patient to [facility] or [facility] if equivalent level of care is available:: No  Covid Evaluation: Asymptomatic - no recent exposure (last 10 days) testing not required  Diagnosis: Atrial flutter ([place]) [427.32.ICD-9-CM]  Admitting Physician: Carlyle Dolly  Attending Physician: Jolaine Artist [id number]  Certification:: I certify this patient will need inpatient services for at least 2 midnights  Estimated Length of Stay: 2          B Medical/Surgery History Past Medical History:  Diagnosis Date   Arthritis    "some in my hands" (02/25/2018)   Carotid artery stenosis, asymptomatic, right    Chest pain     -Lexiscan Myoview 3/12: ? small apical lateral ischemia, EF 53%; similar to study in 2/11   CHF (congestive heart failure)  (HCC)    hx   Erectile dysfunction    GERD (gastroesophageal reflux disease)    Heart murmur    hx   HOCM (hypertrophic obstructive cardiomyopathy) ([place])     --cardiac cath 2007. nomrmal cors;  s/p myomectomy 2008;  Echo 3/12: mod to severe LVH, HCM, EF 65%, resting LVOT peak 2.21 m/s, mild LAE, mild MR, AV mean 9;  Echo 12/13: severe LVH, EF 80%, Gr 2 diast dsyfn, mild LAE, mild RVE   HTN (hypertension)    Hyperlipidemia    Past Surgical History:  Procedure Laterality Date   CARDIAC CATHETERIZATION  04/14/2006   CAROTID ENDARTERECTOMY Right 02/25/2018   ENDARTERECTOMY Right 02/25/2018   Procedure: ENDARTERECTOMY CAROTID RIGHT;  Surgeon: Rosetta Posner, MD;  Location: [facility];  Service: Vascular;  Laterality: Right;   MYOMECTOMY  04/2006   at [facility]      A IV Location/Drains/Wounds Patient Lines/Drains/Airways Status     Active Line/Drains/Airways     Name Placement date Placement time Site Days   Peripheral IV 03/05/22 20 G 1" Right Antecubital 03/05/22  0800  Antecubital  1   Incision (Closed) 02/25/18 Neck Right 02/25/18  0909  -- 1470            Intake/Output Last 24 hours No intake or output data in the 24 hours ending 03/06/22 [id number]  Labs/Imaging Results for orders placed or performed during the hospital encounter of 03/05/22 (from the past 48 hour(s))  Troponin I (High Sensitivity)     Status: Abnormal   Collection Time: 03/05/22  6:20 AM  Result Value Ref Range  Troponin I (High Sensitivity) 34 (H) <18 ng/L    Comment: (NOTE) Elevated high sensitivity troponin I (hsTnI) values and significant  changes across serial measurements may suggest ACS but many other  chronic and acute conditions are known to elevate hsTnI results.  Refer to the "Links" section for chest pain algorithms and additional  guidance. Performed at [facility], [address] [place], [place] [address]   Brain natriuretic peptide     Status: Abnormal   Collection Time: 03/05/22  6:20  AM  Result Value Ref Range   B Natriuretic Peptide 773.2 (H) 0.0 - 100.0 pg/mL    Comment: Performed at [facility], [address], [place], [place] [address]  CBC with Differential     Status: None   Collection Time: 03/05/22  6:20 AM  Result Value Ref Range   WBC 6.7 4.0 - 10.5 K/uL   RBC 4.25 4.22 - 5.81 MIL/uL   Hemoglobin 13.6 13.0 - 17.0 g/dL   HCT 62.3 76.2 - 83.1 %   MCV 92.9 80.0 - 100.0 fL   MCH 32.0 26.0 - 34.0 pg   MCHC 34.4 30.0 - 36.0 g/dL   RDW 51.7 61.6 - 07.3 %   Platelets 202 150 - 400 K/uL   nRBC 0.0 0.0 - 0.2 %   Neutrophils Relative % 61 %   Neutro Abs 4.1 1.7 - 7.7 K/uL   Lymphocytes Relative 27 %   Lymphs Abs 1.8 0.7 - 4.0 K/uL   Monocytes Relative 10 %   Monocytes Absolute 0.7 0.1 - 1.0 K/uL   Eosinophils Relative 1 %   Eosinophils Absolute 0.1 0.0 - 0.5 K/uL   Basophils Relative 1 %   Basophils Absolute 0.0 0.0 - 0.1 K/uL   Immature Granulocytes 0 %   Abs Immature Granulocytes 0.02 0.00 - 0.07 K/uL    Comment: Performed at [facility], [address], [place], [place] [address]  Basic metabolic panel     Status: Abnormal   Collection Time: 03/05/22  6:20 AM  Result Value Ref Range   Sodium 139 135 - 145 mmol/L   Potassium 4.1 3.5 - 5.1 mmol/L   Chloride 102 98 - 111 mmol/L   CO2 27 22 - 32 mmol/L   Glucose, Bld 120 (H) 70 - 99 mg/dL    Comment: Glucose reference range applies only to samples taken after fasting for at least 8 hours.   BUN 20 6 - 20 mg/dL   Creatinine, Ser 6.94 0.61 - 1.24 mg/dL   Calcium 8.8 (L) 8.9 - 10.3 mg/dL   GFR, Estimated >85 >46 mL/min    Comment: (NOTE) Calculated using the CKD-EPI Creatinine Equation (2021)    Anion gap 10 5 - 15    Comment: Performed at [facility], [address] [place], [place] [address]  Magnesium     Status: None   Collection Time: 03/05/22  6:20 AM  Result Value Ref Range   Magnesium 1.9 1.7 - 2.4 mg/dL    Comment: Performed at [facility], [address], [place], [place] [address]  Troponin I (High Sensitivity)     Status: Abnormal   Collection Time: 03/05/22  8:01 AM  Result Value Ref Range   Troponin I (High Sensitivity) 32 (H) <18 ng/L    Comment: (NOTE) Elevated high sensitivity troponin I (hsTnI) values and significant  changes across serial measurements may suggest ACS but many other  chronic and  acute conditions are known to elevate hsTnI results.  Refer to the "Links" section for chest pain algorithms and additional  guidance. Performed at [facility], [address], [place], [place] [address]   TSH     Status: None   Collection Time: 03/05/22  8:01 AM  Result Value Ref Range   TSH 1.677 0.350 - 4.500 uIU/mL    Comment: Performed by a 3rd Generation assay with a functional sensitivity of <=0.01 uIU/mL. Performed at [facility], [address], [place], [place] [address]   Lipid panel     Status: Abnormal   Collection Time: 03/05/22  3:16 PM  Result Value Ref Range   Cholesterol 119 0 - 200 mg/dL   Triglycerides 496 <759 mg/dL   HDL 27 (L) >16 mg/dL   Total CHOL/HDL Ratio 4.4 RATIO   VLDL 22 0 - 40 mg/dL   LDL Cholesterol 70 0 - 99 mg/dL    Comment:        Total Cholesterol/HDL:CHD Risk Coronary Heart Disease Risk Table                     Men   Women  1/2 Average Risk   3.4   3.3  Average Risk       5.0   4.4  2 X Average Risk   9.6   7.1  3 X Average Risk  23.4   11.0        Use the calculated Patient Ratio above and the CHD Risk Table to determine the patient's CHD Risk.        ATP III CLASSIFICATION (LDL):  <100     mg/dL   Optimal  384-665  mg/dL   Near or Above                    Optimal  130-159  mg/dL   Borderline  993-570  mg/dL   High  >177     mg/dL   Very High Performed at [facility], [address], [place], [place] [address]   Vitamin B12     Status: None   Collection Time: 03/05/22  3:16 PM  Result Value Ref Range   Vitamin B-12 412 180 - 914 pg/mL    Comment:  (NOTE) This assay is not validated for testing neonatal or myeloproliferative syndrome specimens for Vitamin B12 levels. Performed at [facility], [address] [place], [place] [address]   Iron and TIBC     Status: None   Collection Time: 03/05/22  3:16 PM  Result Value Ref Range   Iron 121 45 - 182 ug/dL   TIBC 300 762 - 263 ug/dL   Saturation Ratios 37 17.9 - 39.5 %   UIBC 209 ug/dL    Comment: Performed at [facility], [address], [place], [place] [address]  Ferritin     Status: None   Collection Time: 03/05/22  3:16 PM  Result Value Ref Range   Ferritin 54 24 - 336 ng/mL    Comment: Performed at [facility], [address] [place], [place] [address]  Reticulocytes     Status: None   Collection Time: 03/05/22  3:16 PM  Result Value Ref Range   Retic Ct Pct 1.7 0.4 - 3.1 %   RBC. 4.30 4.22 - 5.81 MIL/uL   Retic Count, Absolute 73.1 19.0 - 186.0 K/uL   Immature Retic Fract  6.0 2.3 - 15.9 %    Comment: Performed at [facility], [place] [address] [place], [place] [address]  Folate     Status: None   Collection Time: 03/05/22  3:16 PM  Result Value Ref Range   Folate 14.6 >5.9 ng/mL    Comment: Performed at [facility], [place] [address], [place] [address]  CBC     Status: None   Collection Time: 03/06/22  4:00 AM  Result Value Ref Range   WBC 6.2 4.0 - 10.5 K/uL   RBC 4.50 4.22 - 5.81 MIL/uL   Hemoglobin 14.4 13.0 - 17.0 g/dL   HCT 41.2 39.0 - 52.0 %   MCV 91.6 80.0 - 100.0 fL   MCH 32.0 26.0 - 34.0 pg   MCHC 35.0 30.0 - 36.0 g/dL   RDW 13.2 11.5 - 15.5 %   Platelets 197 150 - 400 K/uL   nRBC 0.0 0.0 - 0.2 %    Comment: Performed at [facility], [place] [address] [place], [place] [address]   DG Chest 2 View  Result Date: 03/05/2022 CLINICAL DATA:  61 year old male with history of shortness of breath. EXAM: CHEST - 2 VIEW COMPARISON:  Chest x-ray 01/26/2020. FINDINGS: Lung volumes are normal. No consolidative airspace  disease. No pleural effusions. No pneumothorax. No pulmonary nodule or mass noted. Pulmonary vasculature and the cardiomediastinal silhouette are within normal limits. Atherosclerosis in the thoracic aorta. Status post median sternotomy. Abandoned epicardial pacing wires are incidentally noted. IMPRESSION: 1. No radiographic evidence of acute cardiopulmonary disease. 2. Aortic atherosclerosis. Electronically Signed   By: Vinnie Langton M.D.   On: 03/05/2022 06:27    Pending Labs Unresulted Labs (From admission, onward)     Start     Ordered   03/07/22 [id number]  Basic metabolic panel  Daily at 5am,   R     Comments: As Scheduled for 5 days    03/06/22 0824   03/06/22 0825  Protime-INR  ONCE - STAT,   STAT        03/06/22 0824   03/06/22 [id number]  Basic metabolic panel  Once,   R        03/06/22 0825   03/06/22 0825  CBC  Once,   R        03/06/22 0825   03/06/22 0500  CBC  Daily at 5am,   R      03/05/22 1120   03/05/22 1636  HIV Antibody (routine testing w rflx)  (HIV Antibody (Routine testing w reflex) panel)  Once,   R        03/05/22 1635            Vitals/Pain Today's Vitals   03/06/22 0600 03/06/22 0645 03/06/22 0730 03/06/22 0800  BP: 118/84 131/88 108/83   Pulse: (!) 58 (!) 35 (!) 38   Resp: 10  12   Temp:      TempSrc:      SpO2: 95% 98% 95%   Weight:      Height:      PainSc:    0-No pain    Isolation Precautions No active isolations  Medications Medications  apixaban (ELIQUIS) tablet 5 mg (5 mg Oral Given 03/05/22 1945)  losartan (COZAAR) tablet 100 mg (has no administration in time range)  sodium chloride flush (NS) 0.9 % injection 3 mL (0 mLs Intravenous Hold 03/05/22 2310)  sodium chloride flush (NS) 0.9 % injection 3 mL (has no  administration in time range)  0.9 %  sodium chloride infusion (has no administration in time range)  acetaminophen (TYLENOL) tablet 650 mg (has no administration in time range)  ondansetron (ZOFRAN) injection 4 mg (has no  administration in time range)  furosemide (LASIX) injection 40 mg (40 mg Intravenous Given 03/05/22 1715)  0.9 %  sodium chloride infusion (has no administration in time range)  atorvastatin (LIPITOR) tablet 40 mg (has no administration in time range)  spironolactone (ALDACTONE) tablet 25 mg (has no administration in time range)  apixaban (ELIQUIS) tablet 5 mg (5 mg Oral Given 03/05/22 0820)  potassium chloride SA (KLOR-CON M) CR tablet 20 mEq (20 mEq Oral Given 03/05/22 1716)    Mobility walks Low fall risk   Focused Assessments    R Recommendations: See Admitting Provider Note  Report given to:   Additional Notes:

## 2022-03-06 NOTE — Progress Notes (Addendum)
Advanced Heart Failure Rounding Note  PCP-Cardiologist: None   Subjective:    Remains in flutter, NPO for TEE/DCCV later today  Still in ED wife at bedside, denies CP or SOB.   Objective:   Weight Range: 86.2 kg Body mass index is 33.66 kg/m.   Vital Signs:   Temp:  [97.7 F (36.5 C)-98.1 F (36.7 C)] 98.1 F (36.7 C) (11/15 0427) Pulse Rate:  [35-144] 38 (11/15 0730) Resp:  [10-19] 12 (11/15 0730) BP: (101-167)/(72-103) 108/83 (11/15 0730) SpO2:  [90 %-99 %] 95 % (11/15 0730) Last BM Date : 03/05/22  Weight change: Filed Weights   03/05/22 0558  Weight: 86.2 kg    Intake/Output:  No intake or output data in the 24 hours ending 03/06/22 0819    Physical Exam    General:  well appearing.  No respiratory difficulty HEENT: normal Neck: supple. JVD ~9. Carotids 2+ bilat; no bruits. No lymphadenopathy or thyromegaly appreciated. Cor: PMI nondisplaced. Irregular rate & rhythm. No rubs, gallops or murmurs. Lungs: clear Abdomen: soft, nontender, nondistended. No hepatosplenomegaly. No bruits or masses. Good bowel sounds. Extremities: no cyanosis, clubbing, rash, edema  Neuro: alert & oriented x 3, cranial nerves grossly intact. moves all 4 extremities w/o difficulty. Affect pleasant.   Telemetry   A flutter 60s (Personally reviewed)    EKG    Atrial flutter 50 bpm  Labs    CBC Recent Labs    03/05/22 0620 03/06/22 0400  WBC 6.7 6.2  NEUTROABS 4.1  --   HGB 13.6 14.4  HCT 39.5 41.2  MCV 92.9 91.6  PLT 202 197   Basic Metabolic Panel Recent Labs    79/15/05 0620  NA 139  K 4.1  CL 102  CO2 27  GLUCOSE 120*  BUN 20  CREATININE 1.14  CALCIUM 8.8*  MG 1.9   Liver Function Tests No results for input(s): "AST", "ALT", "ALKPHOS", "BILITOT", "PROT", "ALBUMIN" in the last 72 hours. No results for input(s): "LIPASE", "AMYLASE" in the last 72 hours. Cardiac Enzymes No results for input(s): "CKTOTAL", "CKMB", "CKMBINDEX", "TROPONINI" in the  last 72 hours.  BNP: BNP (last 3 results) Recent Labs    03/05/22 0620  BNP 773.2*    ProBNP (last 3 results) No results for input(s): "PROBNP" in the last 8760 hours.   D-Dimer No results for input(s): "DDIMER" in the last 72 hours. Hemoglobin A1C No results for input(s): "HGBA1C" in the last 72 hours. Fasting Lipid Panel Recent Labs    03/05/22 1516  CHOL 119  HDL 27*  LDLCALC 70  TRIG 697  CHOLHDL 4.4   Thyroid Function Tests Recent Labs    03/05/22 0801  TSH 1.677    Other results:   Imaging    No results found.   Medications:     Scheduled Medications:  apixaban  5 mg Oral BID   atorvastatin  40 mg Oral Daily   furosemide  40 mg Intravenous BID   losartan  100 mg Oral Daily   sodium chloride flush  3 mL Intravenous Q12H    Infusions:  sodium chloride      PRN Medications: sodium chloride, acetaminophen, ondansetron (ZOFRAN) IV, sodium chloride flush    Patient Profile   [name] [name] is a 61 y.o. male with PMH of HOCM, s/p septal myomectomy in 04/2006 at [facility], mild mitral regurgitation, HTN, HL with a low HDL. AHF team asked to see for SOB w/ history of HOCM.    Assessment/Plan  1. Atrial flutter: This appears typical. Based on symptoms, possibly present for about a month.  HR was in 40s-50s on admission, his baseline HR is in the 60s.  He has been taking Toprol XL 100 mg bid given history of hypertrophic cardiomyopathy. I suspect AFL has triggered CHF exacerbation.  - Continue apixaban, third dose this am - Stop Toprol XL for now with bradycardia.  - TEE-guided DCCV today after 3rd dose of Eliquis.  - Long-term, would benefit from atrial flutter ablation.   2. Acute on chronic diastolic CHF:  - Last echo in 62/83 with EF 65-70%, moderate LVH, no LVOT gradient, normal MV with no SAM or MR.   - On admission he was volume overloaded, likely due to loss of NSR.  - Got Lasix 40 mg IV bid yesterday, one more dose this am. Good UOP per  patient, nothing recorded. Keep diuresis gentle with HCM (though last echo did not show LVOT obstruction).  - echo still pending - Continue spironolactone 25 mg daily.   3. HTN:  - BP significantly elevated on admission, had been out of HCTZ.  - Would continue losartan, has tolerated this at home even with HCM.  - Continue above regimen, BP stable this am - Careful with afterload-reducing agents with HCM.   4. Hypertrophic cardiomyopathy - s/p septal myectomy in 1/08 at [facility].  Last echo in 11/21 showed EF 65-70%, no LVOT gradient, no SAM or significant MR.  - Repeat echo this admission.  - Toprol XL will remain on hold for now with bradycardia.   5. H/o CEA - Continue statin.  No ASA with Eliquis use.   6. Hyperlipidemia - LDL 70, goal LDL <55 - HDL 27 - Continue statin  Length of Stay: 1  [name] [name], AGACNP-BC  03/06/2022, 8:19 AM  Advanced Heart Failure Team Pager [phone] (M-F; 7a - 5p)  Please contact [facility] for night-coverage after hours (5p -7a ) and weekends on [email or web address]  Patient seen and examined with the above-signed Advanced Practice Provider and/or Housestaff. I personally reviewed laboratory data, imaging studies and relevant notes. I independently examined the patient and formulated the important aspects of the plan. I have edited the note to reflect any of my changes or salient points. I have personally discussed the plan with the patient and/or family.  Feels ok. No CP or SOB. Remains in AFL. Now on apixaban. Denies CP or SOB.   General:  Well appearing. No resp difficulty HEENT: normal Neck: supple. no JVD. Carotids 2+ bilat; no bruits. No lymphadenopathy or thryomegaly appreciated. Cor: PMI nondisplaced. Irregular rate & rhythm. No rubs, gallops or murmurs. Lungs: clear Abdomen: soft, nontender, nondistended. No hepatosplenomegaly. No bruits or masses. Good bowel sounds. Extremities: no cyanosis, clubbing, rash, edema Neuro: alert & orientedx3,  cranial nerves grossly intact. moves all 4 extremities w/o difficulty. Affect pleasant  Plan TEE/DC-CV later today. Continue Eliquis, Repeat echo pending.   Arvilla Meres, MD  11:05 AM

## 2022-03-06 NOTE — Progress Notes (Signed)
  Transition of Care [facility]) Screening Note   Patient Details  Name: [name] [name] Date of Birth: May 26, 1960   Transition of Care [facility]) CM/SW Contact:    Delilah Shan, LCSWA Phone Number: 03/06/2022, 1:53 PM    Transition of Care Department [facility]) has reviewed patient and no TOC needs have been identified at this time. We will continue to monitor patient advancement through interdisciplinary progression rounds. If new patient transition needs arise, please place a TOC consult.

## 2022-03-06 NOTE — Anesthesia Preprocedure Evaluation (Signed)
Anesthesia Evaluation  Patient identified by MRN, date of birth, ID band Patient awake    Reviewed: Allergy & Precautions, H&P , NPO status , Patient's Chart, lab work & pertinent test results  Airway Mallampati: II   Neck ROM: full    Dental   Pulmonary neg pulmonary ROS   breath sounds clear to auscultation       Cardiovascular hypertension, +CHF  + Valvular Problems/Murmurs  Rhythm:regular Rate:Normal     Neuro/Psych    GI/Hepatic ,GERD  ,,  Endo/Other    Renal/GU      Musculoskeletal  (+) Arthritis ,    Abdominal   Peds  Hematology   Anesthesia Other Findings   Reproductive/Obstetrics                             Anesthesia Physical Anesthesia Plan  ASA: 3  Anesthesia Plan: MAC   Post-op Pain Management:    Induction: Intravenous  PONV Risk Score and Plan: 1 and Propofol infusion and Treatment may vary due to age or medical condition  Airway Management Planned: Nasal Cannula  Additional Equipment:   Intra-op Plan:   Post-operative Plan:   Informed Consent: I have reviewed the patients History and Physical, chart, labs and discussed the procedure including the risks, benefits and alternatives for the proposed anesthesia with the patient or authorized representative who has indicated his/her understanding and acceptance.     Dental advisory given  Plan Discussed with: CRNA, Anesthesiologist and Surgeon  Anesthesia Plan Comments:        Anesthesia Quick Evaluation

## 2022-03-06 NOTE — Discharge Summary (Addendum)
Advanced Heart Failure Team  Discharge Summary   Patient ID: [name] [name] MRN: [id number], DOB/AGE: 1960/12/13 61 y.o. Admit date: 03/05/2022 D/C date:     03/07/2022   Primary Discharge Diagnoses:  Atrial flutter, unspecified  Acute on chronic diastolic CHF Hypertrophic cardiomyopathy HTN  Secondary Discharge Diagnoses:  H/o CEA Hyperlipidemia  Hospital Course: [name] [name] is a 61 y.o. male with PMH of HOCM, s/p septal myomectomy in 04/2006 at [facility], HTN, HL, carotid artery stenosis s/p R CEA.  Admitted with new atrial flutter with slow ventricular response on 03/05/22. Underwent successful TEE/DCCV 11/15 with conversion to NSR. TEE with EF 60-65%, severe LVH, no LVOT gradient. He had volume overload, likely d/t loss of SR. Diuresed with IV lasix and developed mild AKI. IV diuretic stopped. Home hydrochlorothiazide held, to be restarted at decreased dose 2 days after discharge. Metoprolol had been held d/t bradycardia but added back (at lower dose) d/t HR 80s-90s day of discharge.  Maintaining SR day of discharge.   Meds sent to [place].  Has close f/u in HF clinic next week. Will need repeat BMET.  Please see below for hospital course by problem.    Hospital Course by Problem: 1. Atrial flutter:  -This appears typical. Based on symptoms, had been present for about a month.  HR was in 40s-50s on admission, his baseline HR is in the 60s.  He has been taking Toprol XL 100 mg bid given history of hypertrophic cardiomyopathy. Suspect AFL has triggered CHF exacerbation.  - S/p TEE/DCCV to SR on 11/15. Maintaining SR today - Toprol XL stopped on admit d/t bradycardia, HR 80s-90s today. Added back Toprol XL at reduced dose, 25 BID. - Continue apixaban 5 BID - Long-term, would benefit from atrial flutter ablation.    2. Acute on chronic diastolic CHF:  - Echo in [id number] with EF 65-70%, moderate LVH, no LVOT gradient, normal MV with no SAM or MR.   - TEE this admit: EF 60-65%,  severe LVH, no LVOT gradient, trivial MR - On admission he was volume overloaded, likely due to loss of NSR.  - Diuresed with IV lasix. Scr bumped today, 1.2>1.6. Now off diuretics. - Restart home hydrochlorothiazide but at lower dose (25 mg daily) in 2 days - Continue spironolactone 25 mg daily.    3. HTN:  - BP significantly elevated on admission, had been out of HCTZ but then recently restarted.  - Continue Losartan 100 mg daily, has tolerated this at home even with HCM.  - Add back HCTZ in 2 days and metoprolol xl as above - Continue spiro - Careful with afterload-reducing agents with HCM.  - BP controlled today   4. Hypertrophic cardiomyopathy - s/p septal myectomy in 1/08 at [facility].   - echo in 11/21 showed EF 65-70%, no LVOT gradient, no SAM or significant MR.  - TEE this admit EF 60-65%, no LVOT gradient - Add back Toprol XL. HR now 80s-90s.   5. H/o CEA - S/p R CEA 02/2018 - Followed by [facility] - Continue statin.  No ASA with Eliquis use.    6. Hyperlipidemia - LDL 70, goal LDL <55 - Continue statin  Discharge Vitals: Blood pressure 102/85, pulse 92, temperature 97.6 F (36.4 C), temperature source Oral, resp. rate 16, height 5\' 3"  (1.6 m), weight 85.4 kg, SpO2 96 %.  Labs: Lab Results  Component Value Date   WBC 6.6 03/07/2022   HGB 14.8 03/07/2022   HCT 43.7 03/07/2022   MCV 92.6  03/07/2022   PLT 211 03/07/2022    Recent Labs  Lab 03/07/22 0216  NA 138  K 3.6  CL 101  CO2 27  BUN 48*  CREATININE 1.61*  CALCIUM 9.0  GLUCOSE 134*   Lab Results  Component Value Date   CHOL 119 03/05/2022   HDL 27 (L) 03/05/2022   LDLCALC 70 03/05/2022   TRIG 110 03/05/2022   BNP (last 3 results) Recent Labs    03/05/22 0620  BNP 773.2*    ProBNP (last 3 results) No results for input(s): "PROBNP" in the last 8760 hours.   Diagnostic Studies/Procedures   ECHO TEE  Result Date: 03/06/2022    TRANSESOPHOGEAL ECHO REPORT   Patient Name:   [name] [name]  Date of Exam: 03/06/2022 Medical Rec #:  [id number]          Height:       63.0 in Accession #:    [id number]         Weight:       185.1 lb Date of Birth:  1961-01-29          BSA:          1.871 m Patient Age:    61 years           BP:           129/86 mmHg Patient Gender: M                  HR:           84 bpm. Exam Location:  Inpatient Procedure: Transesophageal Echo, Cardiac Doppler and Color Doppler Indications:     Aflutter  History:         Patient has prior history of Echocardiogram examinations, most                  recent 03/01/2020. CHF and Cardiomyopathy; Risk                  Factors:Hypertension and Dyslipidemia. S/p septal myectomy in                  1/08 at [facility].  Sonographer:     [name] [name] RDCS Referring Phys:  [id number] [name] [name] Diagnosing Phys: [name] Bickers MD PROCEDURE: After discussion of the risks and benefits of a TEE, an informed consent was obtained from the patient. TEE procedure time was 7 minutes. The transesophogeal probe was passed without difficulty through the esophogus of the patient. Imaged were  obtained with the patient in a left lateral decubitus position. Sedation performed by different physician. The patient was monitored while under deep sedation. Anesthestetic sedation was provided intravenously by Anesthesiology: 209.8mg  of Propofol, 40mg  of Lidocaine. Image quality was good. The patient's vital signs; including heart rate, blood pressure, and oxygen saturation; remained stable throughout the procedure. The patient developed no complications during the procedure.  IMPRESSIONS  1. Left ventricular ejection fraction, by estimation, is 60 to 65%. The left ventricle has normal function. There is severe left ventricular hypertrophy.  2. Right ventricular systolic function is normal. The right ventricular size is normal.  3. Left atrial size was moderately dilated. No left atrial/left atrial appendage thrombus was detected.  4. The mitral valve is normal in structure.  Trivial mitral valve regurgitation. There is mild prolapse of both leaflets of the mitral valve.  5. No LVOT gradient. The aortic valve is normal in structure. Aortic valve regurgitation is not visualized. Aortic valve Vmax  measures 1.14 m/s. FINDINGS  Left Ventricle: Left ventricular ejection fraction, by estimation, is 60 to 65%. The left ventricle has normal function. The left ventricular internal cavity size was normal in size. There is severe left ventricular hypertrophy. Right Ventricle: The right ventricular size is normal. No increase in right ventricular wall thickness. Right ventricular systolic function is normal. Left Atrium: Left atrial size was moderately dilated. No left atrial/left atrial appendage thrombus was detected. Right Atrium: Right atrial size was normal in size. Pericardium: There is no evidence of pericardial effusion. Mitral Valve: The mitral valve is normal in structure. There is mild prolapse of both leaflets of the mitral valve. Trivial mitral valve regurgitation. Tricuspid Valve: The tricuspid valve is normal in structure. Tricuspid valve regurgitation is trivial. Aortic Valve: No LVOT gradient. The aortic valve is normal in structure. Aortic valve regurgitation is not visualized. Aortic valve peak gradient measures 5.2 mmHg. Pulmonic Valve: The pulmonic valve was normal in structure. Pulmonic valve regurgitation is not visualized. Aorta: The aortic root is normal in size and structure. IAS/Shunts: No atrial level shunt detected by color flow Doppler. Additional Comments: Spectral Doppler performed. AORTIC VALVE AV Vmax:      114.00 cm/s AV Peak Grad: 5.2 mmHg [name] Bickers MD Electronically signed by [name] Bickers MD Signature Date/Time: 03/06/2022/1:30:27 PM    Final     Discharge Medications   Allergies as of 03/07/2022   No Known Allergies      Medication List     STOP taking these medications    ASPIRIN 81 PO   ibuprofen 200 MG tablet Commonly known as:  ADVIL       TAKE these medications    atorvastatin 40 MG tablet Commonly known as: LIPITOR TAKE 1 TABLET DAILY   Eliquis 5 MG Tabs tablet Generic drug: apixaban Take 1 tablet (5 mg total) by mouth 2 (two) times daily.   hydrochlorothiazide 25 MG tablet Commonly known as: HYDRODIURIL Take 1 tablet by mouth daily starting 03/09/22 Start taking on: March 09, 2022 What changed:  medication strength See the new instructions. These instructions start on March 09, 2022. If you are unsure what to do until then, ask your doctor or other care provider.   losartan 100 MG tablet Commonly known as: COZAAR TAKE 1 TABLET DAILY   metoprolol succinate 25 MG 24 hr tablet Commonly known as: TOPROL-XL Take 1 tablet (25 mg total) by mouth 2 (two) times daily. What changed:  medication strength how much to take when to take this additional instructions   spironolactone 25 MG tablet Commonly known as: ALDACTONE Take 1 tablet (25 mg total) by mouth daily. Start taking on: March 08, 2022        Disposition   The patient will be discharged in stable condition to home. Discharge Instructions     Diet - low sodium heart healthy   Complete by: As directed    Heart Failure patients record your daily weight using the same scale at the same time of day   Complete by: As directed    Increase activity slowly   Complete by: As directed    STOP any activity that causes chest pain, shortness of breath, dizziness, sweating, or exessive weakness   Complete by: As directed           Duration of Discharge Encounter: Greater than 35 minutes   Signed, Marlyce Huge, PA-C 03/07/2022, 2:09 PM  Agree with above. [place] for d/c home.   See rounding not for  full details.   Arvilla Meres, MD  3:01 PM

## 2022-03-06 NOTE — H&P (View-Only) (Signed)
Advanced Heart Failure Rounding Note  PCP-Cardiologist: None   Subjective:    Remains in flutter, NPO for TEE/DCCV later today  Still in ED wife at bedside, denies CP or SOB.   Objective:   Weight Range: 86.2 kg Body mass index is 33.66 kg/m.   Vital Signs:   Temp:  [97.7 F (36.5 C)-98.1 F (36.7 C)] 98.1 F (36.7 C) (11/15 0427) Pulse Rate:  [35-144] 38 (11/15 0730) Resp:  [10-19] 12 (11/15 0730) BP: (101-167)/(72-103) 108/83 (11/15 0730) SpO2:  [90 %-99 %] 95 % (11/15 0730) Last BM Date : 03/05/22  Weight change: Filed Weights   03/05/22 0558  Weight: 86.2 kg    Intake/Output:  No intake or output data in the 24 hours ending 03/06/22 0819    Physical Exam    General:  well appearing.  No respiratory difficulty HEENT: normal Neck: supple. JVD ~9. Carotids 2+ bilat; no bruits. No lymphadenopathy or thyromegaly appreciated. Cor: PMI nondisplaced. Irregular rate & rhythm. No rubs, gallops or murmurs. Lungs: clear Abdomen: soft, nontender, nondistended. No hepatosplenomegaly. No bruits or masses. Good bowel sounds. Extremities: no cyanosis, clubbing, rash, edema  Neuro: alert & oriented x 3, cranial nerves grossly intact. moves all 4 extremities w/o difficulty. Affect pleasant.   Telemetry   A flutter 60s (Personally reviewed)    EKG    Atrial flutter 50 bpm  Labs    CBC Recent Labs    03/05/22 0620 03/06/22 0400  WBC 6.7 6.2  NEUTROABS 4.1  --   HGB 13.6 14.4  HCT 39.5 41.2  MCV 92.9 91.6  PLT 202 197   Basic Metabolic Panel Recent Labs    65/99/35 0620  NA 139  K 4.1  CL 102  CO2 27  GLUCOSE 120*  BUN 20  CREATININE 1.14  CALCIUM 8.8*  MG 1.9   Liver Function Tests No results for input(s): "AST", "ALT", "ALKPHOS", "BILITOT", "PROT", "ALBUMIN" in the last 72 hours. No results for input(s): "LIPASE", "AMYLASE" in the last 72 hours. Cardiac Enzymes No results for input(s): "CKTOTAL", "CKMB", "CKMBINDEX", "TROPONINI" in the  last 72 hours.  BNP: BNP (last 3 results) Recent Labs    03/05/22 0620  BNP 773.2*    ProBNP (last 3 results) No results for input(s): "PROBNP" in the last 8760 hours.   D-Dimer No results for input(s): "DDIMER" in the last 72 hours. Hemoglobin A1C No results for input(s): "HGBA1C" in the last 72 hours. Fasting Lipid Panel Recent Labs    03/05/22 1516  CHOL 119  HDL 27*  LDLCALC 70  TRIG 701  CHOLHDL 4.4   Thyroid Function Tests Recent Labs    03/05/22 0801  TSH 1.677    Other results:   Imaging    No results found.   Medications:     Scheduled Medications:  apixaban  5 mg Oral BID   atorvastatin  40 mg Oral Daily   furosemide  40 mg Intravenous BID   losartan  100 mg Oral Daily   sodium chloride flush  3 mL Intravenous Q12H    Infusions:  sodium chloride      PRN Medications: sodium chloride, acetaminophen, ondansetron (ZOFRAN) IV, sodium chloride flush    Patient Profile   [name] [name] is a 61 y.o. male with PMH of HOCM, s/p septal myomectomy in 04/2006 at [facility], mild mitral regurgitation, HTN, HL with a low HDL. AHF team asked to see for SOB w/ history of HOCM.    Assessment/Plan  1. Atrial flutter: This appears typical. Based on symptoms, possibly present for about a month.  HR was in 40s-50s on admission, his baseline HR is in the 60s.  He has been taking Toprol XL 100 mg bid given history of hypertrophic cardiomyopathy. I suspect AFL has triggered CHF exacerbation.  - Continue apixaban, third dose this am - Stop Toprol XL for now with bradycardia.  - TEE-guided DCCV today after 3rd dose of Eliquis.  - Long-term, would benefit from atrial flutter ablation.   2. Acute on chronic diastolic CHF:  - Last echo in 62/83 with EF 65-70%, moderate LVH, no LVOT gradient, normal MV with no SAM or MR.   - On admission he was volume overloaded, likely due to loss of NSR.  - Got Lasix 40 mg IV bid yesterday, one more dose this am. Good UOP per  patient, nothing recorded. Keep diuresis gentle with HCM (though last echo did not show LVOT obstruction).  - echo still pending - Continue spironolactone 25 mg daily.   3. HTN:  - BP significantly elevated on admission, had been out of HCTZ.  - Would continue losartan, has tolerated this at home even with HCM.  - Continue above regimen, BP stable this am - Careful with afterload-reducing agents with HCM.   4. Hypertrophic cardiomyopathy - s/p septal myectomy in 1/08 at [facility].  Last echo in 11/21 showed EF 65-70%, no LVOT gradient, no SAM or significant MR.  - Repeat echo this admission.  - Toprol XL will remain on hold for now with bradycardia.   5. H/o CEA - Continue statin.  No ASA with Eliquis use.   6. Hyperlipidemia - LDL 70, goal LDL <55 - HDL 27 - Continue statin  Length of Stay: 1  Alen Bleacher, AGACNP-BC  03/06/2022, 8:19 AM  Advanced Heart Failure Team Pager [phone] (M-F; 7a - 5p)  Please contact [facility] for night-coverage after hours (5p -7a ) and weekends on [email or web address]  Patient seen and examined with the above-signed Advanced Practice Provider and/or Housestaff. I personally reviewed laboratory data, imaging studies and relevant notes. I independently examined the patient and formulated the important aspects of the plan. I have edited the note to reflect any of my changes or salient points. I have personally discussed the plan with the patient and/or family.  Feels ok. No CP or SOB. Remains in AFL. Now on apixaban. Denies CP or SOB.   General:  Well appearing. No resp difficulty HEENT: normal Neck: supple. no JVD. Carotids 2+ bilat; no bruits. No lymphadenopathy or thryomegaly appreciated. Cor: PMI nondisplaced. Irregular rate & rhythm. No rubs, gallops or murmurs. Lungs: clear Abdomen: soft, nontender, nondistended. No hepatosplenomegaly. No bruits or masses. Good bowel sounds. Extremities: no cyanosis, clubbing, rash, edema Neuro: alert & orientedx3,  cranial nerves grossly intact. moves all 4 extremities w/o difficulty. Affect pleasant  Plan TEE/DC-CV later today. Continue Eliquis, Repeat echo pending.   Arvilla Meres, MD  11:05 AM

## 2022-03-06 NOTE — Discharge Instructions (Signed)

## 2022-03-07 ENCOUNTER — Other Ambulatory Visit (HOSPITAL_COMMUNITY): Payer: Self-pay

## 2022-03-07 DIAGNOSIS — I4892 Unspecified atrial flutter: Secondary | ICD-10-CM | POA: Diagnosis not present

## 2022-03-07 DIAGNOSIS — I5033 Acute on chronic diastolic (congestive) heart failure: Secondary | ICD-10-CM | POA: Diagnosis not present

## 2022-03-07 LAB — BASIC METABOLIC PANEL
Anion gap: 10 (ref 5–15)
BUN: 48 mg/dL — ABNORMAL HIGH (ref 6–20)
CO2: 27 mmol/L (ref 22–32)
Calcium: 9 mg/dL (ref 8.9–10.3)
Chloride: 101 mmol/L (ref 98–111)
Creatinine, Ser: 1.61 mg/dL — ABNORMAL HIGH (ref 0.61–1.24)
GFR, Estimated: 49 mL/min — ABNORMAL LOW (ref >60)
Glucose, Bld: 134 mg/dL — ABNORMAL HIGH (ref 70–99)
Potassium: 3.6 mmol/L (ref 3.5–5.1)
Sodium: 138 mmol/L (ref 135–145)

## 2022-03-07 LAB — CBC
HCT: 43.7 % (ref 39.0–52.0)
Hemoglobin: 14.8 g/dL (ref 13.0–17.0)
MCH: 31.4 pg (ref 26.0–34.0)
MCHC: 33.9 g/dL (ref 30.0–36.0)
MCV: 92.6 fL (ref 80.0–100.0)
Platelets: 211 10*3/uL (ref 150–400)
RBC: 4.72 MIL/uL (ref 4.22–5.81)
RDW: 13.2 % (ref 11.5–15.5)
WBC: 6.6 10*3/uL (ref 4.0–10.5)
nRBC: 0 % (ref 0.0–0.2)

## 2022-03-07 MED ORDER — APIXABAN 5 MG PO TABS
5.0000 mg | ORAL_TABLET | Freq: Two times a day (BID) | ORAL | 5 refills | Status: DC
  Filled 2022-03-07: qty 60, 30d supply, fill #0

## 2022-03-07 MED ORDER — LOSARTAN POTASSIUM 50 MG PO TABS: 100.0000 mg | ORAL_TABLET | Freq: Every day | ORAL | Status: DC

## 2022-03-07 MED ORDER — LOSARTAN POTASSIUM 50 MG PO TABS: 50.0000 mg | ORAL_TABLET | Freq: Every day | ORAL | Status: DC

## 2022-03-07 MED ORDER — HYDROCHLOROTHIAZIDE 25 MG PO TABS
ORAL_TABLET | ORAL | 5 refills | Status: DC
  Filled 2022-03-07: qty 30, 30d supply, fill #0

## 2022-03-07 MED ORDER — SPIRONOLACTONE 25 MG PO TABS
25.0000 mg | ORAL_TABLET | Freq: Every day | ORAL | 5 refills | Status: DC
  Filled 2022-03-07: qty 30, 30d supply, fill #0

## 2022-03-07 MED ORDER — POTASSIUM CHLORIDE CRYS ER 20 MEQ PO TBCR
40.0000 meq | EXTENDED_RELEASE_TABLET | Freq: Once | ORAL | Status: AC
  Administered 2022-03-07: 40 meq via ORAL
  Filled 2022-03-07: qty 2

## 2022-03-07 MED ORDER — METOPROLOL SUCCINATE ER 25 MG PO TB24
25.0000 mg | ORAL_TABLET | Freq: Two times a day (BID) | ORAL | 5 refills | Status: DC
  Filled 2022-03-07: qty 60, 30d supply, fill #0

## 2022-03-07 NOTE — Anesthesia Postprocedure Evaluation (Signed)
Anesthesia Post Note  Patient: [name] [name]  Procedure(s) Performed: TRANSESOPHAGEAL ECHOCARDIOGRAM (TEE) CARDIOVERSION     Patient location during evaluation: Endoscopy Anesthesia Type: MAC Level of consciousness: awake and alert Pain management: pain level controlled Vital Signs Assessment: post-procedure vital signs reviewed and stable Respiratory status: spontaneous breathing, nonlabored ventilation, respiratory function stable and patient connected to nasal cannula oxygen Cardiovascular status: stable and blood pressure returned to baseline Postop Assessment: no apparent nausea or vomiting Anesthetic complications: no   No notable events documented.  Last Vitals:  Vitals:   03/07/22 0833 03/07/22 1236  BP: 107/72 102/85  Pulse: 73 92  Resp: 17 16  Temp: 36.6 C 36.4 C  SpO2: 94% 96%    Last Pain:  Vitals:   03/07/22 1236  TempSrc: Oral  PainSc:                  [place]

## 2022-03-07 NOTE — TOC Initial Note (Signed)
Transition of Care [facility]) - Initial/Assessment Note    Patient Details  Name: [name] [name] MRN: [id number] Date of Birth: 1960-11-29  Transition of Care [facility]) CM/SW Contact:    Elliot Cousin, RN Phone Number: [phone] 03/07/2022, 12:20 PM  Clinical Narrative:                  HF TOC CM spoke to pt and wife at bedside. Pt has scale at home for daily weights. His wife has sent his note to his job. Explained to pt about FMLA if he will be out work for long period of time.     Expected Discharge Plan: Home/Self Care Barriers to Discharge: No Barriers Identified   Patient Goals and CMS Choice        Expected Discharge Plan and Services Expected Discharge Plan: Home/Self Care   Discharge Planning Services: CM Consult   Living arrangements for the past 2 months: Single Family Home Expected Discharge Date: 03/07/22                                    Prior Living Arrangements/Services Living arrangements for the past 2 months: Single Family Home Lives with:: Spouse Patient language and need for interpreter reviewed:: Yes Do you feel safe going back to the place where you live?: Yes      Need for Family Participation in Patient Care: No (Comment) Care giver support system in place?: Yes (comment)   Criminal Activity/Legal Involvement Pertinent to Current Situation/Hospitalization: No - Comment as needed  Activities of Daily Living      Permission Sought/Granted Permission sought to share information with : Case Manager, Family Supports, PCP    Share Information with NAME: Quavis Klutz     Permission granted to share info w Relationship: wife  Permission granted to share info w Contact Information: [phone]  Emotional Assessment Appearance:: Appears stated age Attitude/Demeanor/Rapport: Gracious Affect (typically observed): Accepting Orientation: : Oriented to Self, Oriented to Place, Oriented to  Time, Oriented to Situation   Psych  Involvement: No (comment)  Admission diagnosis:  Atrial flutter (HCC) [I48.92] Bradycardia [R00.1] Atrial fibrillation and flutter (HCC) [I48.91, I48.92] Acute congestive heart failure, unspecified heart failure type [facility]) [I50.9] Patient Active Problem List   Diagnosis Date Noted   Atrial fibrillation and flutter (HCC) 03/05/2022   Carotid artery stenosis, symptomatic, right 02/25/2018   HOCM (hypertrophic obstructive cardiomyopathy) (HCC) 04/13/2013   Syncope 08/01/2010   FATIGUE / MALAISE 02/21/2010   HYPERLIPIDEMIA 06/21/2009   Benign essential HTN 06/21/2009   GERD 06/21/2009   PCP:  Ian Malkin, FNP Pharmacy:   [facility] [phone] [place], [place] - [address] AT [address] [place] [address] Phone: [phone] Fax: [phone]  [facility] - [place], [place] - [address] [place] [address] Phone: [phone] Fax: [phone]  [facility] [address] [place] [address] Phone: [phone] Fax: [phone]     Social Determinants of Health (SDOH) Interventions Food Insecurity Interventions: Intervention Not Indicated Housing Interventions: Intervention Not Indicated Transportation Interventions: Intervention Not Indicated Utilities Interventions: Intervention Not Indicated Alcohol Usage Interventions: Intervention Not Indicated (Score <7) Financial Strain Interventions: Intervention Not Indicated  Readmission Risk Interventions     No data to display

## 2022-03-07 NOTE — TOC Benefit Eligibility Note (Signed)
Patient Product/process development scientist completed.    The patient is currently admitted and upon discharge could be taking Eliquis 5 mg.  The current 30 day co-pay is $25.00.   The patient is insured through [employer]     Roland Earl, CPhT Pharmacy Patient Advocate Specialist [facility] Patient Advocate Team Direct Number: [phone]  Fax: [phone]

## 2022-03-07 NOTE — Progress Notes (Addendum)
Advanced Heart Failure Rounding Note  PCP-Cardiologist: None   Subjective:    Underwent TEE/DCCV AF to SR 11/15. Maintaining SR.  Scr bumped, 1.26>1.61.  Feels well. No dyspnea, orthopnea or PND.   TEE 11/15: EF 60-65%, severe LVH but no LVOT gradient, RV okay  Objective:   Weight Range: 85.4 kg Body mass index is 33.36 kg/m.   Vital Signs:   Temp:  [97.1 F (36.2 C)-98.6 F (37 C)] 97.8 F (36.6 C) (11/16 0833) Pulse Rate:  [60-82] 73 (11/16 0833) Resp:  [12-18] 17 (11/16 0833) BP: (86-129)/(60-86) 107/72 (11/16 0833) SpO2:  [91 %-99 %] 94 % (11/16 0833) Weight:  [85.4 kg] 85.4 kg (11/16 0504) Last BM Date : 03/06/22  Weight change: Filed Weights   03/05/22 0558 03/06/22 1018 03/07/22 0504  Weight: 86.2 kg 84 kg 85.4 kg    Intake/Output:   Intake/Output Summary (Last 24 hours) at 03/07/2022 1125 Last data filed at 03/06/2022 1308 Gross per 24 hour  Intake 200 ml  Output --  Net 200 ml      Physical Exam    General:  Well appearing. Sitting up eating lunch HEENT: normal Neck: supple. no JVD. Carotids 2+ bilat; no bruits. Cor: PMI nondisplaced. Regular rate & rhythm. No rubs, gallops or murmurs. Lungs: clear Abdomen: soft, nontender, nondistended.  Extremities: no cyanosis, clubbing, rash, edema Neuro: alert & orientedx3, cranial nerves grossly intact. moves all 4 extremities w/o difficulty. Affect pleasant   Telemetry   SR 80s-90s   Labs    CBC Recent Labs    03/05/22 0620 03/06/22 0400 03/06/22 0825 03/07/22 0216  WBC 6.7   < > 6.8 6.6  NEUTROABS 4.1  --   --   --   HGB 13.6   < > 15.0 14.8  HCT 39.5   < > 42.1 43.7  MCV 92.9   < > 91.9 92.6  PLT 202   < > 192 211   < > = values in this interval not displayed.   Basic Metabolic Panel Recent Labs    03/05/22 0620 03/06/22 0825 03/07/22 0216  NA 139 139 138  K 4.1 4.3 3.6  CL 102 104 101  CO2 27 26 27   GLUCOSE 120* 114* 134*  BUN 20 37* 48*  CREATININE 1.14 1.26*  1.61*  CALCIUM 8.8* 9.0 9.0  MG 1.9  --   --    Liver Function Tests No results for input(s): "AST", "ALT", "ALKPHOS", "BILITOT", "PROT", "ALBUMIN" in the last 72 hours. No results for input(s): "LIPASE", "AMYLASE" in the last 72 hours. Cardiac Enzymes No results for input(s): "CKTOTAL", "CKMB", "CKMBINDEX", "TROPONINI" in the last 72 hours.  BNP: BNP (last 3 results) Recent Labs    03/05/22 0620  BNP 773.2*    ProBNP (last 3 results) No results for input(s): "PROBNP" in the last 8760 hours.   D-Dimer No results for input(s): "DDIMER" in the last 72 hours. Hemoglobin A1C No results for input(s): "HGBA1C" in the last 72 hours. Fasting Lipid Panel Recent Labs    03/05/22 1516  CHOL 119  HDL 27*  LDLCALC 70  TRIG 110  CHOLHDL 4.4   Thyroid Function Tests Recent Labs    03/05/22 0801  TSH 1.677    Other results:   Imaging    ECHO TEE  Result Date: 03/06/2022    TRANSESOPHOGEAL ECHO REPORT   Patient Name:   [name] [name] Date of Exam: 03/06/2022 Medical Rec #:  [id number]  Height:       63.0 in Accession #:    [id number]         Weight:       185.1 lb Date of Birth:  April 02, 1961          BSA:          1.871 m Patient Age:    61 years           BP:           129/86 mmHg Patient Gender: M                  HR:           84 bpm. Exam Location:  Inpatient Procedure: Transesophageal Echo, Cardiac Doppler and Color Doppler Indications:     Aflutter  History:         Patient has prior history of Echocardiogram examinations, most                  recent 03/01/2020. CHF and Cardiomyopathy; Risk                  Factors:Hypertension and Dyslipidemia. S/p septal myectomy in                  1/08 at [facility].  Sonographer:     Leta Jungling RDCS Referring Phys:  [id number] ALMA L DIAZ Diagnosing Phys: Arvilla Meres MD PROCEDURE: After discussion of the risks and benefits of a TEE, an informed consent was obtained from the patient. TEE procedure time was 7 minutes. The  transesophogeal probe was passed without difficulty through the esophogus of the patient. Imaged were  obtained with the patient in a left lateral decubitus position. Sedation performed by different physician. The patient was monitored while under deep sedation. Anesthestetic sedation was provided intravenously by Anesthesiology: 209.8mg  of Propofol, 40mg  of Lidocaine. Image quality was good. The patient's vital signs; including heart rate, blood pressure, and oxygen saturation; remained stable throughout the procedure. The patient developed no complications during the procedure.  IMPRESSIONS  1. Left ventricular ejection fraction, by estimation, is 60 to 65%. The left ventricle has normal function. There is severe left ventricular hypertrophy.  2. Right ventricular systolic function is normal. The right ventricular size is normal.  3. Left atrial size was moderately dilated. No left atrial/left atrial appendage thrombus was detected.  4. The mitral valve is normal in structure. Trivial mitral valve regurgitation. There is mild prolapse of both leaflets of the mitral valve.  5. No LVOT gradient. The aortic valve is normal in structure. Aortic valve regurgitation is not visualized. Aortic valve Vmax measures 1.14 m/s. FINDINGS  Left Ventricle: Left ventricular ejection fraction, by estimation, is 60 to 65%. The left ventricle has normal function. The left ventricular internal cavity size was normal in size. There is severe left ventricular hypertrophy. Right Ventricle: The right ventricular size is normal. No increase in right ventricular wall thickness. Right ventricular systolic function is normal. Left Atrium: Left atrial size was moderately dilated. No left atrial/left atrial appendage thrombus was detected. Right Atrium: Right atrial size was normal in size. Pericardium: There is no evidence of pericardial effusion. Mitral Valve: The mitral valve is normal in structure. There is mild prolapse of both leaflets of  the mitral valve. Trivial mitral valve regurgitation. Tricuspid Valve: The tricuspid valve is normal in structure. Tricuspid valve regurgitation is trivial. Aortic Valve: No LVOT gradient. The aortic valve is normal in structure. Aortic valve regurgitation  is not visualized. Aortic valve peak gradient measures 5.2 mmHg. Pulmonic Valve: The pulmonic valve was normal in structure. Pulmonic valve regurgitation is not visualized. Aorta: The aortic root is normal in size and structure. IAS/Shunts: No atrial level shunt detected by color flow Doppler. Additional Comments: Spectral Doppler performed. AORTIC VALVE AV Vmax:      114.00 cm/s AV Peak Grad: 5.2 mmHg Glori Bickers MD Electronically signed by Glori Bickers MD Signature Date/Time: 03/06/2022/1:30:27 PM    Final      Medications:     Scheduled Medications:  apixaban  5 mg Oral BID   atorvastatin  40 mg Oral Daily   losartan  100 mg Oral Daily   potassium chloride  40 mEq Oral Once   sodium chloride flush  3 mL Intravenous Q12H   spironolactone  25 mg Oral Daily    Infusions:  sodium chloride      PRN Medications: sodium chloride, acetaminophen, ondansetron (ZOFRAN) IV, sodium chloride flush    Patient Profile   Mr. Charlebois is a 61 y.o. male with PMH of HOCM, s/p septal myomectomy in 04/2006 at [facility], mild mitral regurgitation, HTN, HL with a low HDL. AHF team asked to see for SOB w/ history of HOCM.    Assessment/Plan   1. Atrial flutter: This appears typical. Based on symptoms, had been present for about a month.  HR was in 40s-50s on admission, his baseline HR is in the 60s.  He has been taking Toprol XL 100 mg bid given history of hypertrophic cardiomyopathy. I suspect AFL has triggered CHF exacerbation.  - S/p TEE/DCCV to SR on 11/15. Maintaining SR today - Toprol XL stopped on admit d/t bradycardia. HR 80s-90s today. Will add back at reduced dose, 25 BID. - Continue apixaban 5 BID - Long-term, would benefit from atrial  flutter ablation.   2. Acute on chronic diastolic CHF:  - Echo in [id number] with EF 65-70%, moderate LVH, no LVOT gradient, normal MV with no SAM or MR.   - TEE this admit: EF 60-65%, severe LVH, no LVOT gradient, trivial MR - On admission he was volume overloaded, likely due to loss of NSR.  - Diuresed with IV lasix. Scr bumped today, 1.2>1.6. Now off diuretics. - Restart home hydrochlorothiazide but at lower dose (25 mg daily) in 2 days - Continue spironolactone 25 mg daily.   3. HTN:  - BP significantly elevated on admission, had been out of HCTZ but then recently restarted.  - Continue Losartan 100 mg daily, has tolerated this at home even with HCM.  - Add back HCTZ in 2 days as above and metoprolol xl at discharge - Careful with afterload-reducing agents with HCM.   4. Hypertrophic cardiomyopathy - s/p septal myectomy in 1/08 at [facility].   - echo in 11/21 showed EF 65-70%, no LVOT gradient, no SAM or significant MR.  - TEE this admit EF 60-65%, no LVOT gradient - Add back Toprol XL. HR now 80s-90s.  5. H/o CEA - Continue statin.  No ASA with Eliquis use.   6. Hyperlipidemia - LDL 70, goal LDL <55 - HDL 27 - Continue statin   Plan for discharge home today.    Length of Stay: 2  Marlyce Huge, PA-C 03/07/2022, 11:25 AM  Advanced Heart Failure Team Pager [phone] (M-F; 7a - 5p)  Please contact [facility] for night-coverage after hours (5p -7a ) and weekends on [email or web address]   Patient seen and examined with the above-signed Advanced Practice Provider and/or Housestaff.  I personally reviewed laboratory data, imaging studies and relevant notes. I independently examined the patient and formulated the important aspects of the plan. I have edited the note to reflect any of my changes or salient points. I have personally discussed the plan with the patient and/or family.  Remains in NSR after DC-CV. Denies SOB or CP. Scr bumped a bit.   General:  Well appearing. No resp  difficulty HEENT: normal Neck: supple. no JVD. Carotids 2+ bilat; no bruits. No lymphadenopathy or thryomegaly appreciated. Cor: PMI nondisplaced. Regular rate & rhythm. 2/6 SEM Lungs: clear Abdomen: soft, nontender, nondistended. No hepatosplenomegaly. No bruits or masses. Good bowel sounds. Extremities: no cyanosis, clubbing, rash, edema Neuro: alert & orientedx3, cranial nerves grossly intact. moves all 4 extremities w/o difficulty. Affect pleasant  Looks good. Maintaining NSR. Volume ok. Agree with med changes as above.   Glori Bickers, MD  1:04 PM

## 2022-03-10 ENCOUNTER — Encounter (HOSPITAL_COMMUNITY): Payer: Self-pay | Admitting: Internal Medicine

## 2022-03-11 ENCOUNTER — Encounter (HOSPITAL_COMMUNITY): Payer: Self-pay | Admitting: Internal Medicine

## 2022-03-11 ENCOUNTER — Ambulatory Visit (HOSPITAL_COMMUNITY)
Admission: RE | Admit: 2022-03-11 | Discharge: 2022-03-11 | Disposition: A | Discharge status: Home / Self Care | Payer: BC Managed Care – PPO | Source: Ambulatory Visit | Attending: Internal Medicine | Admitting: Internal Medicine

## 2022-03-11 VITALS — BP 130/80 | HR 60 | Wt 195.8 lb

## 2022-03-11 DIAGNOSIS — I421 Obstructive hypertrophic cardiomyopathy: Secondary | ICD-10-CM | POA: Diagnosis present

## 2022-03-11 DIAGNOSIS — I509 Heart failure, unspecified: Secondary | ICD-10-CM | POA: Insufficient documentation

## 2022-03-11 DIAGNOSIS — Z7982 Long term (current) use of aspirin: Secondary | ICD-10-CM | POA: Diagnosis not present

## 2022-03-11 DIAGNOSIS — I447 Left bundle-branch block, unspecified: Secondary | ICD-10-CM | POA: Insufficient documentation

## 2022-03-11 DIAGNOSIS — I6521 Occlusion and stenosis of right carotid artery: Secondary | ICD-10-CM | POA: Insufficient documentation

## 2022-03-11 DIAGNOSIS — I110 Hypertensive heart disease with heart failure: Secondary | ICD-10-CM | POA: Insufficient documentation

## 2022-03-11 DIAGNOSIS — Z79899 Other long term (current) drug therapy: Secondary | ICD-10-CM | POA: Insufficient documentation

## 2022-03-11 DIAGNOSIS — I491 Atrial premature depolarization: Secondary | ICD-10-CM | POA: Insufficient documentation

## 2022-03-11 DIAGNOSIS — I5022 Chronic systolic (congestive) heart failure: Secondary | ICD-10-CM

## 2022-03-11 LAB — BASIC METABOLIC PANEL
Anion gap: 12 (ref 5–15)
BUN: 20 mg/dL (ref 6–20)
CO2: 26 mmol/L (ref 22–32)
Calcium: 9.3 mg/dL (ref 8.9–10.3)
Chloride: 98 mmol/L (ref 98–111)
Creatinine, Ser: 1.09 mg/dL (ref 0.61–1.24)
GFR, Estimated: >60 mL/min (ref >60)
Glucose, Bld: 87 mg/dL (ref 70–99)
Potassium: 4 mmol/L (ref 3.5–5.1)
Sodium: 136 mmol/L (ref 135–145)

## 2022-03-11 LAB — BRAIN NATRIURETIC PEPTIDE: B Natriuretic Peptide: 111.2 pg/mL — ABNORMAL HIGH (ref 0.0–100.0)

## 2022-03-11 NOTE — Patient Instructions (Signed)
There has been no changes to your medications.  Labs done today, your results will be available in MyChart, we will contact you for abnormal readings.  Your provider has recommended that you have a home sleep study.  We have provided you with the equipment in our office today. Please download the app and follow the instructions. YOUR PIN NUMBER IS: [id number]. Once you have completed the test you just dispose of the equipment, the information is automatically uploaded to [place] via blue-tooth technology. If your test is positive for sleep apnea and you need a home CPAP machine you will be contacted by Dr Norris Cross office [facility]) to set this up.  Your physician has requested that you have cardiac CT. Cardiac computed tomography (CT) is a painless test that uses an x-ray machine to take clear, detailed pictures of your heart. For further information please visit [email or web address] Please follow instruction sheet as given.  ONCE APPROVED BY INSURANCE YOU WILL BE CALLED TO HAVE THE TEST ARRANGED.   Your physician recommends that you schedule a follow-up appointment in: 2 Months ( January 2024)  ** please call the office in [facility] December to arrange your follow up appointment **  If you have any questions or concerns before your next appointment please send [place] a message through [place] or call our office at [phone].    TO LEAVE A MESSAGE FOR THE NURSE SELECT OPTION 2, PLEASE LEAVE A MESSAGE INCLUDING: YOUR NAME DATE OF BIRTH CALL BACK NUMBER REASON FOR CALL**this is important as we prioritize the call backs  YOU WILL RECEIVE A CALL BACK THE SAME DAY AS LONG AS YOU CALL BEFORE 4:00 PM  At the Advanced Heart Failure Clinic, you and your health needs are our priority. As part of our continuing mission to provide you with exceptional heart care, we have created designated Provider Care Teams. These Care Teams include your primary Cardiologist (physician) and Advanced Practice Providers (APPs-  Physician Assistants and Nurse Practitioners) who all work together to provide you with the care you need, when you need it.   You may see any of the following providers on your designated Care Team at your next follow up: Dr Arvilla Meres Dr Marca Ancona Dr. Marcos Eke, NP Robbie Lis, [place] [facility] [place], [place] Brynda Peon, NP Karle Plumber, PharmD   Please be sure to bring in all your medications bottles to every appointment.

## 2022-03-11 NOTE — Addendum Note (Signed)
Encounter addended by: Dolores Patty, MD on: 03/11/2022 3:45 PM  Actions taken: Pend clinical note

## 2022-03-11 NOTE — Addendum Note (Signed)
Encounter addended by: Linda Hedges, RN on: 03/11/2022 4:11 PM  Actions taken: Letter saved, Charge Capture section accepted

## 2022-03-11 NOTE — Progress Notes (Signed)
Height:     Weight: BMI:  Today's Date:  STOP BANG RISK ASSESSMENT S (snore) Have you been told that you snore?     YES   T (tired) Are you often tired, fatigued, or sleepy during the day?   YES  O (obstruction) Do you stop breathing, choke, or gasp during sleep? NO   P (pressure) Do you have or are you being treated for high blood pressure? YES   B (BMI) Is your body index greater than 35 kg/m? NO   A (age) Are you 60 years old or older? YES   N (neck) Do you have a neck circumference greater than 16 inches?   NO   G (gender) Are you a male? YES   TOTAL STOP/BANG "YES" ANSWERS 5                                                                       For Office Use Only              Procedure Order Form    YES to 3+ Stop Bang questions OR two clinical symptoms - patient qualifies for WatchPAT (CPT 95800)      Clinical Notes: Will consult Sleep Specialist and refer for management of therapy due to patient increased risk of Sleep Apnea. Ordering a sleep study due to the following two clinical symptoms: Excessive daytime sleepiness G47.10 / Gastroesophageal reflux K21.9 / Nocturia R35.1 / Morning Headaches G44.221 / Difficulty concentrating R41.840 / Memory problems or poor judgment G31.84 / Personality changes or irritability R45.4 / Loud snoring R06.83 / Depression F32.9 / Unrefreshed by sleep G47.8 / Impotence N52.9 / History of high blood pressure R03.0 / Insomnia G47.00       

## 2022-03-11 NOTE — Progress Notes (Signed)
Patient ID: [name] [name], male   DOB: 10-02-1960, 61 y.o.   MRN: [id number]   Advanced Heart Failure Clinic Note   Date:  03/06/2021   ID:  MUHAMMED TEUTSCH, DOB March 11, 1961, MRN [id number]  PCP:  Arsenio Loader, FNP  Cardiologist:  Dr. Glori Bickers     History of Present Illness:  MARCIO HOQUE is a 61 y.o. male with a hx of HOCM, status post septal myomectomy in 04/2006 at [facility], mild mitral regurgitation, HTN, HL with a low HDL. (cardiac cath 2007 with normal coronaries) He was admitted to [facility] in 3/12 with syncope and atypical chest discomfort. He saw one of our cardiologists there. He had mildly elevated troponins. This was likely related to strain from his LVH. His CK-MB's were negative. Echo (06/2010): moderate to severe LVH and EF of 65%, resting LVOT peak velocity was calculated at 2.21 m/s. A Myoview study demonstrated a small, fixed defect in the apical septal segment. There was a small, reversible defect in the mid anterolateral, apical lateral segments. Findings were similar to a prior study in 05/2009. Mild apical lateral ischemia could not be ruled out and his EF was 53%.   He saw Richardson Dopp in f/u 03/2012.  F/u echo (03/2012):  Severe LVH, EF 80%, LVOT peak velocity 1.33 m/s, septal thickness 21 mm, Gr 2 DD, mild LAE.  He has been doing well since last seen. He denies chest pain, syncope, PND, edema, dyspnea.   Had ETT 04/04/14: 12:00 min [name] Protocol. 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  Admitted last week for HF in setting of atrial flutter. Underwent DC-CV.   Echo EF 60-65%. No LVOT gradient   Returns for post-hospital f/u. Says post discharge BP was low. On Friday heart was racing, had CP, arms were numb. Called EMS and were going to take [place].     Review of systems complete and found to be negative unless listed in HPI.   Recent Labs: 01/19/2021: B  Natriuretic Peptide 303.6; Creatinine, Ser 1.18; Potassium 4.1  Wt Readings from Last 3 Encounters:  03/06/21 90.3 kg (199 lb)  01/19/21 91.7 kg (202 lb 3.2 oz)  02/24/20 88.2 kg (194 lb 6 oz)     Past Medical History:  Diagnosis Date   Arthritis    "some in my hands" (02/25/2018)   Carotid artery stenosis, asymptomatic, right    Chest pain     -Lexiscan Myoview 3/12: ? small apical lateral ischemia, EF 53%; similar to study in 2/11   CHF (congestive heart failure) (HCC)    hx   Erectile dysfunction    GERD (gastroesophageal reflux disease)    Heart murmur    hx   HOCM (hypertrophic obstructive cardiomyopathy) ([place])     --cardiac cath 2007. nomrmal cors;  s/p myomectomy 2008;  Echo 3/12: mod to severe LVH, HCM, EF 65%, resting LVOT peak 2.21 m/s, mild LAE, mild MR, AV mean 9;  Echo 12/13: severe LVH, EF 80%, Gr 2 diast dsyfn, mild LAE, mild RVE   HTN (hypertension)    Hyperlipidemia     Current Outpatient Medications  Medication Sig Dispense Refill   ASPIRIN 81 PO Take 81 mg by mouth daily.     atorvastatin (LIPITOR) 40 MG tablet Take 1 tablet (40 mg total) by mouth daily. 90 tablet 3   hydrochlorothiazide (HYDRODIURIL) 50 MG tablet Take 1 tablet (50  mg total) by mouth daily. 90 tablet 3   losartan (COZAAR) 100 MG tablet TAKE 1 TABLET DAILY 90 tablet 3   metoprolol succinate (TOPROL-XL) 100 MG 24 hr tablet TAKE 1 TABLET TWICE A DAY WITH OR IMMEDIATELY FOLLOWING A MEAL (NEED TO SCHEDULE APPT WITH OUR OFFICE AT [phone] FOR FUTURE REFILLS) 180 tablet 3   No current facility-administered medications for this encounter.    Allergies:   Patient has no known allergies.   Social History:  The patient  reports that he has never smoked. He has never used smokeless tobacco. He reports that he does not currently use alcohol. He reports that he does not use drugs.   Family History:  The patient's family history includes Autism in his son; Bladder Cancer in his father; Kidney failure  in his mother; Multiple myeloma in his mother; Non-Hodgkin's lymphoma in his father.     PHYSICAL EXAM: VS:  BP 104/60   Pulse 69   Wt 90.3 kg (199 lb)   SpO2 97%   BMI 35.25 kg/m    Wt Readings from Last 3 Encounters:  03/06/21 90.3 kg (199 lb)  01/19/21 91.7 kg (202 lb 3.2 oz)  02/24/20 88.2 kg (194 lb 6 oz)   General:  Well appearing. No resp difficulty HEENT: normal Neck: supple. no JVD. Carotids 2+ bilat; no bruits. No lymphadenopathy or thryomegaly appreciated. Cor: PMI nondisplaced. Regular rate & rhythm. No rubs, gallops or murmurs. No murmur with valsalva Lungs: clear Abdomen: soft, nontender, nondistended. No hepatosplenomegaly. No bruits or masses. Good bowel sounds. Extremities: no cyanosis, clubbing, rash, edema Neuro: alert & orientedx3, cranial nerves grossly intact. moves all 4 extremities w/o difficulty. Affect pleasant   EKG: NSR 68  1 AVB (268m) LVH Personally reviewed  ASSESSMENT AND PLAN   1. Hypertrophic Obstructive Cardiomyopathy [facility]. ETT in 12/15 with excellent exercise tolerance. No arrhythmias and good BP response to exercise. He has not had any high-risk features for SCD and does not meet cirteria ICD. - Echo 9/19: No obstruction. No SAM, mild MR, LA moderately dilated - Echo 11/21 EF 65-70% No SAM or outflow tract gradient  - Continue Toprol XL 100 mg bid and losartan 1084mdaily - Doing well. Overall stable. 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 Hypertension:  - BP well controlled with systolics at home mostly [id number]- recent labs ok.   4. Carotid stenosis with stroke like symptoms - s/p R CEA in 02/2018. Followed by [facility]  - Continue ASA/statin   DaGlori BickersMD 4:08 PM

## 2022-03-11 NOTE — Addendum Note (Signed)
Encounter addended by: Dolores Patty, MD on: 03/11/2022 3:58 PM  Actions taken: Results reviewed in IB, Clinical Note Signed

## 2022-03-11 NOTE — Addendum Note (Signed)
Encounter addended by: Linda Hedges, RN on: 03/11/2022 4:08 PM  Actions taken: Order list changed, Diagnosis association updated, Clinical Note Signed

## 2022-03-12 ENCOUNTER — Encounter (HOSPITAL_BASED_OUTPATIENT_CLINIC_OR_DEPARTMENT_OTHER): Payer: BC Managed Care – PPO | Admitting: Cardiology

## 2022-03-12 ENCOUNTER — Telehealth (HOSPITAL_COMMUNITY): Payer: Self-pay

## 2022-03-12 DIAGNOSIS — G4719 Other hypersomnia: Secondary | ICD-10-CM

## 2022-03-12 DIAGNOSIS — R0683 Snoring: Secondary | ICD-10-CM | POA: Diagnosis not present

## 2022-03-12 NOTE — Telephone Encounter (Signed)
Called patient to let him know that he can do the home sleep study whenever he wants. No pre certification needed.

## 2022-03-17 NOTE — Procedures (Signed)
    SLEEP STUDY REPORT  Patient Information Study Date: 03/12/2022 Patient Name: [name] [name] Patient ID: [id number] Birth Date: [birth year] Age: 61 Gender: Male BMI: 34.8 (W=196 lb, H=5' 3'') Stopbang: 5 Referring Physician: Nicholes Mango, MD  TEST DESCRIPTION: Home sleep apnea testing was completed using the WatchPat, a Type 1 device, utilizing peripheral arterial tonometry (PAT), chest movement, actigraphy, pulse oximetry, pulse rate, body position and snore. AHI was calculated with apnea and hypopnea using valid sleep time as the denominator. RDI includes apneas, hypopneas, and RERAs. The data acquired and the scoring of sleep and all associated events were performed in accordance with the recommended standards and specifications as outlined in the AASM Manual for the Scoring of Sleep and Associated Events 2.2.0 (2015).   FINDINGS: 1.  No evidence of Obstructive Sleep Apnea with AHI 3.8/hr.  2.  No Central Sleep Apnea. 3.  Oxygen desaturations as low as 89%. 4.  Mild snoring was present. O2 sats were < 88% for 0 minutes. 5.  Total sleep time was 7 hrs and 10 min. 6.  22.9% of total sleep time was spent in REM sleep.  7.  Normal sleep onset latency at 18 min.  8.  Prolonged REM sleep onset latency at .  9.  Total awakenings were 7.   DIAGNOSIS:  Normal study with no significant sleep disordered breathing.  RECOMMENDATIONS:   1. Normal study with no significant sleep disordered breathing.  2.  Healthy sleep recommendations include:  adequate nightly sleep (normal 7-9 hrs/night), avoidance of caffeine after noon and alcohol near bedtime, and maintaining a sleep environment that is cool, dark and quiet.  3.  Weight loss for overweight patients is recommended.    4.  Snoring recommendations include:  weight loss where appropriate, side sleeping, and avoidance of alcohol before bed.  5.  Operation of motor vehicle or dangerous equipment must be avoided when feeling  drowsy, excessively sleepy, or mentally fatigued.    6.  An ENT consultation which may be useful for specific causes of and possible treatment of bothersome snoring.   7. Weight loss may be of benefit in reducing the severity of snoring.   Signature: Armanda Magic, MD; [facility]; Diplomat, American Board of Sleep Medicine Electronically Signed: 03/17/2022

## 2022-03-18 ENCOUNTER — Ambulatory Visit: Payer: BC Managed Care – PPO | Attending: Internal Medicine

## 2022-03-18 DIAGNOSIS — I5022 Chronic systolic (congestive) heart failure: Secondary | ICD-10-CM

## 2022-03-21 ENCOUNTER — Telehealth: Payer: Self-pay | Admitting: *Deleted

## 2022-03-21 NOTE — Telephone Encounter (Signed)
-----   Message from Gaynelle Cage, CMA sent at 03/21/2022  9:22 AM EST -----  ----- Message ----- From: Quintella Reichert, MD Sent: 03/17/2022  12:27 AM EST To: Cv Div Sleep Studies  Please let patient know that sleep study showed no significant sleep apnea.

## 2022-03-21 NOTE — Telephone Encounter (Signed)
The patient has been notified of the result and verbalized understanding.  All questions (if any) were answered. Latrelle Dodrill, CMA 03/21/2022 6:21 PM    Pt is aware and agreeable to normal results.

## 2022-03-25 ENCOUNTER — Other Ambulatory Visit (HOSPITAL_COMMUNITY): Payer: Self-pay | Admitting: *Deleted

## 2022-03-25 DIAGNOSIS — I5022 Chronic systolic (congestive) heart failure: Secondary | ICD-10-CM

## 2022-03-27 ENCOUNTER — Telehealth (HOSPITAL_COMMUNITY): Payer: Self-pay | Admitting: Emergency Medicine

## 2022-03-27 DIAGNOSIS — R079 Chest pain, unspecified: Secondary | ICD-10-CM

## 2022-03-27 MED ORDER — METOPROLOL TARTRATE 100 MG PO TABS: 100.0000 mg | ORAL_TABLET | Freq: Once | ORAL | 0 refills | Status: DC

## 2022-03-27 NOTE — Telephone Encounter (Signed)
Reaching out to patient to offer assistance regarding upcoming cardiac imaging study; pt verbalizes understanding of appt date/time, parking situation and where to check in, pre-test NPO status and medications ordered, and verified current allergies; name and call back number provided for further questions should they arise Rockwell Alexandria RN Navigator Cardiac Imaging [facility] [phone] office [phone] cell  100mg  metoprolol 1000 arrival  Aware nitro and contrast Holding diuretics and daily toprol xl

## 2022-03-28 ENCOUNTER — Other Ambulatory Visit (HOSPITAL_COMMUNITY): Payer: Self-pay | Admitting: Cardiology

## 2022-03-28 ENCOUNTER — Ambulatory Visit (HOSPITAL_COMMUNITY)
Admission: RE | Admit: 2022-03-28 | Discharge: 2022-03-28 | Disposition: A | Discharge status: Home / Self Care | Payer: BC Managed Care – PPO | Source: Ambulatory Visit | Attending: Internal Medicine | Admitting: Internal Medicine

## 2022-03-28 DIAGNOSIS — I5022 Chronic systolic (congestive) heart failure: Secondary | ICD-10-CM | POA: Insufficient documentation

## 2022-03-28 MED ORDER — APIXABAN 5 MG PO TABS: 5.0000 mg | ORAL_TABLET | Freq: Two times a day (BID) | ORAL | 3 refills | Status: DC

## 2022-03-28 MED ORDER — ATORVASTATIN CALCIUM 40 MG PO TABS: 40.0000 mg | ORAL_TABLET | Freq: Every day | ORAL | 3 refills | Status: DC

## 2022-03-28 MED ORDER — SPIRONOLACTONE 25 MG PO TABS: 25.0000 mg | ORAL_TABLET | Freq: Every day | ORAL | 3 refills | Status: DC

## 2022-03-28 MED ORDER — NITROGLYCERIN 0.4 MG SL SUBL
0.8000 mg | SUBLINGUAL_TABLET | Freq: Once | SUBLINGUAL | Status: AC
  Administered 2022-03-28: 0.8 mg via SUBLINGUAL

## 2022-03-28 MED ORDER — IOHEXOL 350 MG/ML SOLN
100.0000 mL | Freq: Once | INTRAVENOUS | Status: AC | PRN
  Administered 2022-03-28: 100 mL via INTRAVENOUS

## 2022-03-28 MED ORDER — HYDROCHLOROTHIAZIDE 25 MG PO TABS: ORAL_TABLET | ORAL | 3 refills | Status: DC

## 2022-03-28 MED ORDER — NITROGLYCERIN 0.4 MG SL SUBL
SUBLINGUAL_TABLET | SUBLINGUAL | Status: AC
  Filled 2022-03-28: qty 2

## 2022-03-28 MED ORDER — METOPROLOL SUCCINATE ER 25 MG PO TB24: 25.0000 mg | ORAL_TABLET | Freq: Two times a day (BID) | ORAL | 3 refills | Status: DC

## 2022-03-28 MED ORDER — LOSARTAN POTASSIUM 100 MG PO TABS: 100.0000 mg | ORAL_TABLET | Freq: Every day | ORAL | 3 refills | Status: DC

## 2022-04-01 ENCOUNTER — Telehealth (HOSPITAL_COMMUNITY): Payer: Self-pay | Admitting: *Deleted

## 2022-04-01 NOTE — Telephone Encounter (Signed)
Pt requests a call he has some questions about disability paperwork. Routed to [employer]

## 2022-04-03 ENCOUNTER — Telehealth (HOSPITAL_COMMUNITY): Payer: Self-pay | Admitting: *Deleted

## 2022-04-03 NOTE — Telephone Encounter (Signed)
Pt left vm asking about disability paperwork. I found his paperwork in the fax folder. Emer is aware that we have received paperwork. Pt aware she will notify him once completed.   Pt also asked for a call directly from Dr.Bensimhon to discuss his health and recent ct results.   Routed to Dr.Bensimhon

## 2022-04-09 ENCOUNTER — Telehealth (HOSPITAL_COMMUNITY): Payer: Self-pay

## 2022-04-09 NOTE — Telephone Encounter (Signed)
Will be filled out and signed before the week is out

## 2022-04-09 NOTE — Telephone Encounter (Signed)
Patient aware of results.

## 2022-04-09 NOTE — Telephone Encounter (Signed)
Patient called asking about CT results. It did not look like anyone had reviewed yet, so I wanted to see if you could review and let me know if he needs to do anything at this point.

## 2022-04-09 NOTE — Telephone Encounter (Signed)
Patient called asking about disability papers.  The expiration to turn in is 12/25 and he wants to make sure we can get completed and faxed over before then.  Can you check on this?

## 2022-04-12 NOTE — Telephone Encounter (Signed)
Disability forms completed, signed by Dr Gala Romney, and faxed along with requested records to [facility], pt aware this was done today

## 2022-05-16 ENCOUNTER — Ambulatory Visit (HOSPITAL_COMMUNITY)
Admission: RE | Admit: 2022-05-16 | Discharge: 2022-05-16 | Disposition: A | Discharge status: Home / Self Care | Payer: BC Managed Care – PPO | Source: Ambulatory Visit | Attending: Internal Medicine | Admitting: Internal Medicine

## 2022-05-16 ENCOUNTER — Encounter (HOSPITAL_COMMUNITY): Payer: Self-pay | Admitting: Internal Medicine

## 2022-05-16 VITALS — BP 132/80 | HR 72 | Wt 204.4 lb

## 2022-05-16 DIAGNOSIS — I2510 Atherosclerotic heart disease of native coronary artery without angina pectoris: Secondary | ICD-10-CM | POA: Diagnosis not present

## 2022-05-16 DIAGNOSIS — I6521 Occlusion and stenosis of right carotid artery: Secondary | ICD-10-CM | POA: Diagnosis not present

## 2022-05-16 DIAGNOSIS — R42 Dizziness and giddiness: Secondary | ICD-10-CM | POA: Insufficient documentation

## 2022-05-16 DIAGNOSIS — I421 Obstructive hypertrophic cardiomyopathy: Secondary | ICD-10-CM

## 2022-05-16 DIAGNOSIS — Z79899 Other long term (current) drug therapy: Secondary | ICD-10-CM | POA: Insufficient documentation

## 2022-05-16 DIAGNOSIS — I480 Paroxysmal atrial fibrillation: Secondary | ICD-10-CM

## 2022-05-16 DIAGNOSIS — R0602 Shortness of breath: Secondary | ICD-10-CM | POA: Insufficient documentation

## 2022-05-16 DIAGNOSIS — I110 Hypertensive heart disease with heart failure: Secondary | ICD-10-CM | POA: Diagnosis not present

## 2022-05-16 DIAGNOSIS — G4733 Obstructive sleep apnea (adult) (pediatric): Secondary | ICD-10-CM | POA: Diagnosis not present

## 2022-05-16 DIAGNOSIS — Z7901 Long term (current) use of anticoagulants: Secondary | ICD-10-CM | POA: Diagnosis not present

## 2022-05-16 DIAGNOSIS — I5022 Chronic systolic (congestive) heart failure: Secondary | ICD-10-CM

## 2022-05-16 MED ORDER — ATORVASTATIN CALCIUM 80 MG PO TABS: 80.0000 mg | ORAL_TABLET | Freq: Every day | ORAL | 3 refills | Status: DC

## 2022-05-16 NOTE — Progress Notes (Signed)
Patient ID: [name] [name], male   DOB: 02-12-1961, 62 y.o.   MRN: [id number]   Advanced Heart Failure Clinic Note   Date:  05/16/2022   ID:  [name] [name], DOB 12-31-1960, MRN [id number]  PCP:  Arsenio Loader, FNP  Cardiologist:  Dr. Glori Bickers     History of Present Illness:  [name] [name] is a 62 y.o. male with a hx of HOCM, status post septal myomectomy in 04/2006 at [facility], mild mitral regurgitation, HTN, HL with a low HDL. (cardiac cath 2007 with normal coronaries) He was admitted to [facility] in 3/12 with syncope and atypical chest discomfort. He saw one of our cardiologists there. He had mildly elevated troponins. This was likely related to strain from his LVH. His CK-MB's were negative. Echo (06/2010): moderate to severe LVH and EF of 65%, resting LVOT peak velocity was calculated at 2.21 m/s. A Myoview study demonstrated a small, fixed defect in the apical septal segment. There was a small, reversible defect in the mid anterolateral, apical lateral segments. Findings were similar to a prior study in 05/2009. Mild apical lateral ischemia could not be ruled out and his EF was 53%.   He saw Richardson Dopp in f/u 03/2012.  F/u echo (03/2012):  Severe LVH, EF 80%, LVOT peak velocity 1.33 m/s, septal thickness 21 mm, Gr 2 DD, mild LAE.  He has been doing well since last seen. He denies chest pain, syncope, PND, edema, dyspnea.   Had ETT 04/04/14: 12:00 min [name] Protocol. 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  Admitted 11/23 for HF in setting of atrial flutter. Underwent DC-CV.   Echo EF 60-65%. No LVOT gradient   Home sleep study 11/23 AHI 3.8   Returns for f/u. Still has SOB and dizziness with exertion. No palpitations. Worried about going back to work. No CP.   Review of systems complete and found to be negative unless listed in HPI.   Recent Labs: 03/05/2022: HDL 27; LDL Cholesterol  70; TSH 1.677 03/07/2022: Hemoglobin 14.8 03/11/2022: B Natriuretic Peptide 111.2; Creatinine, Ser 1.09; Potassium 4.0  Wt Readings from Last 3 Encounters:  05/16/22 92.7 kg (204 lb 6.4 oz)  03/11/22 88.8 kg (195 lb 12.8 oz)  03/07/22 85.4 kg (188 lb 4.8 oz)     Past Medical History:  Diagnosis Date   Arthritis    "some in my hands" (02/25/2018)   Carotid artery stenosis, asymptomatic, right    Chest pain     -Lexiscan Myoview 3/12: ? small apical lateral ischemia, EF 53%; similar to study in 2/11   CHF (congestive heart failure) (HCC)    hx   Erectile dysfunction    GERD (gastroesophageal reflux disease)    Heart murmur    hx   HOCM (hypertrophic obstructive cardiomyopathy) ([place])     --cardiac cath 2007. nomrmal cors;  s/p myomectomy 2008;  Echo 3/12: mod to severe LVH, HCM, EF 65%, resting LVOT peak 2.21 m/s, mild LAE, mild MR, AV mean 9;  Echo 12/13: severe LVH, EF 80%, Gr 2 diast dsyfn, mild LAE, mild RVE   HTN (hypertension)    Hyperlipidemia     Current Outpatient Medications  Medication Sig Dispense Refill   apixaban (ELIQUIS) 5 MG TABS tablet Take 1 tablet (5 mg total) by mouth 2 (two) times daily. 180 tablet 3   atorvastatin (LIPITOR) 40 MG tablet Take 1 tablet (40 mg  total) by mouth daily. 90 tablet 3   hydrochlorothiazide (HYDRODIURIL) 25 MG tablet Take 1 tablet by mouth daily starting 03/09/22 90 tablet 3   losartan (COZAAR) 100 MG tablet Take 1 tablet (100 mg total) by mouth daily. 90 tablet 3   metoprolol succinate (TOPROL-XL) 25 MG 24 hr tablet Take 1 tablet (25 mg total) by mouth 2 (two) times daily. 180 tablet 3   spironolactone (ALDACTONE) 25 MG tablet Take 1 tablet (25 mg total) by mouth daily. 90 tablet 3   No current facility-administered medications for this encounter.    Allergies:   Patient has no known allergies.   Social History:  The patient  reports that he has never smoked. He has never used smokeless tobacco. He reports that he does not  currently use alcohol. He reports that he does not use drugs.   Family History:  The patient's family history includes Autism in his son; Bladder Cancer in his father; Kidney failure in his mother; Multiple myeloma in his mother; Non-Hodgkin's lymphoma in his father.     PHYSICAL EXAM: VS:  BP 132/80   Pulse 72   Wt 92.7 kg (204 lb 6.4 oz)   SpO2 97%   BMI 36.21 kg/m    Wt Readings from Last 3 Encounters:  05/16/22 92.7 kg (204 lb 6.4 oz)  03/11/22 88.8 kg (195 lb 12.8 oz)  03/07/22 85.4 kg (188 lb 4.8 oz)   General:  Well appearing. No resp difficulty HEENT: normal Neck: supple. no JVD. Carotids 2+ bilat; no bruits. No lymphadenopathy or thryomegaly appreciated. Cor: PMI nondisplaced. Regular rate & rhythm. No rubs, gallops or murmurs. Lungs: clear Abdomen: soft, nontender, nondistended. No hepatosplenomegaly. No bruits or masses. Good bowel sounds. Extremities: no cyanosis, clubbing, rash, edema Neuro: alert & orientedx3, cranial nerves grossly intact. moves all 4 extremities w/o difficulty. Affect pleasant  ASSESSMENT AND PLAN   1. 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 Asymptomatic. ETT in 12/15 with excellent exercise tolerance. No arrhythmias and good BP response to exercise. He has not had any high-risk features for SCD and does not meet cirteria ICD. - Echo 9/19: No obstruction. No SAM, mild MR, LA moderately dilated - Echo 11/21 EF 65-70% No SAM or outflow tract gradient  - Continue Toprol XL 100 mg bid and losartan 100mg  daily - Stable. No LVOT gradient so not candidate for mavacamtan - Ok to return to work with limitation of rest breaks as needed and no lifting > 30 pounds  2. Hypertension:  - BP well controlled with systolics at home mostly [phone]  - recent labs ok.   4. Carotid stenosis with stroke like symptoms - s/p R CEA in 02/2018. Followed by [facility]  - Continue ASA/statin  5. CAD,  non-obstructive - cardiac CT 12/23. non-obstructive CAD in LAD and LCX (25-49%) - Continue statin last LDL &) - Increase atorva to 80 daily - Off ASA due to Eliquis  6. 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

## 2022-05-16 NOTE — Patient Instructions (Signed)
INCREASE Crestor to 80mg  daily.  Your physician recommends that you schedule a follow-up appointment in: 6 months (July 2024)  ** please call the office in May to arrange your follow up appointment. **  If you have any questions or concerns before your next appointment please send [place] a message through [place] or call our office at [phone].    TO LEAVE A MESSAGE FOR THE NURSE SELECT OPTION 2, PLEASE LEAVE A MESSAGE INCLUDING: YOUR NAME DATE OF BIRTH CALL BACK NUMBER REASON FOR CALL**this is important as we prioritize the call backs  YOU WILL RECEIVE A CALL BACK THE SAME DAY AS LONG AS YOU CALL BEFORE 4:00 PM At the [facility], you and your health needs are our priority. As part of our continuing mission to provide you with exceptional heart care, we have created designated Provider Care Teams. These Care Teams include your primary Cardiologist (physician) and Advanced Practice Providers (APPs- Physician Assistants and Nurse Practitioners) who all work together to provide you with the care you need, when you need it.   You may see any of the following providers on your designated Care Team at your next follow up: Dr Glori Bickers Dr Loralie Champagne Dr. Roxana Hires, NP Lyda Jester, [place] [facility] [place], [place] Forestine Na, NP Audry Riles, PharmD   Please be sure to bring in all your medications bottles to every appointment.

## 2022-05-21 ENCOUNTER — Telehealth (HOSPITAL_COMMUNITY): Payer: Self-pay | Admitting: Cardiology

## 2022-05-21 NOTE — Telephone Encounter (Signed)
Patient called to request clinical documentation be sent to [place] attn [place]. Fax # [phone] Case # [id number]

## 2022-05-24 ENCOUNTER — Encounter (HOSPITAL_COMMUNITY): Payer: Self-pay

## 2022-05-24 ENCOUNTER — Telehealth (HOSPITAL_COMMUNITY): Payer: Self-pay | Admitting: *Deleted

## 2022-05-24 NOTE — Telephone Encounter (Signed)
Pt left vm requesting a call about diability. Pt said his return to work letter needed clarification for disability.  Routed to [employer]

## 2022-05-24 NOTE — Telephone Encounter (Signed)
Spoke with patient and gave him updated letter and also faxed it per his request to [place] at [phone]

## 2022-06-10 ENCOUNTER — Encounter: Payer: Self-pay | Admitting: Physician Assistant

## 2022-06-10 ENCOUNTER — Telehealth: Payer: Self-pay | Admitting: Internal Medicine

## 2022-06-10 NOTE — Telephone Encounter (Signed)
Pt returned call

## 2022-06-10 NOTE — Telephone Encounter (Signed)
Left message for pt to call back.  Pt states he is still seeing BRB every morning with BM. He reports it clears up as the day goes on. Discussed with him that he may have internal hemorrhoids and that he may want to try prep h supp otc and also soaking in a warm tub 39mn at a time twice a day. He verbalized understanding and will keep his OV.

## 2022-06-10 NOTE — Telephone Encounter (Signed)
PT spouse is calling because of blood in stool. She explained that it is a lot with every BM. She scheduled an appointment on 3/21 but wants to know what she can do for relief. Please advise

## 2022-06-13 ENCOUNTER — Other Ambulatory Visit (HOSPITAL_COMMUNITY): Payer: Self-pay

## 2022-06-14 ENCOUNTER — Telehealth (HOSPITAL_COMMUNITY): Payer: Self-pay

## 2022-06-14 NOTE — Telephone Encounter (Signed)
Patient is calling asking about disability forms. Can you follow up?

## 2022-06-18 NOTE — Telephone Encounter (Signed)
Left message for patient letting him know status updaate

## 2022-06-18 NOTE — Telephone Encounter (Signed)
Patient is calling back about status of medical record information he needs for his disability. Can you check on this?

## 2022-06-19 NOTE — Telephone Encounter (Signed)
Spoke with patient and faxed updated OV to [facility]

## 2022-06-21 NOTE — Telephone Encounter (Signed)
Pt returned call to check the status of fax Reports he called office on 2/29 to request a refax reports fax on2/28 [place] did not receive as fax was down  -advised pt per Dannette Barbara, RN second fax was returned 06/20/2022  -pt called a second time to report fax was  not received by [place]  Emailed most recent OV to [email or web address] as requested by patient

## 2022-06-27 ENCOUNTER — Telehealth (HOSPITAL_COMMUNITY): Payer: Self-pay

## 2022-06-27 NOTE — Telephone Encounter (Signed)
Spoke with patient about the information he gave [place] in regards to his work. Spoke with Dr. Haroldine Laws in regards to this and he stated max limit for lifting it 32lb, pulling pushing with force is 45lbs. Asked patient to reach out to [employer] to ask them what information do they need from [place] to assist the patient.

## 2022-07-11 ENCOUNTER — Ambulatory Visit: Payer: BC Managed Care – PPO | Admitting: Physician Assistant

## 2022-07-12 ENCOUNTER — Ambulatory Visit (INDEPENDENT_AMBULATORY_CARE_PROVIDER_SITE_OTHER): Payer: BC Managed Care – PPO | Admitting: Gastroenterology

## 2022-07-12 ENCOUNTER — Encounter: Payer: Self-pay | Admitting: Gastroenterology

## 2022-07-12 ENCOUNTER — Telehealth: Payer: Self-pay | Admitting: *Deleted

## 2022-07-12 VITALS — BP 112/76 | HR 73 | Ht 63.0 in | Wt 205.0 lb

## 2022-07-12 DIAGNOSIS — R1013 Epigastric pain: Secondary | ICD-10-CM

## 2022-07-12 DIAGNOSIS — Z1211 Encounter for screening for malignant neoplasm of colon: Secondary | ICD-10-CM | POA: Insufficient documentation

## 2022-07-12 DIAGNOSIS — Z7901 Long term (current) use of anticoagulants: Secondary | ICD-10-CM | POA: Diagnosis not present

## 2022-07-12 DIAGNOSIS — K625 Hemorrhage of anus and rectum: Secondary | ICD-10-CM | POA: Diagnosis not present

## 2022-07-12 DIAGNOSIS — R1319 Other dysphagia: Secondary | ICD-10-CM

## 2022-07-12 DIAGNOSIS — G8929 Other chronic pain: Secondary | ICD-10-CM

## 2022-07-12 MED ORDER — NA SULFATE-K SULFATE-MG SULF 17.5-3.13-1.6 GM/177ML PO SOLN: 1.0000 | Freq: Once | ORAL | 0 refills | Status: AC

## 2022-07-12 NOTE — Telephone Encounter (Signed)
[facility] Pre-operative Risk Assessment     Request for surgical clearance:     Endoscopy Procedure  What type of surgery is being performed?     EGD/Colon  When is this surgery scheduled?     09/05/22  What type of clearance is required ?   Pharmacy  Are there any medications that need to be held prior to surgery and how long? Eliquis 2 days  Practice name and name of physician performing surgery?      [facility]  What is your office phone and fax number?      Phone- [phone]  [phone]  Anesthesia type (None, local, MAC, general) ?       MAC

## 2022-07-12 NOTE — Progress Notes (Signed)
07/12/2022 [name] [name] [id number] February 10, 1961   HISTORY OF PRESENT ILLNESS: This is a 62 year old male who is a patient of Dr. Vena Rua, last seen here in 2018.  He is here today for a couple of different issues.  He was due for colonoscopy December 2023.  He was placed on Eliquis back in the fall, follows with Dr. Haroldine Laws.  Now since January he has been having bright red rectal bleeding in the toilet bowl, in the stool, and on the toilet paper with bowel movements, just about every time he has a BM.  Feels that he moves his bowels regularly without constipation/straining or diarrhea.  He reports that on occasion he feels like food gets hung up when he swallows.  The other day he was eating a hotdog and [place] fries and felt like it got hung up.  This does not happen regularly on a daily basis, etc.  He also reports having pain in his upper abdomen.  Looks like he discussed this with Dr. Hilarie Fredrickson back in 2018.  They did some labs including celiac labs and H. pylori testing, which were all negative.  They discussed possible endoscopy versus gallbladder evaluation.  He never returned for follow-up.  He tells me that he gets his intermittent pain in the upper abdomen.  He notices it mostly when he eats nuts, even like peanut butter, etc.  He denies any significant heartburn or reflux type symptoms.  Referred here by on this occasion by Beola Cord, FNP.   Past Medical History:  Diagnosis Date   Arthritis    "some in my hands" (02/25/2018)   Carotid artery stenosis, asymptomatic, right    Chest pain     -Lexiscan Myoview 3/12: ? small apical lateral ischemia, EF 53%; similar to study in 2/11   CHF (congestive heart failure) (HCC)    hx   Erectile dysfunction    GERD (gastroesophageal reflux disease)    Heart murmur    hx   HOCM (hypertrophic obstructive cardiomyopathy) ([place])     --cardiac cath 2007. nomrmal cors;  s/p myomectomy 2008;  Echo 3/12: mod to severe LVH, HCM, EF 65%,  resting LVOT peak 2.21 m/s, mild LAE, mild MR, AV mean 9;  Echo 12/13: severe LVH, EF 80%, Gr 2 diast dsyfn, mild LAE, mild RVE   HTN (hypertension)    Hyperlipidemia    Past Surgical History:  Procedure Laterality Date   CARDIAC CATHETERIZATION  04/14/2006   CARDIOVERSION N/A 03/06/2022   Procedure: CARDIOVERSION;  Surgeon: Jolaine Artist, MD;  Location: [facility];  Service: Cardiovascular;  Laterality: N/A;   CAROTID ENDARTERECTOMY Right 02/25/2018   ENDARTERECTOMY Right 02/25/2018   Procedure: ENDARTERECTOMY CAROTID RIGHT;  Surgeon: Rosetta Posner, MD;  Location: [facility];  Service: Vascular;  Laterality: Right;   MYOMECTOMY  04/2006   at [facility] N/A 03/06/2022   Procedure: TRANSESOPHAGEAL ECHOCARDIOGRAM (TEE);  Surgeon: Jolaine Artist, MD;  Location: [facility] END[facility];  Service: Cardiovascular;  Laterality: N/A;    reports that he has never smoked. He has never used smokeless tobacco. He reports that he does not currently use alcohol. He reports that he does not use drugs. family history includes Autism in his son; Bladder Cancer in his father; Kidney failure in his mother; Multiple myeloma in his mother; Non-Hodgkin's lymphoma in his father. No Known Allergies    Outpatient Encounter Medications as of 07/12/2022  Medication Sig   apixaban (ELIQUIS) 5 MG TABS  tablet Take 1 tablet (5 mg total) by mouth 2 (two) times daily.   atorvastatin (LIPITOR) 80 MG tablet Take 1 tablet (80 mg total) by mouth daily.   hydrochlorothiazide (HYDRODIURIL) 25 MG tablet Take 1 tablet by mouth daily starting 03/09/22   losartan (COZAAR) 100 MG tablet Take 1 tablet (100 mg total) by mouth daily.   metoprolol succinate (TOPROL-XL) 25 MG 24 hr tablet Take 1 tablet (25 mg total) by mouth 2 (two) times daily.   spironolactone (ALDACTONE) 25 MG tablet Take 1 tablet (25 mg total) by mouth daily.   No facility-administered encounter medications on file as of 07/12/2022.      REVIEW OF SYSTEMS  : All other systems reviewed and negative except where noted in the History of Present Illness.   PHYSICAL EXAM: BP 112/76   Pulse 73   Ht 5\' 3"  (1.6 m)   Wt 205 lb (93 kg)   BMI 36.31 kg/m  General: Well developed white male in no acute distress Head: Normocephalic and atraumatic Eyes:  Sclerae anicteric, conjunctiva pink. Ears: Normal auditory acuity Lungs: Clear throughout to auscultation; no W/R/R. Heart: Regular rate and rhythm; no M/R/G. Abdomen: Soft, non-distended.  BS present.  Non-tender. Rectal:  Will be done at the time of colonoscopy. Musculoskeletal: Symmetrical with no gross deformities  Skin: No lesions on visible extremities Extremities: No edema  Neurological: Alert oriented x 4, grossly non-focal Psychological:  Alert and cooperative. Normal mood and affect  ASSESSMENT AND PLAN: *CRC screening:  Was due for colonoscopy 03/2022.  Has been having rectal bleeding with most BMs now for the past couple of months.  This is in the setting of starting Eliquis for anticoagulation back in the fall.  Possibly hemorrhoidal source.  Offered hydrocortisone suppositories to use at bedtime in the interim while awaiting colonoscopy, but patient declined. *Dysphagia:  Occasional issues with solid food getting stuck, not often.  Will perform EGD with possible dilation with Dr. Hilarie Fredrickson as well. *Epigastric abdominal pain: This has been occurring intermittently for years.  Discussed this with Dr. Hilarie Fredrickson back in 2018.  At that time labs and H. pylori study were negative.  They discussed possible gallbladder evaluation.  If endoscopy and colonoscopy prove unrevealing, could consider gallbladder evaluation. *Chronic anticoagulation:  Will hold Eliquis 2 days prior to endoscopic procedures - will instruct when and how to resume after procedure. Benefits and risks of procedure explained including risks of bleeding, perforation, infection, missed lesions, reactions to  medications and possible need for hospitalization and surgery for complications. Additional rare but real risk of stroke or other vascular clotting events off of Eliquis also explained and need to seek urgent help if any signs of these problems occur. Will communicate by phone or EMR with patient's prescribing provider, Dr. Haroldine Laws to confirm that holding Eliquis is reasonable in this case.     CC:  Arsenio Loader, FNP

## 2022-07-12 NOTE — Patient Instructions (Signed)
You have been scheduled for an endoscopy and colonoscopy. Please follow the written instructions given to you at your visit today. Please pick up your prep supplies at the pharmacy within the next 1-3 days. If you use inhalers (even only as needed), please bring them with you on the day of your procedure.  _______________________________________________________  If your blood pressure at your visit was 140/90 or greater, please contact your primary care physician to follow up on this.  _______________________________________________________  If you are age 18 or older, your body mass index should be between 23-30. Your Body mass index is 36.31 kg/m. If this is out of the aforementioned range listed, please consider follow up with your Primary Care Provider.  If you are age 1 or younger, your body mass index should be between 19-25. Your Body mass index is 36.31 kg/m. If this is out of the aformentioned range listed, please consider follow up with your Primary Care Provider.   ________________________________________________________  The [facility] providers would like to encourage you to use [facility] to communicate with providers for non-urgent requests or questions.  Due to long hold times on the telephone, sending your provider a message by [facility] may be a faster and more efficient way to get a response.  Please allow 48 business hours for a response.  Please remember that this is for non-urgent requests.  _______________________________________________________

## 2022-07-12 NOTE — Addendum Note (Signed)
Addended by: Alonza Bogus D on: 07/12/2022 11:07 AM   Modules accepted: Level of Service

## 2022-07-15 NOTE — Telephone Encounter (Signed)
Dr. Haroldine Laws, we have received request for clearance for upcoming EGD/colonoscopy.  Per office protocol, patient will need clearance from Heart Failure provider. He was seen by you on 05/16/22.  I have routed clearance to hold [name] [name] to Pharm.D.   Please send your response to p cv div preop.  Thank you, [name] [name]

## 2022-07-15 NOTE — Telephone Encounter (Signed)
Patient with diagnosis of afib on Eliquis for anticoagulation.    Procedure: EGD/colonoscopy Date of procedure: 09/05/22  CHA2DS2-VASc Score = 2  This indicates a 2.2% annual risk of stroke. The patient's score is based upon: CHF History: 0 (does have HOCM) HTN History: 1 Diabetes History: 0 Stroke History: 0 Vascular Disease History: 1 Age Score: 0 Gender Score: 0   CrCl 89mL/min using adj body weight Platelet count 211K  Per office protocol, patient can hold Eliquis for 2 days prior to procedure as requested.    **This guidance is not considered finalized until pre-operative APP has relayed final recommendations.**

## 2022-07-16 NOTE — Telephone Encounter (Signed)
   Primary Cardiologist: Glori Bickers, MD  Chart reviewed as part of pre-operative protocol coverage. Given past medical history and time since last visit, based on ACC/AHA guidelines, [name] [name] would be at acceptable risk for the planned procedure without further cardiovascular testing.   Patient was advised that if he develops new symptoms prior to surgery to contact our office to arrange a follow-up appointment.  He verbalized understanding.  Per office protocol, patient can hold Eliquis for 2 days prior to procedure as requested.    I will route this recommendation to the requesting party via Epic fax function and remove from pre-op pool.  Please call with questions.  Emmaline Life, NP-C  07/16/2022, 5:23 AM [address] Office [phone] Fax [phone]

## 2022-07-20 NOTE — Progress Notes (Signed)
Addendum: Reviewed and agree with assessment and management plan. ,  M, MD  

## 2022-07-31 NOTE — Telephone Encounter (Signed)
Left message for patient to call office.  

## 2022-07-31 NOTE — Telephone Encounter (Signed)
Patient informed to hold Eliquis.  

## 2022-08-22 ENCOUNTER — Ambulatory Visit: Payer: BC Managed Care – PPO | Admitting: Physician Assistant

## 2022-08-23 ENCOUNTER — Telehealth (HOSPITAL_COMMUNITY): Payer: Self-pay

## 2022-08-23 NOTE — Telephone Encounter (Signed)
Will do thank you

## 2022-08-23 NOTE — Telephone Encounter (Signed)
Patient called and wants [place] to hold off on new disability forms until he is seen on the 9th.

## 2022-08-29 ENCOUNTER — Encounter (HOSPITAL_COMMUNITY): Payer: Self-pay | Admitting: Internal Medicine

## 2022-08-29 ENCOUNTER — Ambulatory Visit (HOSPITAL_COMMUNITY)
Admission: RE | Admit: 2022-08-29 | Discharge: 2022-08-29 | Disposition: A | Discharge status: Home / Self Care | Payer: BC Managed Care – PPO | Source: Ambulatory Visit | Attending: Internal Medicine | Admitting: Internal Medicine

## 2022-08-29 VITALS — BP 124/80 | HR 74 | Wt 199.4 lb

## 2022-08-29 DIAGNOSIS — I421 Obstructive hypertrophic cardiomyopathy: Secondary | ICD-10-CM | POA: Diagnosis not present

## 2022-08-29 DIAGNOSIS — I509 Heart failure, unspecified: Secondary | ICD-10-CM | POA: Diagnosis not present

## 2022-08-29 DIAGNOSIS — Z8673 Personal history of transient ischemic attack (TIA), and cerebral infarction without residual deficits: Secondary | ICD-10-CM | POA: Insufficient documentation

## 2022-08-29 DIAGNOSIS — I110 Hypertensive heart disease with heart failure: Secondary | ICD-10-CM | POA: Insufficient documentation

## 2022-08-29 DIAGNOSIS — Z79899 Other long term (current) drug therapy: Secondary | ICD-10-CM | POA: Insufficient documentation

## 2022-08-29 DIAGNOSIS — E785 Hyperlipidemia, unspecified: Secondary | ICD-10-CM | POA: Insufficient documentation

## 2022-08-29 DIAGNOSIS — I2510 Atherosclerotic heart disease of native coronary artery without angina pectoris: Secondary | ICD-10-CM | POA: Insufficient documentation

## 2022-08-29 DIAGNOSIS — I6529 Occlusion and stenosis of unspecified carotid artery: Secondary | ICD-10-CM

## 2022-08-29 DIAGNOSIS — I10 Essential (primary) hypertension: Secondary | ICD-10-CM | POA: Diagnosis not present

## 2022-08-29 DIAGNOSIS — I480 Paroxysmal atrial fibrillation: Secondary | ICD-10-CM

## 2022-08-29 DIAGNOSIS — Z7901 Long term (current) use of anticoagulants: Secondary | ICD-10-CM | POA: Diagnosis not present

## 2022-08-29 DIAGNOSIS — G4733 Obstructive sleep apnea (adult) (pediatric): Secondary | ICD-10-CM | POA: Insufficient documentation

## 2022-08-29 NOTE — Patient Instructions (Signed)
There has been no changes to your medications.  Your physician recommends that you schedule a follow-up appointment in: 3 months  If you have any questions or concerns before your next appointment please send us a message through mychart or call our office at [phone].    TO LEAVE A MESSAGE FOR THE NURSE SELECT OPTION 2, PLEASE LEAVE A MESSAGE INCLUDING: YOUR NAME DATE OF BIRTH CALL BACK NUMBER REASON FOR CALL**this is important as we prioritize the call backs  YOU WILL RECEIVE A CALL BACK THE SAME DAY AS LONG AS YOU CALL BEFORE 4:00 PM  At the [facility], you and your health needs are our priority. As part of our continuing mission to provide you with exceptional heart care, we have created designated Provider Care Teams. These Care Teams include your primary Cardiologist (physician) and Advanced Practice Providers (APPs- Physician Assistants and Nurse Practitioners) who all work together to provide you with the care you need, when you need it.   You may see any of the following providers on your designated Care Team at your next follow up: Dr Daniel Bensimhon Dr Dalton McLean Dr. Aditya Sabharwal Amy Clegg, NP Brittainy Simmons, PA Jessica Milford,NP Lindsay Finch, PA Alma Diaz, NP Lauren Kemp, PharmD   Please be sure to bring in all your medications bottles to every appointment.    Thank you for choosing [facility]    

## 2022-08-29 NOTE — Progress Notes (Addendum)
Patient ID: [name] [name], male   DOB: 05/18/1960, 62 y.o.   MRN: [id number]   [facility] Note   Date:  08/29/2022   ID:  [name] [name], DOB Jan 15, 1961, MRN [id number]  PCP:  Ian Malkin, FNP  Cardiologist:  Dr. Arvilla Meres     History of Present Illness:  [name] [name] is a 62 y.o. male with a hx of HOCM, status post septal myomectomy in 04/2006 at [facility], mild mitral regurgitation, HTN, HL with a low HDL. (cardiac cath 2007 with normal coronaries) He was admitted to [facility] in 3/12 with syncope and atypical chest discomfort. He saw one of our cardiologists there. He had mildly elevated troponins. This was likely related to strain from his LVH. His CK-MB's were negative. Echo (06/2010): moderate to severe LVH and EF of 65%, resting LVOT peak velocity was calculated at 2.21 m/s. A Myoview study demonstrated a small, fixed defect in the apical septal segment. There was a small, reversible defect in the mid anterolateral, apical lateral segments. Findings were similar to a prior study in 05/2009. Mild apical lateral ischemia could not be ruled out and his EF was 53%.   F/u echo (03/2012):  Severe LVH, EF 80%, LVOT peak velocity 1.33 m/s, septal thickness 21 mm, Gr 2 DD, mild LAE.  He has been doing well since last seen. He denies chest pain, syncope, PND, edema, dyspnea.   Had ETT 04/04/14: 12:00 min [name] Protocol. 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  Admitted 11/23 for HF in setting of atrial flutter. Underwent DC-CV.   Echo EF 60-65%. No LVOT gradient   Home sleep study 11/23 AHI 3.8   Returns for f/u. Remains on short-term disability. Able to do ADLs without too much difficulty. However SOB with relatively mild activity and poor endurance. Dizzy when bending over. No CP, edema, orthopnea or PND.   Review of systems complete and found to be negative unless listed in  HPI.   Recent Labs: 03/05/2022: HDL 27; LDL Cholesterol 70; TSH 1.677 03/07/2022: Hemoglobin 14.8 03/11/2022: B Natriuretic Peptide 111.2; Creatinine, Ser 1.09; Potassium 4.0  Wt Readings from Last 3 Encounters:  08/29/22 90.4 kg (199 lb 6.4 oz)  07/12/22 93 kg (205 lb)  05/16/22 92.7 kg (204 lb 6.4 oz)     Past Medical History:  Diagnosis Date   Arthritis    "some in my hands" (02/25/2018)   Carotid artery stenosis, asymptomatic, right    Chest pain     -Lexiscan Myoview 3/12: ? small apical lateral ischemia, EF 53%; similar to study in 2/11   CHF (congestive heart failure) (HCC)    hx   Erectile dysfunction    GERD (gastroesophageal reflux disease)    Heart murmur    hx   HOCM (hypertrophic obstructive cardiomyopathy) (HCC)     --cardiac cath 2007. nomrmal cors;  s/p myomectomy 2008;  Echo 3/12: mod to severe LVH, HCM, EF 65%, resting LVOT peak 2.21 m/s, mild LAE, mild MR, AV mean 9;  Echo 12/13: severe LVH, EF 80%, Gr 2 diast dsyfn, mild LAE, mild RVE   HTN (hypertension)    Hyperlipidemia     Current Outpatient Medications  Medication Sig Dispense Refill   apixaban (ELIQUIS) 5 MG TABS tablet Take 1 tablet (5 mg total) by mouth 2 (two) times daily. 180 tablet 3   atorvastatin (LIPITOR) 80 MG tablet Take  1 tablet (80 mg total) by mouth daily. 90 tablet 3   hydrochlorothiazide (HYDRODIURIL) 25 MG tablet Take 1 tablet by mouth daily starting 03/09/22 90 tablet 3   losartan (COZAAR) 100 MG tablet Take 1 tablet (100 mg total) by mouth daily. 90 tablet 3   metoprolol succinate (TOPROL-XL) 25 MG 24 hr tablet Take 1 tablet (25 mg total) by mouth 2 (two) times daily. 180 tablet 3   spironolactone (ALDACTONE) 25 MG tablet Take 1 tablet (25 mg total) by mouth daily. 90 tablet 3   No current facility-administered medications for this encounter.    Allergies:   Patient has no known allergies.   Social History:  The patient  reports that he has never smoked. He has never used  smokeless tobacco. He reports that he does not currently use alcohol. He reports that he does not use drugs.   Family History:  The patient's family history includes Autism in his son; Bladder Cancer in his father; Kidney failure in his mother; Multiple myeloma in his mother; Non-Hodgkin's lymphoma in his father.     PHYSICAL EXAM: VS:  BP 124/80   Pulse 74   Wt 90.4 kg (199 lb 6.4 oz)   SpO2 97%   BMI 35.32 kg/m    Wt Readings from Last 3 Encounters:  08/29/22 90.4 kg (199 lb 6.4 oz)  07/12/22 93 kg (205 lb)  05/16/22 92.7 kg (204 lb 6.4 oz)   General:  Well appearing. No resp difficulty HEENT: normal Neck: supple. no JVD. Carotids 2+ bilat; no bruits. No lymphadenopathy or thryomegaly appreciated. Cor: PMI nondisplaced. Regular rate & rhythm. No rubs, gallops or murmurs. Lungs: clear Abdomen: soft, nontender, nondistended. No hepatosplenomegaly. No bruits or masses. Good bowel sounds. Extremities: no cyanosis, clubbing, rash, edema Neuro: alert & orientedx3, cranial nerves grossly intact. moves all 4 extremities w/o difficulty. Affect pleasant   ASSESSMENT AND PLAN   1. Hypertrophic Obstructive Cardiomyopathy s/p septal myomectomy in January 2008 at [facility] - Doing very well. Asymptomatic. ETT in 12/15 with excellent exercise tolerance. No arrhythmias and good BP response to exercise. He has not had any high-risk features for SCD and does not meet cirteria ICD. - Echo 9/19: No obstruction. No SAM, mild MR, LA moderately dilated - Echo 11/21 EF 65-70% No SAM or outflow tract gradient  - Continue Toprol XL 100 mg bid and losartan 100mg  daily - Stable NYHA III. No LVOT gradient so not candidate for mavacamtan - We again discussed his work potential and I reiterated that he can return to work as long as he follows the limitation of rest breaks as needed and no lifting/pushing/pulling > 30 pounds  2. Hypertension:  - Blood pressure well controlled. Continue current regimen.  4.  Carotid stenosis with stroke like symptoms - s/p R CEA in 02/2018. Followed by [facility]  - Continue ASA/statin  5. CAD, non-obstructive - cardiac CT 12/23. non-obstructive CAD in LAD and LCX (25-49%) - Continue lipitor 80  - Off ASA due to Eliquis  6. 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

## 2022-08-29 NOTE — Addendum Note (Signed)
Encounter addended by: Linda Hedges, RN on: 08/29/2022 3:53 PM  Actions taken: Clinical Note Signed

## 2022-09-05 ENCOUNTER — Encounter: Payer: Self-pay | Admitting: Internal Medicine

## 2022-09-05 ENCOUNTER — Ambulatory Visit (AMBULATORY_SURGERY_CENTER): Payer: BC Managed Care – PPO | Admitting: Internal Medicine

## 2022-09-05 VITALS — BP 119/72 | HR 71 | Temp 97.8°F | Resp 18 | Ht 63.0 in | Wt 205.0 lb

## 2022-09-05 DIAGNOSIS — Z1211 Encounter for screening for malignant neoplasm of colon: Secondary | ICD-10-CM

## 2022-09-05 DIAGNOSIS — R1319 Other dysphagia: Secondary | ICD-10-CM

## 2022-09-05 DIAGNOSIS — K2970 Gastritis, unspecified, without bleeding: Secondary | ICD-10-CM

## 2022-09-05 DIAGNOSIS — D120 Benign neoplasm of cecum: Secondary | ICD-10-CM

## 2022-09-05 DIAGNOSIS — K319 Disease of stomach and duodenum, unspecified: Secondary | ICD-10-CM

## 2022-09-05 DIAGNOSIS — R1013 Epigastric pain: Secondary | ICD-10-CM

## 2022-09-05 MED ORDER — SODIUM CHLORIDE 0.9 % IV SOLN: 500.0000 mL | Freq: Once | INTRAVENOUS | Status: DC

## 2022-09-05 MED ORDER — PANTOPRAZOLE SODIUM 40 MG PO TBEC: DELAYED_RELEASE_TABLET | ORAL | 0 refills | Status: DC

## 2022-09-05 NOTE — Op Note (Signed)
[facility] Patient Name: Dregan Palubicki Procedure Date: 09/05/2022 2:11 PM MRN: [id number] Endoscopist: Beverley Fiedler , MD, [id number] Age: 62 Referring MD:  Date of Birth: [birth year] Gender: Male Account #: [id number] Procedure:                Upper GI endoscopy Indications:              Epigastric abdominal pain, Dysphagia Medicines:                Monitored Anesthesia Care Procedure:                Pre-Anesthesia Assessment:                           - Prior to the procedure, a History and Physical                            was performed, and patient medications and                            allergies were reviewed. The patient's tolerance of                            previous anesthesia was also reviewed. The risks                            and benefits of the procedure and the sedation                            options and risks were discussed with the patient.                            All questions were answered, and informed consent                            was obtained. Prior Anticoagulants: The patient has                            taken Eliquis (apixaban), last dose was 2 days                            prior to procedure. ASA Grade Assessment: III - A                            patient with severe systemic disease. After                            reviewing the risks and benefits, the patient was                            deemed in satisfactory condition to undergo the                            procedure.  After obtaining informed consent, the endoscope was                            passed under direct vision. Throughout the                            procedure, the patient's blood pressure, pulse, and                            oxygen saturations were monitored continuously. The                            GIF HQ190 #[id number] was introduced through the                            mouth, and advanced to the second part of  duodenum. Scope In: Scope Out: Findings:                 LA Grade C (one or more mucosal breaks continuous                            between tops of 2 or more mucosal folds, less than                            75% circumference) esophagitis with no bleeding was                            found in the distal esophagus.                           A 2 cm hiatal hernia was present.                           The gastroesophageal flap valve was visualized                            endoscopically and classified as Hill Grade IV (no                            fold, wide open lumen, hiatal hernia present).                           Diffuse mild inflammation characterized by erythema                            and granularity was found in the gastric fundus, in                            the gastric body and in the gastric antrum.                            Biopsies were taken with a cold forceps for  Helicobacter pylori testing.                           The examined duodenum was normal. Complications:            No immediate complications. Estimated Blood Loss:     Estimated blood loss was minimal. Impression:               - LA Grade C reflux esophagitis with no bleeding.                           - 2 cm hiatal hernia.                           - Gastritis. Biopsied.                           - Normal examined duodenum. Recommendation:           - Patient has a contact number available for                            emergencies. The signs and symptoms of potential                            delayed complications were discussed with the                            patient. Return to normal activities tomorrow.                            Written discharge instructions were provided to the                            patient.                           - Resume previous diet.                           - Continue present medications. Begin pantoprazole                             40 mg twice daily x 4 weeks and then once daily for                            esophagitis.                           - If persistent upper abdominal pain despite                            pantoprazole US gallbladder is recommended. Please                            contact my office in 4-8 weeks (call or mychart  email) to let me know if symptoms have improved (or                            not).                           - Await pathology results.                           - See the other procedure note for documentation of                            additional recommendations. Beverley Fiedler, MD 09/05/2022 2:40:23 PM This report has been signed electronically.

## 2022-09-05 NOTE — Patient Instructions (Addendum)
Handouts Provided:  Polyps and Gastritis  RESUME Eliquis at prior dose today.  Refer to managing physician for further adjustment therapy.  START taking pantoprazole 40 mg twice daily for 4 weeks and then once daily for esophagitis. Contact Dr. Lauro Franklin office in 4-8 weeks if symptoms have improved or not.   YOU HAD AN ENDOSCOPIC PROCEDURE TODAY AT THE [facility]:   Refer to the procedure report that was given to you for any specific questions about what was found during the examination.  If the procedure report does not answer your questions, please call your gastroenterologist to clarify.  If you requested that your care partner not be given the details of your procedure findings, then the procedure report has been included in a sealed envelope for you to review at your convenience later.  YOU SHOULD EXPECT: Some feelings of bloating in the abdomen. Passage of more gas than usual.  Walking can help get rid of the air that was put into your GI tract during the procedure and reduce the bloating. If you had a lower endoscopy (such as a colonoscopy or flexible sigmoidoscopy) you may notice spotting of blood in your stool or on the toilet paper. If you underwent a bowel prep for your procedure, you may not have a normal bowel movement for a few days.  Please Note:  You might notice some irritation and congestion in your nose or some drainage.  This is from the oxygen used during your procedure.  There is no need for concern and it should clear up in a day or so.  SYMPTOMS TO REPORT IMMEDIATELY:  Following lower endoscopy (colonoscopy or flexible sigmoidoscopy):  Excessive amounts of blood in the stool  Significant tenderness or worsening of abdominal pains  Swelling of the abdomen that is new, acute  Fever of 100F or higher  Following upper endoscopy (EGD)  Vomiting of blood or coffee ground material  New chest pain or pain under the shoulder blades  Painful or persistently  difficult swallowing  New shortness of breath  Fever of 100F or higher  Black, tarry-looking stools  For urgent or emergent issues, a gastroenterologist can be reached at any hour by calling [phone]. Do not use MyChart messaging for urgent concerns.    DIET:  We do recommend a small meal at first, but then you may proceed to your regular diet.  Drink plenty of fluids but you should avoid alcoholic beverages for 24 hours.  ACTIVITY:  You should plan to take it easy for the rest of today and you should NOT DRIVE or use heavy machinery until tomorrow (because of the sedation medicines used during the test).    FOLLOW UP: Our staff will call the number listed on your records the next business day following your procedure.  We will call around 7:15- 8:00 am to check on you and address any questions or concerns that you may have regarding the information given to you following your procedure. If we do not reach you, we will leave a message.     If any biopsies were taken you will be contacted by phone or by letter within the next 1-3 weeks.  Please call us at [phone] if you have not heard about the biopsies in 3 weeks.    SIGNATURES/CONFIDENTIALITY: You and/or your care partner have signed paperwork which will be entered into your electronic medical record.  These signatures attest to the fact that that the information above on your After Visit  Summary has been reviewed and is understood.  Full responsibility of the confidentiality of this discharge information lies with you and/or your care-partner.

## 2022-09-05 NOTE — Op Note (Signed)
[facility] Patient Name: [name] [name] Procedure Date: 09/05/2022 2:10 PM MRN: [id number] Endoscopist: Beverley Fiedler , MD, [id number] Age: 62 Referring MD:  Date of Birth: 07-Jun-1960 Gender: Male Account #: [id number] Procedure:                Colonoscopy Indications:              Screening for colorectal malignant neoplasm, Last                            colonoscopy: 2013 Medicines:                Monitored Anesthesia Care Procedure:                Pre-Anesthesia Assessment:                           - Prior to the procedure, a History and Physical                            was performed, and patient medications and                            allergies were reviewed. The patient's tolerance of                            previous anesthesia was also reviewed. The risks                            and benefits of the procedure and the sedation                            options and risks were discussed with the patient.                            All questions were answered, and informed consent                            was obtained. Prior Anticoagulants: The patient has                            taken Eliquis (apixaban), last dose was 2 days                            prior to procedure. ASA Grade Assessment: III - A                            patient with severe systemic disease. After                            reviewing the risks and benefits, the patient was                            deemed in satisfactory condition to undergo the  procedure.                           After obtaining informed consent, the colonoscope                            was passed under direct vision. Throughout the                            procedure, the patient's blood pressure, pulse, and                            oxygen saturations were monitored continuously. The                            Olympus [id number] ([id number]) Colonoscope was                             introduced through the anus and advanced to the                            cecum, identified by appendiceal orifice and                            ileocecal valve. The colonoscopy was performed                            without difficulty. The patient tolerated the                            procedure well. The quality of the bowel                            preparation was excellent. The ileocecal valve,                            appendiceal orifice, and rectum were photographed. Scope In: 2:24:05 PM Scope Out: 2:33:42 PM Scope Withdrawal Time: 0 hours 8 minutes 23 seconds  Total Procedure Duration: 0 hours 9 minutes 37 seconds  Findings:                 The digital rectal exam was normal.                           A 4 mm polyp was found in the cecum. The polyp was                            sessile. The polyp was removed with a cold snare.                            Resection and retrieval were complete.                           Multiple medium-mouthed and small-mouthed  diverticula were found in the sigmoid colon and                            descending colon.                           Internal hemorrhoids were found during                            retroflexion. The hemorrhoids were small. Complications:            No immediate complications. Estimated Blood Loss:     Estimated blood loss: none. Impression:               - One 4 mm polyp in the cecum, removed with a cold                            snare. Resected and retrieved.                           - Mild diverticulosis in the sigmoid colon and in                            the descending colon.                           - Internal hemorrhoids. Recommendation:           - Patient has a contact number available for                            emergencies. The signs and symptoms of potential                            delayed complications were discussed with the                             patient. Return to normal activities tomorrow.                            Written discharge instructions were provided to the                            patient.                           - Resume previous diet.                           - Continue present medications.                           - Resume Eliquis (apixaban) at prior dose today.                            Refer to managing physician for further adjustment  of therapy.                           - Await pathology results.                           - Repeat colonoscopy is recommended. The                            colonoscopy date will be determined after pathology                            results from today's exam become available for                            review. Beverley Fiedler, MD 09/05/2022 2:42:19 PM This report has been signed electronically.

## 2022-09-05 NOTE — Progress Notes (Signed)
GASTROENTEROLOGY PROCEDURE H&P NOTE   Primary Care Physician: No primary care provider on file.    Reason for Procedure:   Dysphagia, epigastric pain, crc screening, rectal bleeding  Plan:    EGD/colonoscopy  Patient is appropriate for endoscopic procedure(s) in the ambulatory ([facility]) setting.  The nature of the procedure, as well as the risks, benefits, and alternatives were carefully and thoroughly reviewed with the patient. Ample time for discussion and questions allowed. The patient understood, was satisfied, and agreed to proceed.     HPI: [name] [name] is a 62 y.o. male who presents for EGD with possible dilation and colonoscopy.  Medical history as below.  Tolerated the prep.  No recent chest pain or shortness of breath.  No abdominal pain today. Eliquis on hold x 2 days.  Past Medical History:  Diagnosis Date   Arthritis    "some in my hands" (02/25/2018)   Carotid artery stenosis, asymptomatic, right    Chest pain     -Lexiscan Myoview 3/12: ? small apical lateral ischemia, EF 53%; similar to study in 2/11   CHF (congestive heart failure) (HCC)    hx   Erectile dysfunction    GERD (gastroesophageal reflux disease)    Heart murmur    hx   HOCM (hypertrophic obstructive cardiomyopathy) (HCC)     --cardiac cath 2007. nomrmal cors;  s/p myomectomy 2008;  Echo 3/12: mod to severe LVH, HCM, EF 65%, resting LVOT peak 2.21 m/s, mild LAE, mild MR, AV mean 9;  Echo 12/13: severe LVH, EF 80%, Gr 2 diast dsyfn, mild LAE, mild RVE   HTN (hypertension)    Hyperlipidemia     Past Surgical History:  Procedure Laterality Date   CARDIAC CATHETERIZATION  04/14/2006   CARDIOVERSION N/A 03/06/2022   Procedure: CARDIOVERSION;  Surgeon: Dolores Patty, MD;  Location: [facility] ENDOSCOPY;  Service: Cardiovascular;  Laterality: N/A;   CAROTID ENDARTERECTOMY Right 02/25/2018   ENDARTERECTOMY Right 02/25/2018   Procedure: ENDARTERECTOMY CAROTID RIGHT;  Surgeon: Larina Earthly, MD;   Location: [facility] OR;  Service: Vascular;  Laterality: Right;   MYOMECTOMY  04/2006   at [facility]    TEE WITHOUT CARDIOVERSION N/A 03/06/2022   Procedure: TRANSESOPHAGEAL ECHOCARDIOGRAM (TEE);  Surgeon: Dolores Patty, MD;  Location:  [facility] ENDOSCOPY;  Service: Cardiovascular;  Laterality: N/A;    Prior to Admission medications   Medication Sig Start Date End Date Taking? Authorizing Provider  atorvastatin (LIPITOR) 80 MG tablet Take 1 tablet (80 mg total) by mouth daily. 05/16/22  Yes Bensimhon, Bevelyn Buckles, MD  hydrochlorothiazide (HYDRODIURIL) 25 MG tablet Take 1 tablet by mouth daily starting 03/09/22 03/28/22  Yes Bensimhon, Bevelyn Buckles, MD  losartan (COZAAR) 100 MG tablet Take 1 tablet (100 mg total) by mouth daily. 03/28/22  Yes Bensimhon, Bevelyn Buckles, MD  metoprolol succinate (TOPROL-XL) 25 MG 24 hr tablet Take 1 tablet (25 mg total) by mouth 2 (two) times daily. 03/28/22  Yes Bensimhon, Bevelyn Buckles, MD  spironolactone (ALDACTONE) 25 MG tablet Take 1 tablet (25 mg total) by mouth daily. 03/28/22  Yes Bensimhon, Bevelyn Buckles, MD  apixaban (ELIQUIS) 5 MG TABS tablet Take 1 tablet (5 mg total) by mouth 2 (two) times daily. 03/28/22   Bensimhon, Bevelyn Buckles, MD    Current Outpatient Medications  Medication Sig Dispense Refill   atorvastatin (LIPITOR) 80 MG tablet Take 1 tablet (80 mg total) by mouth daily. 90 tablet 3   hydrochlorothiazide (HYDRODIURIL) 25 MG tablet Take 1 tablet by mouth daily  starting 03/09/22 90 tablet 3   losartan (COZAAR) 100 MG tablet Take 1 tablet (100 mg total) by mouth daily. 90 tablet 3   metoprolol succinate (TOPROL-XL) 25 MG 24 hr tablet Take 1 tablet (25 mg total) by mouth 2 (two) times daily. 180 tablet 3   spironolactone (ALDACTONE) 25 MG tablet Take 1 tablet (25 mg total) by mouth daily. 90 tablet 3   apixaban (ELIQUIS) 5 MG TABS tablet Take 1 tablet (5 mg total) by mouth 2 (two) times daily. 180 tablet 3   Current Facility-Administered Medications  Medication Dose Route Frequency  Provider Last Rate Last Admin   0.9 %  sodium chloride infusion  500 mL Intravenous Once , Carie Caddy, MD        Allergies as of 09/05/2022   (No Known Allergies)    Family History  Problem Relation Age of Onset   Multiple myeloma Mother    Kidney failure Mother    Non-Hodgkin's lymphoma Father    Bladder Cancer Father    Autism Son    Stomach cancer Neg Hx    Colon cancer Neg Hx    Esophageal cancer Neg Hx     Social History   Socioeconomic History   Marital status: Married    Spouse name: Alice   Number of children: 5   Years of education: Not on file   Highest education level: 12th grade  Occupational History   Occupation: route Probation officer: [employer]   Occupation: route salesman  Tobacco Use   Smoking status: Never   Smokeless tobacco: Never  Vaping Use   Vaping Use: Never used  Substance and Sexual Activity   Alcohol use: Not Currently   Drug use: Never   Sexual activity: Yes  Other Topics Concern   Not on file  Social History Narrative   Patient is right-handed. He lives with his wife in a 2 story house. He drinks 2 cups of coffee a day, and until 2 weeks ago, he drank several Mt. Dew's a day. He is very active at work.   Social Determinants of Health   Financial Resource Strain: Low Risk  (03/07/2022)   Overall Financial Resource Strain (CARDIA)    Difficulty of Paying Living Expenses: Not hard at all  Food Insecurity: No Food Insecurity (03/07/2022)   Hunger Vital Sign    Worried About Running Out of Food in the Last Year: Never true    Ran Out of Food in the Last Year: Never true  Transportation Needs: No Transportation Needs (03/07/2022)   PRAPARE - Administrator, Civil Service (Medical): No    Lack of Transportation (Non-Medical): No  Physical Activity: Not on file  Stress: Not on file  Social Connections: Not on file  Intimate Partner Violence: Not At Risk (03/07/2022)   Humiliation, Afraid, Rape, and Kick questionnaire     Fear of Current or Ex-Partner: No    Emotionally Abused: No    Physically Abused: No    Sexually Abused: No    Physical Exam: Vital signs in last 24 hours: @BP  (!) 135/91   Pulse 73   Temp 97.8 F (36.6 C)   Ht 5\' 3"  (1.6 m)   Wt 205 lb (93 kg)   SpO2 98%   BMI 36.31 kg/m  GEN: NAD EYE: Sclerae anicteric ENT: MMM CV: Non-tachycardic Pulm: CTA b/l GI: Soft, NT/ND NEURO:  Alert & Oriented x 3   Erick Blinks, MD [facility]  09/05/2022 2:06 PM

## 2022-09-05 NOTE — Progress Notes (Signed)
Pt's states no medical or surgical changes since previsit or office visit. 

## 2022-09-06 ENCOUNTER — Telehealth: Payer: Self-pay | Admitting: *Deleted

## 2022-09-06 NOTE — Telephone Encounter (Signed)
No answer on  follow up call. Left message.   

## 2022-09-12 ENCOUNTER — Encounter: Payer: Self-pay | Admitting: Internal Medicine

## 2022-09-23 ENCOUNTER — Telehealth: Payer: Self-pay | Admitting: Internal Medicine

## 2022-09-23 NOTE — Telephone Encounter (Signed)
Inbound call from patient requesting to speak with a nurse, patient stated Pantoprazole is causing  him some constipation and he stop taking it , but want to know if its a normal side effect form med .Please advise

## 2022-09-24 ENCOUNTER — Other Ambulatory Visit: Payer: Self-pay

## 2022-09-24 MED ORDER — LANSOPRAZOLE 30 MG PO CPDR: 30.0000 mg | DELAYED_RELEASE_CAPSULE | Freq: Every day | ORAL | 3 refills | Status: DC

## 2022-09-24 NOTE — Telephone Encounter (Signed)
Spoke with pt and he is aware of Dr. Pyrtle's recommendations. 

## 2022-09-24 NOTE — Telephone Encounter (Signed)
See if lansoprazole 30 mg daily controls GERD symptoms without constipation

## 2022-09-24 NOTE — Telephone Encounter (Signed)
Pt states he was started on protonix after his ECL. Reports since he started taking protonix he has had some constipation. He stopped the protonix and says the constipation got better. He also states that he has started having BRB with every BM again. He did have internal hems on colon report. Pt wants to know if Dr. Terri Piedra wants to start him on a different medication. Please advise.

## 2022-12-08 NOTE — Progress Notes (Signed)
Patient ID: BUCHANAN HOUSEN, male   DOB: 07-15-1960, 62 y.o.   MRN: [id number]   Advanced [facility] Failure Clinic Note   Date:  12/09/2022   ID:  EVON TALARICO, DOB [birth year], MRN [id number]  PCP:  No primary care provider on file.  Cardiologist:  Dr. Arvilla Meres     History of Present Illness:  LYSANDER PENDLEY is a 63 y.o. male with a hx of HOCM, status post septal myomectomy in 04/2006 at [facility], mild mitral regurgitation, HTN, HL with a low HDL. (cardiac cath 2007 with normal coronaries) He was admitted to [facility] in 3/12 with syncope and atypical chest discomfort. He saw one of our cardiologists there. He had mildly elevated troponins. This was likely related to strain from his LVH. His CK-MB's were negative. Echo (06/2010): moderate to severe LVH and EF of 65%, resting LVOT peak velocity was calculated at 2.21 m/s. A Myoview study demonstrated a small, fixed defect in the apical septal segment. There was a small, reversible defect in the mid anterolateral, apical lateral segments. Findings were similar to a prior study in 05/2009. Mild apical lateral ischemia could not be ruled out and his EF was 53%.   F/u echo (03/2012):  Severe LVH, EF 80%, LVOT peak velocity 1.33 m/s, septal thickness 21 mm, Gr 2 DD, mild LAE.  He has been doing well since last seen. He denies chest pain, syncope, PND, edema, dyspnea.   Had ETT 04/04/14: 12:00 min [name] Protocol. 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  Admitted 11/23 for HF in setting of atrial flutter. Underwent DC-CV.   Echo EF 60-65%. No LVOT gradient   Home sleep study 11/23 AHI 3.8   Had EGD/colonoscopy in 5/24 found to have grade 3 reflux esophagitis. Started on PPI   Returns for f/u. Remains on short-term disability. Able to do ADLs without too much difficulty. But gets tired and winded easily with any more activity. Poor stamina. No CP, edema,  syncope op presyncope. Scr bumped from 1.2 -> 1.5 and PCP started [place] and referred to Nephrology.   Review of systems complete and found to be negative unless listed in HPI.   Recent Labs: 03/05/2022: HDL 27; LDL Cholesterol 70; TSH 1.677 03/07/2022: Hemoglobin 14.8 03/11/2022: B Natriuretic Peptide 111.2; Creatinine, Ser 1.09; Potassium 4.0  Wt Readings from Last 3 Encounters:  12/09/22 91.3 kg (201 lb 3.2 oz)  09/05/22 93 kg (205 lb)  08/29/22 90.4 kg (199 lb 6.4 oz)     Past Medical History:  Diagnosis Date   Arthritis    "some in my hands" (02/25/2018)   Carotid artery stenosis, asymptomatic, right    Chest pain     -Lexiscan Myoview 3/12: ? small apical lateral ischemia, EF 53%; similar to study in 2/11   CHF (congestive heart failure) (HCC)    hx   Erectile dysfunction    GERD (gastroesophageal reflux disease)    Heart murmur    hx   HOCM (hypertrophic obstructive cardiomyopathy) (HCC)     --cardiac cath 2007. nomrmal cors;  s/p myomectomy 2008;  Echo 3/12: mod to severe LVH, HCM, EF 65%, resting LVOT peak 2.21 m/s, mild LAE, mild MR, AV mean 9;  Echo 12/13: severe LVH, EF 80%, Gr 2 diast dsyfn, mild LAE, mild RVE   HTN (hypertension)    Hyperlipidemia     Current Outpatient Medications  Medication Sig Dispense  Refill   apixaban (ELIQUIS) 5 MG TABS tablet Take 1 tablet (5 mg total) by mouth 2 (two) times daily. 180 tablet 3   atorvastatin (LIPITOR) 80 MG tablet Take 1 tablet (80 mg total) by mouth daily. 90 tablet 3   dapagliflozin propanediol (FARXIGA) 10 MG TABS tablet Take 10 mg by mouth daily.     hydrochlorothiazide (HYDRODIURIL) 25 MG tablet Take 1 tablet by mouth daily starting 03/09/22 90 tablet 3   lansoprazole (PREVACID) 30 MG capsule Take 30 mg by mouth.     losartan (COZAAR) 100 MG tablet Take 1 tablet (100 mg total) by mouth daily. 90 tablet 3   metoprolol succinate (TOPROL-XL) 25 MG 24 hr tablet Take 1 tablet (25 mg total) by mouth 2 (two) times daily.  180 tablet 3   spironolactone (ALDACTONE) 25 MG tablet Take 1 tablet (25 mg total) by mouth daily. 90 tablet 3   No current facility-administered medications for this encounter.    Allergies:   Patient has no known allergies.   Social History:  The patient  reports that he has never smoked. He has never used smokeless tobacco. He reports that he does not currently use alcohol. He reports that he does not use drugs.   Family History:  The patient's family history includes Autism in his son; Bladder Cancer in his father; Kidney failure in his mother; Multiple myeloma in his mother; Non-Hodgkin's lymphoma in his father.     PHYSICAL EXAM: VS:  BP 120/80   Pulse 63   Wt 91.3 kg (201 lb 3.2 oz)   SpO2 97%   BMI 35.64 kg/m    Wt Readings from Last 3 Encounters:  12/09/22 91.3 kg (201 lb 3.2 oz)  09/05/22 93 kg (205 lb)  08/29/22 90.4 kg (199 lb 6.4 oz)   General:  Well appearing. No resp difficulty HEENT: normal Neck: supple. no JVD. Carotids 2+ bilat; no bruits. No lymphadenopathy or thryomegaly appreciated. Cor: PMI nondisplaced. Regular rate & rhythm. No rubs, gallops or murmurs. Lungs: clear Abdomen: soft, nontender, nondistended. No hepatosplenomegaly. No bruits or masses. Good bowel sounds. Extremities: no cyanosis, clubbing, rash, edema Neuro: alert & orientedx3, cranial nerves grossly intact. moves all 4 extremities w/o difficulty. Affect pleasant   ECG: SR 62 with 1AVB ( ) + LVH Personally reviewed   ASSESSMENT AND PLAN   1. Hypertrophic Obstructive Cardiomyopathy s/p septal myomectomy in January 2008 at [facility] - Doing very well. Asymptomatic. ETT in 12/15 with excellent exercise tolerance. No arrhythmias and good BP response to exercise. He has not had any high-risk features for SCD and does not meet cirteria ICD. - Echo 9/19: No obstruction. No SAM, mild MR, LA moderately dilated - Echo 11/21 EF 65-70% No SAM or outflow tract gradient  - Continue Toprol XL 100 mg  bid and losartan 100mg  daily - Stable NYHA III No LVOT gradient so not candidate for mavacamtan - Remains on short term disability due to NYHA III symptoms - [facility] and try to build back some stamina slowly   2. Hypertension:  - Blood pressure well controlled. Continue current regimen.  4. Carotid stenosis with stroke like symptoms - s/p R CEA in 02/2018. Followed by [facility]  - Continue ASA/statin  5. CAD, non-obstructive - cardiac CT 12/23. 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 CKD 3a - recent SCr bump 1.2->1.5 - followed by PCP and Nephrology -Continue Marcelline Deist. Stop HCTZ  Arvilla Meres, MD 10:15 AM

## 2022-12-09 ENCOUNTER — Encounter (HOSPITAL_COMMUNITY): Payer: Self-pay | Admitting: Internal Medicine

## 2022-12-09 ENCOUNTER — Ambulatory Visit (HOSPITAL_COMMUNITY)
Admission: RE | Admit: 2022-12-09 | Discharge: 2022-12-09 | Disposition: A | Discharge status: Home / Self Care | Payer: BC Managed Care – PPO | Source: Ambulatory Visit | Attending: Internal Medicine | Admitting: Internal Medicine

## 2022-12-09 VITALS — BP 120/80 | HR 63 | Wt 201.2 lb

## 2022-12-09 DIAGNOSIS — I440 Atrioventricular block, first degree: Secondary | ICD-10-CM | POA: Diagnosis not present

## 2022-12-09 DIAGNOSIS — N179 Acute kidney failure, unspecified: Secondary | ICD-10-CM

## 2022-12-09 DIAGNOSIS — R9431 Abnormal electrocardiogram [ECG] [EKG]: Secondary | ICD-10-CM | POA: Diagnosis present

## 2022-12-09 DIAGNOSIS — I130 Hypertensive heart and chronic kidney disease with heart failure and stage 1 through stage 4 chronic kidney disease, or unspecified chronic kidney disease: Secondary | ICD-10-CM | POA: Diagnosis not present

## 2022-12-09 DIAGNOSIS — I421 Obstructive hypertrophic cardiomyopathy: Secondary | ICD-10-CM

## 2022-12-09 DIAGNOSIS — I509 Heart failure, unspecified: Secondary | ICD-10-CM | POA: Diagnosis not present

## 2022-12-09 DIAGNOSIS — R079 Chest pain, unspecified: Secondary | ICD-10-CM | POA: Diagnosis not present

## 2022-12-09 DIAGNOSIS — I6521 Occlusion and stenosis of right carotid artery: Secondary | ICD-10-CM | POA: Diagnosis not present

## 2022-12-09 DIAGNOSIS — I480 Paroxysmal atrial fibrillation: Secondary | ICD-10-CM

## 2022-12-09 DIAGNOSIS — I2510 Atherosclerotic heart disease of native coronary artery without angina pectoris: Secondary | ICD-10-CM | POA: Diagnosis not present

## 2022-12-09 DIAGNOSIS — N1831 Chronic kidney disease, stage 3a: Secondary | ICD-10-CM | POA: Insufficient documentation

## 2022-12-09 DIAGNOSIS — I5032 Chronic diastolic (congestive) heart failure: Secondary | ICD-10-CM | POA: Diagnosis not present

## 2022-12-09 DIAGNOSIS — Z7901 Long term (current) use of anticoagulants: Secondary | ICD-10-CM | POA: Insufficient documentation

## 2022-12-09 DIAGNOSIS — R5381 Other malaise: Secondary | ICD-10-CM | POA: Diagnosis present

## 2022-12-09 NOTE — Patient Instructions (Addendum)
STOP HTCZ.   CONTINUE HEART FAILURE RESTRICTIONS.  [facility].  Your physician recommends that you schedule a follow-up appointment in: 6 MONTHS ( February 2025)  ** PLEASE CALL THE OFFICE IN NOVEMBER TO ARRANGE YOUR FOLLOW UP APPOINTMENT. **  If you have any questions or concerns before your next appointment please send [place] a message through [place] or call our office at [phone].    TO LEAVE A MESSAGE FOR THE NURSE SELECT OPTION 2, PLEASE LEAVE A MESSAGE INCLUDING: YOUR NAME DATE OF BIRTH CALL BACK NUMBER REASON FOR CALL**this is important as we prioritize the call backs  YOU WILL RECEIVE A CALL BACK THE SAME DAY AS LONG AS YOU CALL BEFORE 4:00 PM  At the [facility], you and your health needs are our priority. As part of our continuing mission to provide you with exceptional heart care, we have created designated Provider Care Teams. These Care Teams include your primary Cardiologist (physician) and Advanced Practice Providers (APPs- Physician Assistants and Nurse Practitioners) who all work together to provide you with the care you need, when you need it.   You may see any of the following providers on your designated Care Team at your next follow up: Dr Arvilla Meres Dr Marca Ancona Dr. Marcos Eke, NP Robbie Lis, [place] [facility] [place], [place] Brynda Peon, NP Karle Plumber, PharmD   Please be sure to bring in all your medications bottles to every appointment.    Thank you for choosing [facility]

## 2022-12-20 ENCOUNTER — Other Ambulatory Visit (HOSPITAL_COMMUNITY): Payer: Self-pay

## 2022-12-27 ENCOUNTER — Encounter (HOSPITAL_COMMUNITY): Payer: Self-pay | Admitting: *Deleted

## 2022-12-27 NOTE — Progress Notes (Signed)
Pt dropped of "PHysical Capacities Evaluation" form, Dr Leory Plowman completed and signed form, form returned to [employer] via mail in attached prepaid envelope

## 2023-01-21 ENCOUNTER — Other Ambulatory Visit: Payer: Self-pay | Admitting: Internal Medicine

## 2023-03-10 ENCOUNTER — Other Ambulatory Visit (HOSPITAL_COMMUNITY): Payer: Self-pay | Admitting: Internal Medicine

## 2023-04-21 ENCOUNTER — Other Ambulatory Visit (HOSPITAL_COMMUNITY): Payer: Self-pay | Admitting: Internal Medicine

## 2023-04-26 ENCOUNTER — Other Ambulatory Visit: Payer: Self-pay | Admitting: Internal Medicine

## 2023-05-02 ENCOUNTER — Ambulatory Visit (HOSPITAL_COMMUNITY)
Admission: RE | Admit: 2023-05-02 | Discharge: 2023-05-02 | Disposition: A | Discharge status: Home / Self Care | Payer: BC Managed Care – PPO | Source: Ambulatory Visit | Attending: Internal Medicine | Admitting: Internal Medicine

## 2023-05-02 ENCOUNTER — Encounter (HOSPITAL_COMMUNITY): Payer: Self-pay | Admitting: Internal Medicine

## 2023-05-02 VITALS — BP 140/80 | HR 60 | Wt 207.4 lb

## 2023-05-02 DIAGNOSIS — R55 Syncope and collapse: Secondary | ICD-10-CM | POA: Insufficient documentation

## 2023-05-02 DIAGNOSIS — G4733 Obstructive sleep apnea (adult) (pediatric): Secondary | ICD-10-CM | POA: Insufficient documentation

## 2023-05-02 DIAGNOSIS — I10 Essential (primary) hypertension: Secondary | ICD-10-CM

## 2023-05-02 DIAGNOSIS — I130 Hypertensive heart and chronic kidney disease with heart failure and stage 1 through stage 4 chronic kidney disease, or unspecified chronic kidney disease: Secondary | ICD-10-CM | POA: Diagnosis not present

## 2023-05-02 DIAGNOSIS — Z7182 Exercise counseling: Secondary | ICD-10-CM | POA: Diagnosis not present

## 2023-05-02 DIAGNOSIS — Z8673 Personal history of transient ischemic attack (TIA), and cerebral infarction without residual deficits: Secondary | ICD-10-CM | POA: Insufficient documentation

## 2023-05-02 DIAGNOSIS — I5032 Chronic diastolic (congestive) heart failure: Secondary | ICD-10-CM

## 2023-05-02 DIAGNOSIS — N1831 Chronic kidney disease, stage 3a: Secondary | ICD-10-CM | POA: Diagnosis not present

## 2023-05-02 DIAGNOSIS — I421 Obstructive hypertrophic cardiomyopathy: Secondary | ICD-10-CM | POA: Diagnosis not present

## 2023-05-02 DIAGNOSIS — I509 Heart failure, unspecified: Secondary | ICD-10-CM | POA: Diagnosis not present

## 2023-05-02 DIAGNOSIS — Z7901 Long term (current) use of anticoagulants: Secondary | ICD-10-CM | POA: Insufficient documentation

## 2023-05-02 DIAGNOSIS — Z79899 Other long term (current) drug therapy: Secondary | ICD-10-CM | POA: Insufficient documentation

## 2023-05-02 DIAGNOSIS — R0789 Other chest pain: Secondary | ICD-10-CM | POA: Diagnosis not present

## 2023-05-02 DIAGNOSIS — I2510 Atherosclerotic heart disease of native coronary artery without angina pectoris: Secondary | ICD-10-CM | POA: Insufficient documentation

## 2023-05-02 NOTE — Patient Instructions (Signed)
 Medication Changes:  No Changes In Medications at this time.   Testing/Procedures:  Your physician has requested that you have an echocardiogram. Echocardiography is a painless test that uses sound waves to create images of your heart. It provides your doctor with information about the size and shape of your heart and how well your heart's chambers and valves are working. This procedure takes approximately one hour. There are no restrictions for this procedure. Please do NOT wear cologne, perfume, aftershave, or lotions (deodorant is allowed). Please arrive 15 minutes prior to your appointment time.  Please note: We ask at that you not bring children with you during ultrasound (echo/ vascular) testing. Due to room size and safety concerns, children are not allowed in the ultrasound rooms during exams. Our front office staff cannot provide observation of children in our lobby area while testing is being conducted. An adult accompanying a patient to their appointment will only be allowed in the ultrasound room at the discretion of the ultrasound technician under special circumstances. We apologize for any inconvenience.  Follow-Up in: 12 months PLEASE CALL OUR OFFICE AROUND November 2025 TO GET SCHEDULED FOR YOUR APPOINTMENT. PHONE NUMBER IS [phone] OPTION 2   At the [facility], you and your health needs are our priority. We have a designated team specialized in the treatment of Heart Failure. This Care Team includes your primary Heart Failure Specialized Cardiologist (physician), Advanced Practice Providers (APPs- Physician Assistants and Nurse Practitioners), and Pharmacist who all work together to provide you with the care you need, when you need it.   You may see any of the following providers on your designated Care Team at your next follow up:  Dr. Toribio Fuel Dr. Ezra Shuck Dr. Ria Commander Dr. Odis Brownie Greig Mosses, NP Caffie Shed, [place] [facility] [place], [place] Beckey Coe, NP Jordan Lee, NP Tinnie Redman, PharmD   Please be sure to bring in all your medications bottles to every appointment.   Need to Contact Us :  If you have any questions or concerns before your next appointment please send us  a message through [place] or call our office at [phone].    TO LEAVE A MESSAGE FOR THE NURSE SELECT OPTION 2, PLEASE LEAVE A MESSAGE INCLUDING: YOUR NAME DATE OF BIRTH CALL BACK NUMBER REASON FOR CALL**this is important as we prioritize the call backs  YOU WILL RECEIVE A CALL BACK THE SAME DAY AS LONG AS YOU CALL BEFORE 4:00 PM

## 2023-05-02 NOTE — Addendum Note (Signed)
 Encounter addended by: Dolores Patty, MD on: 05/02/2023 10:21 AM  Actions taken: Clinical Note Signed

## 2023-05-02 NOTE — Progress Notes (Addendum)
 Patient ID: [name] [name], male   DOB: Nov 27, 1960, 63 y.o.   MRN: [id number]   Advanced [facility] Failure Clinic Note   Date:  05/02/2023   ID:  [name] [name], DOB Jan 10, 1961, MRN [id number]  PCP:  Allean Curry, NP  Cardiologist:  Dr. Toribio Fuel    CC: HOCM follow up   History of Present Illness:  [name] [name] is a 63 y.o. male with a hx of HOCM, status post septal myomectomy in 04/2006 at [facility], mild mitral regurgitation, HTN, HL with a low HDL. (cardiac cath 2007 with normal coronaries) He was admitted to [facility] in 3/12 with syncope and atypical chest discomfort. He saw one of our cardiologists there. He had mildly elevated troponins. This was likely related to strain from his LVH. His CK-MB's were negative. Echo (06/2010): moderate to severe LVH and EF of 65%, resting LVOT peak velocity was calculated at 2.21 m/s. A Myoview study demonstrated a small, fixed defect in the apical septal segment. There was a small, reversible defect in the mid anterolateral, apical lateral segments. Findings were similar to a prior study in 05/2009. Mild apical lateral ischemia could not be ruled out and his EF was 53%.   F/u echo (03/2012):  Severe LVH, EF 80%, LVOT peak velocity 1.33 m/s, septal thickness 21 mm, Gr 2 DD, mild LAE.  He has been doing well since last seen. He denies chest pain, syncope, PND, edema, dyspnea.   Had ETT 04/04/14: 12:00 min [name] Protocol. 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  Admitted 11/23 for HF in setting of atrial flutter. Underwent DC-CV.   Echo EF 60-65%. No LVOT gradient   Home sleep study 11/23 AHI 3.8   Had EGD/colonoscopy in 5/24 found to have grade 3 reflux esophagitis. Started on PPI   Follow up 8/24, stable NYHA III, encouraged to increase physical activity. Remained on disability.  Returns for f/u. Remains on short-term disability. Able to do ADLs without  too much difficulty. But gets tired and winded easily with any more activity. Poor stamina. No CP, edema, syncope op presyncope. Scr bumped from 1.2 -> 1.5 and PCP started Farxiga and referred to Nephrology.   Today he returns for HF follow up. Overall feeling pretty good. He is no longer working. Gets fatigued in the evenings, feels weak. He is not SOB walking on flat ground, can do ADLs.  [place] dysp[place]. Has occasional dizziness with exertion (mopping the floor), no falls. Denies palpitations, CP, edema, or PND/Orthopnea. Appetite ok. No fever or chills. Weight at home 201-203 pounds. Taking all medications. Has bleeding with BMs, following with GI and recent colonoscopy/EGD. Had labs at PCP 04/30/23.  Review of systems complete and found to be negative unless listed in HPI.   Recent Labs: No results found for requested labs within last 365 days.  Wt Readings from Last 3 Encounters:  12/09/22 91.3 kg (201 lb 3.2 oz)  09/05/22 93 kg (205 lb)  08/29/22 90.4 kg (199 lb 6.4 oz)     Past Medical History:  Diagnosis Date   Arthritis    some in my hands (02/25/2018)   Carotid artery stenosis, asymptomatic, right    Chest pain     -Lexiscan Myoview 3/12: ? small apical lateral ischemia, EF 53%; similar to study in 2/11   CHF (congestive heart failure) (HCC)  hx   Erectile dysfunction    GERD (gastroesophageal reflux disease)    Heart murmur    hx   HOCM (hypertrophic obstructive cardiomyopathy) (HCC)     --cardiac cath 2007. nomrmal cors;  s/p myomectomy 2008;  Echo 3/12: mod to severe LVH, HCM, EF 65%, resting LVOT peak 2.21 m/s, mild LAE, mild MR, AV mean 9;  Echo 12/13: severe LVH, EF 80%, Gr 2 diast dsyfn, mild LAE, mild RVE   HTN (hypertension)    Hyperlipidemia     Current Outpatient Medications  Medication Sig Dispense Refill   apixaban  (ELIQUIS ) 5 MG TABS tablet Take 1 tablet (5 mg total) by mouth 2 (two) times daily. 180  tablet 3   atorvastatin  (LIPITOR) 80 MG tablet TAKE 1 TABLET DAILY (CHANGE IN DOSE) 90 tablet 3   dapagliflozin propanediol (FARXIGA) 10 MG TABS tablet Take 10 mg by mouth daily.     lansoprazole  (PREVACID ) 30 MG capsule TAKE 1 CAPSULE BY MOUTH EVERY DAY 30 capsule 0   losartan  (COZAAR ) 100 MG tablet TAKE 1 TABLET DAILY 90 tablet 3   metoprolol  succinate (TOPROL -XL) 25 MG 24 hr tablet TAKE 1 TABLET TWICE A DAY 180 tablet 3   spironolactone  (ALDACTONE ) 25 MG tablet TAKE 1 TABLET DAILY 90 tablet 3   No current facility-administered medications for this encounter.    Allergies:   Patient has no known allergies.   Social History:  The patient  reports that he has never smoked. He has never used smokeless tobacco. He reports that he does not currently use alcohol. He reports that he does not use drugs.   Family History:  The patient's family history includes Autism in his son; Bladder Cancer in his father; Kidney failure in his mother; Multiple myeloma in his mother; Non-Hodgkin's lymphoma in his father.   PHYSICAL EXAM: General:  NAD. No resp difficulty, walked into clinic HEENT: Normal Neck: Supple. No JVD, thick neck. Carotids 2+ bilat; no bruits. No lymphadenopathy or thryomegaly appreciated. Cor: PMI nondisplaced. Regular rate & rhythm. No rubs, gallops or murmurs. Lungs: Clear Abdomen: Soft, obese, nontender, nondistended. No hepatosplenomegaly. No bruits or masses. Good bowel sounds. Extremities: No cyanosis, clubbing, rash, edema Neuro: Alert & oriented x 3, cranial nerves grossly intact. Moves all 4 extremities w/o difficulty. Affect pleasant.   ECG (personally reviewed): SR 1AVB, PR 272 msec, 62 bpm  ASSESSMENT AND PLAN  1. Hypertrophic Obstructive Cardiomyopathy s/p septal myomectomy in January 2008 at [facility] - Doing very well. Asymptomatic. ETT in 12/15 with excellent exercise tolerance. No arrhythmias and good BP response to exercise. He has not had any high-risk features for  SCD and does not meet cirteria ICD. - Echo 9/19: No obstruction. No SAM, mild MR, LA moderately dilated - Echo 11/21 EF 65-70% No SAM or outflow tract gradient  - Stable NYHA IIb-III, fatigue major limiter. No LVOT gradient so not candidate for mavacamtan - Remains on short term disability due to NYHA III symptoms - Continue Toprol  XL 100 mg bid  - Continue losartan  100 mg daily - Continue Fariga 10 mg daily - [facility] and try to build back some stamina slowly  - Had labs 04/30/23 at PCP, will request labs  2. Hypertension:  - Blood pressure up in clinic but has not had AM meds - Continue current regimen.  4. Carotid stenosis with stroke like symptoms - s/p R CEA in 02/2018.  - Followed by [facility]  - Continue statin - Off ASA  with need for [facility]  5. CAD, non-obstructive - cardiac CT 12/23. 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 CKD 3a - recent SCr bump 1.2->1.5 - Followed by PCP and Nephrology - Continue Farxiga.   Harlene Gainer NP  Patient seen and examined with the above-signed Advanced Practice Provider and/or Housestaff. I personally reviewed laboratory data, imaging studies and relevant notes. I independently examined the patient and formulated the important aspects of the plan. I have edited the note to reflect any of my changes or salient points. I have personally discussed the plan with the patient and/or family.  63 y/o male with HTN, HCM s/p myectomy, PAFL here for routine f/u.   Now on disability. Feels pretty good but limited by fatigue. No edema, orthopnea or PND. + Fatigue. No angina   Recent w/u for LGIB was negative. Now back on Eliquis    General:  Well appearing. No resp difficulty HEENT: normal Neck: supple. no JVD. Carotids 2+ bilat; no bruits. No lymphadenopathy or thryomegaly  appreciated. Cor: PMI nondisplaced. Regular rate & rhythm. No rubs, gallops or murmurs. Lungs: clear Abdomen: soft, nontender, nondistended. No hepatosplenomegaly. No bruits or masses. Good bowel sounds. Extremities: no cyanosis, clubbing, rash, edema Neuro: alert & orientedx3, cranial nerves grossly intact. moves all 4 extremities w/o difficulty. Affect pleasant   Overall stable diastolic HF. NYHA II-III. Volume ok. He is due for repeat echo. Will schedule. Lipids followed by PCP. BP up but hasn't taken meds yet this am.   Toribio Fuel, MD  10:17 AM

## 2023-05-02 NOTE — Addendum Note (Signed)
 Encounter addended by: Baird Cancer, RN on: 05/02/2023 10:26 AM  Actions taken: Clinical Note Signed, Order list changed, Diagnosis association updated

## 2023-05-19 ENCOUNTER — Ambulatory Visit (HOSPITAL_COMMUNITY)
Admission: RE | Admit: 2023-05-19 | Discharge: 2023-05-19 | Disposition: A | Discharge status: Home / Self Care | Payer: BC Managed Care – PPO | Source: Ambulatory Visit | Attending: Internal Medicine | Admitting: Internal Medicine

## 2023-05-19 DIAGNOSIS — I110 Hypertensive heart disease with heart failure: Secondary | ICD-10-CM | POA: Diagnosis not present

## 2023-05-19 DIAGNOSIS — I5032 Chronic diastolic (congestive) heart failure: Secondary | ICD-10-CM | POA: Diagnosis present

## 2023-05-19 DIAGNOSIS — E785 Hyperlipidemia, unspecified: Secondary | ICD-10-CM | POA: Diagnosis not present

## 2023-05-19 LAB — ECHOCARDIOGRAM COMPLETE
Area-P 1/2: 4.33 cm2
S' Lateral: 2.8 cm

## 2023-05-19 MED ORDER — PERFLUTREN LIPID MICROSPHERE
1.0000 mL | INTRAVENOUS | Status: AC | PRN
  Administered 2023-05-19: 6 mL via INTRAVENOUS

## 2023-05-22 ENCOUNTER — Other Ambulatory Visit: Payer: Self-pay | Admitting: Internal Medicine

## 2023-08-26 ENCOUNTER — Other Ambulatory Visit: Payer: Self-pay | Admitting: Internal Medicine

## 2023-10-21 ENCOUNTER — Encounter (HOSPITAL_COMMUNITY): Payer: Self-pay | Admitting: *Deleted

## 2023-10-21 NOTE — Progress Notes (Signed)
 Received fax from [place] along with signed ROI req records from 05/03/23 to present, completed, signed by Dr Cherrie, and faxed with requested records to [phone]

## 2023-11-16 ENCOUNTER — Other Ambulatory Visit: Payer: Self-pay | Admitting: Internal Medicine

## 2023-12-24 ENCOUNTER — Other Ambulatory Visit: Payer: Self-pay | Admitting: Internal Medicine

## 2024-01-24 ENCOUNTER — Other Ambulatory Visit: Payer: Self-pay | Admitting: Internal Medicine

## 2024-02-16 ENCOUNTER — Other Ambulatory Visit (HOSPITAL_COMMUNITY): Payer: Self-pay

## 2024-02-16 MED ORDER — SPIRONOLACTONE 25 MG PO TABS: 25.0000 mg | ORAL_TABLET | Freq: Every day | ORAL | 3 refills | Status: DC

## 2024-02-16 MED ORDER — METOPROLOL SUCCINATE ER 25 MG PO TB24: 25.0000 mg | ORAL_TABLET | Freq: Two times a day (BID) | ORAL | 3 refills | Status: DC

## 2024-02-19 ENCOUNTER — Other Ambulatory Visit: Payer: Self-pay | Admitting: Internal Medicine

## 2024-03-24 ENCOUNTER — Telehealth (HOSPITAL_COMMUNITY): Payer: Self-pay

## 2024-03-24 NOTE — Telephone Encounter (Signed)
 Called to confirm/remind patient of their appointment at the [facility] Clinic on 03/25/24 3:30.   Appointment:   [x] Confirmed  [] Left mess   [] No answer/No voice mail  [] VM Full/unable to leave message  [] Phone not in service  Patient reminded to bring all medications and/or complete list.  Confirmed patient has transportation. Gave directions, instructed to utilize valet parking.

## 2024-03-25 ENCOUNTER — Encounter (HOSPITAL_COMMUNITY): Payer: Self-pay

## 2024-03-25 ENCOUNTER — Ambulatory Visit (HOSPITAL_COMMUNITY): Admission: RE | Admit: 2024-03-25 | Discharge: 2024-03-25 | Attending: Internal Medicine

## 2024-03-25 ENCOUNTER — Other Ambulatory Visit: Payer: Self-pay | Admitting: Internal Medicine

## 2024-03-25 VITALS — BP 134/86 | HR 76 | Ht 63.0 in | Wt 209.4 lb

## 2024-03-25 DIAGNOSIS — I421 Obstructive hypertrophic cardiomyopathy: Secondary | ICD-10-CM

## 2024-03-25 DIAGNOSIS — I2510 Atherosclerotic heart disease of native coronary artery without angina pectoris: Secondary | ICD-10-CM

## 2024-03-25 DIAGNOSIS — I10 Essential (primary) hypertension: Secondary | ICD-10-CM

## 2024-03-25 DIAGNOSIS — I6529 Occlusion and stenosis of unspecified carotid artery: Secondary | ICD-10-CM

## 2024-03-25 DIAGNOSIS — I480 Paroxysmal atrial fibrillation: Secondary | ICD-10-CM | POA: Diagnosis not present

## 2024-03-25 DIAGNOSIS — N1831 Chronic kidney disease, stage 3a: Secondary | ICD-10-CM

## 2024-03-25 DIAGNOSIS — R5383 Other fatigue: Secondary | ICD-10-CM

## 2024-03-25 MED ORDER — DAPAGLIFLOZIN PROPANEDIOL 10 MG PO TABS: 10.0000 mg | ORAL_TABLET | Freq: Every day | ORAL | 5 refills | Status: AC

## 2024-03-25 MED ORDER — METOPROLOL SUCCINATE ER 25 MG PO TB24: 25.0000 mg | ORAL_TABLET | Freq: Two times a day (BID) | ORAL | 3 refills | Status: AC

## 2024-03-25 MED ORDER — SPIRONOLACTONE 25 MG PO TABS: 25.0000 mg | ORAL_TABLET | Freq: Every day | ORAL | 3 refills | Status: AC

## 2024-03-25 MED ORDER — LOSARTAN POTASSIUM 100 MG PO TABS: 100.0000 mg | ORAL_TABLET | Freq: Every day | ORAL | 3 refills | Status: AC

## 2024-03-25 MED ORDER — ATORVASTATIN CALCIUM 80 MG PO TABS: 80.0000 mg | ORAL_TABLET | Freq: Every day | ORAL | 3 refills | Status: AC

## 2024-03-25 MED ORDER — APIXABAN 5 MG PO TABS: 5.0000 mg | ORAL_TABLET | Freq: Two times a day (BID) | ORAL | 3 refills | Status: AC

## 2024-03-25 NOTE — Progress Notes (Signed)
 Patient declined labs today-- due to insurance plan only using [facility]. Patient will have these labs drawn at PCP in January. List of labs ordered was given to patient, with fax number to the office to have these sent over. Patient aware of instructions and verbalized understanding.

## 2024-03-25 NOTE — Progress Notes (Addendum)
 Patient ID: [name] [name], male   DOB: 12-12-1960, 63 y.o.   MRN: [id number]   Advanced Heart Failure Clinic Note   Date:  03/25/2024   ID:  [name] [name], DOB Sep 02, 1960, MRN [id number]  PCP:  [name] Curry, NP  Cardiologist:  Dr. Toribio Fuel    CC: HOCM follow up   History of Present Illness: [name] [name] is a 63 y.o. male with a hx of HOCM, status post septal myomectomy in 04/2006 at [facility], mild mitral regurgitation, HTN, HL with a low HDL. (cardiac cath 2007 with normal coronaries) He was admitted to [facility] in 3/12 with syncope and atypical chest discomfort. He saw one of our cardiologists there. He had mildly elevated troponins. This was likely related to strain from his LVH. His CK-MB's were negative. Echo (06/2010): moderate to severe LVH and EF of 65%, resting LVOT peak velocity was calculated at 2.21 m/s. A Myoview study demonstrated a small, fixed defect in the apical septal segment. There was a small, reversible defect in the mid anterolateral, apical lateral segments. Findings were similar to a prior study in 05/2009. Mild apical lateral ischemia could not be ruled out and his EF was 53%.   F/u echo (03/2012):  Severe LVH, EF 80%, LVOT peak velocity 1.33 m/s, septal thickness 21 mm, Gr 2 DD, mild LAE.  He has been doing well since last seen. He denies chest pain, syncope, PND, edema, dyspnea.   Had ETT 04/04/14: 12:00 min [name] Protocol. 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  Admitted 11/23 for HF in setting of atrial flutter. Underwent DC-CV.   Echo EF 60-65%. No LVOT gradient   Home sleep study 11/23 AHI 3.8   Had EGD/colonoscopy in 5/24 found to have grade 3 reflux esophagitis. Started on PPI   Follow up 8/24, stable NYHA III, encouraged to increase physical activity. Remained on disability.  Today he returns for AHF follow up. Overall feeling alright. Denies  palpitations, CP, dizziness, edema, or PND/Orthopnea. SOB with incline or stops or exertion. Appetite ok, avoids salty foods. No fever or chills. Not weighting at home. Taking all medications. Denies ETOH, tobacco or drug use. Drinks ~32 oz day. Stays active by doing work around the house. Now off disability and retired. Has been complaining to his wife about recent fatigue, also states its been ongoing for about a year.   Review of systems complete and found to be negative unless listed in HPI.   Recent Labs: No results found for requested labs within last 365 days.  Wt Readings from Last 3 Encounters:  03/25/24 95 kg (209 lb 6.4 oz)  05/02/23 94.1 kg (207 lb 6.4 oz)  12/09/22 91.3 kg (201 lb 3.2 oz)     Past Medical History:  Diagnosis Date   Arthritis    some in my hands (02/25/2018)   Carotid artery stenosis, asymptomatic, right    Chest pain     -Lexiscan Myoview 3/12: ? small apical lateral ischemia, EF 53%; similar to study in 2/11   CHF (congestive heart failure) (HCC)    hx   Erectile dysfunction    GERD (gastroesophageal reflux disease)    Heart murmur    hx   HOCM (hypertrophic obstructive cardiomyopathy) (HCC)     --cardiac cath 2007. nomrmal cors;  s/p myomectomy 2008;  Echo 3/12: mod to severe LVH, HCM, EF 65%, resting LVOT peak 2.21 m/s,  mild LAE, mild MR, AV mean 9;  Echo 12/13: severe LVH, EF 80%, Gr 2 diast dsyfn, mild LAE, mild RVE   HTN (hypertension)    Hyperlipidemia     Current Outpatient Medications  Medication Sig Dispense Refill   apixaban  (ELIQUIS ) 5 MG TABS tablet Take 1 tablet (5 mg total) by mouth 2 (two) times daily. 180 tablet 3   atorvastatin  (LIPITOR) 80 MG tablet TAKE 1 TABLET DAILY (CHANGE IN DOSE) 90 tablet 3   dapagliflozin propanediol (FARXIGA) 10 MG TABS tablet Take 10 mg by mouth daily.     lansoprazole  (PREVACID ) 30 MG capsule Take 1 capsule (30 mg total) by mouth daily. Please schedule a yearly follow up for further refills. Thank you  30 capsule 1   losartan  (COZAAR ) 100 MG tablet TAKE 1 TABLET DAILY 90 tablet 3   metoprolol  succinate (TOPROL -XL) 25 MG 24 hr tablet Take 1 tablet (25 mg total) by mouth 2 (two) times daily. 180 tablet 3   spironolactone  (ALDACTONE ) 25 MG tablet Take 1 tablet (25 mg total) by mouth daily. 90 tablet 3   No current facility-administered medications for this encounter.    Allergies:   Patient has no known allergies.   Social History:  The patient  reports that he has never smoked. He has never used smokeless tobacco. He reports that he does not currently use alcohol. He reports that he does not use drugs.   Family History:  The patient's family history includes Autism in his son; Bladder Cancer in his father; Kidney failure in his mother; Multiple myeloma in his mother; Non-Hodgkin's lymphoma in his father.   PHYSICAL EXAM: General:  well appearing.  No respiratory difficulty. Walked into clinic.  Neck: JVD flat.  Cor: Regular rate & rhythm. No murmurs. Lungs: clear Extremities: no edema  Neuro: alert & oriented x 3. Affect pleasant.   ECG (personally reviewed): NST 1AVB, premature supraventricular complexes. Incomplete LBBB  ASSESSMENT AND PLAN 1. Hypertrophic Obstructive Cardiomyopathy s/p septal myomectomy in January 2008 at [facility] - Doing very well. Asymptomatic. ETT in 12/15 with excellent exercise tolerance. No arrhythmias and good BP response to exercise. He has not had any high-risk features for SCD and does not meet cirteria ICD. - Echo 9/19: No obstruction. No SAM, mild MR, LA moderately dilated - Echo 11/21 EF 65-70% No SAM or outflow tract gradient  - Echo 1/25 EF 60-65%, no RWMA, no SAM on read, RV normal, no MR  - No LVOT gradient so not candidate for mavacamtan - NYHA II-IIIa, fatigue continues to be limiting factor.  - Continue Toprol  XL 25 mg bid  - Continue losartan  100 mg daily - Continue Fariga 10 mg daily - Update echo at f/u  2. Hypertension:  - Blood pressure  slightly up but reports stable readings at home.  - Continue current regimen.  3. Carotid stenosis with stroke like symptoms - s/p R CEA in 02/2018.  - Followed by [facility]  - Continue statin - Off ASA with need for AC  4. CAD, non-obstructive - cardiac CT 12/23. non-obstructive CAD in LAD and LCX (25-49%) - No s/s angina - Off ASA due to Eliquis  - Continue statin  5. 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 Denies abnormal bleeding. CBC today.   - Sleep study minimal OSA 23'  6. CKD 3a - recent SCr bump 1.2->1.5 - Followed by PCP and Nephrology - Continue Farxiga.   7. Fatigue - EKG today as above -  Would check anemia panel. Patient declined labs today as they have to be done through [facility] per medicaid. Made note for PCP to draw January along with BMET,CBC, BNP.   Follow up in 6 months with Dr. Cherrie + echo.   Beckey LITTIE Coe AGACNP-BC  03/25/24  [facility] [place] [address] [facility] [place] [address] [phone] (office)

## 2024-03-25 NOTE — Addendum Note (Signed)
 Encounter addended by: Haeven Nickle B, RN on: 03/25/2024 4:25 PM  Actions taken: Clinical Note Signed

## 2024-03-25 NOTE — Patient Instructions (Signed)
 Medication Changes:  MEDICATIONS REFILLED   Lab Work:  Labs done today, your results will be available in MyChart, we will contact you for abnormal readings.  Follow-Up in: 6 MONTHS PLEASE CALL OUR OFFICE AROUND MAY 2026 TO GET SCHEDULED FOR YOUR APPOINTMENT. PHONE NUMBER IS [phone] OPTION 2   At the [facility], you and your health needs are our priority. We have a designated team specialized in the treatment of Heart Failure. This Care Team includes your primary Heart Failure Specialized Cardiologist (physician), Advanced Practice Providers (APPs- Physician Assistants and Nurse Practitioners), and Pharmacist who all work together to provide you with the care you need, when you need it.   You may see any of the following providers on your designated Care Team at your next follow up:  Dr. Toribio Fuel Dr. Ezra Shuck Dr. Odis Brownie Greig Mosses, NP Caffie Shed, [place] [facility] [place], [place] Beckey Coe, NP Jordan Lee, NP Tinnie Redman, PharmD   Please be sure to bring in all your medications bottles to every appointment.   Need to Contact Us :  If you have any questions or concerns before your next appointment please send us  a message through [place] or call our office at [phone].    TO LEAVE A MESSAGE FOR THE NURSE SELECT OPTION 2, PLEASE LEAVE A MESSAGE INCLUDING: YOUR NAME DATE OF BIRTH CALL BACK NUMBER REASON FOR CALL**this is important as we prioritize the call backs  YOU WILL RECEIVE A CALL BACK THE SAME DAY AS LONG AS YOU CALL BEFORE 4:00 PM

## 2024-03-27 ENCOUNTER — Emergency Department (HOSPITAL_COMMUNITY)

## 2024-03-27 ENCOUNTER — Encounter (HOSPITAL_COMMUNITY): Payer: Self-pay | Admitting: *Deleted

## 2024-03-27 ENCOUNTER — Observation Stay (HOSPITAL_COMMUNITY)
Admission: EM | Admit: 2024-03-27 | Discharge: 2024-03-29 | Disposition: A | Attending: Internal Medicine | Admitting: Internal Medicine

## 2024-03-27 ENCOUNTER — Telehealth: Payer: Self-pay

## 2024-03-27 ENCOUNTER — Other Ambulatory Visit: Payer: Self-pay

## 2024-03-27 DIAGNOSIS — I6521 Occlusion and stenosis of right carotid artery: Secondary | ICD-10-CM | POA: Insufficient documentation

## 2024-03-27 DIAGNOSIS — E669 Obesity, unspecified: Secondary | ICD-10-CM | POA: Diagnosis not present

## 2024-03-27 DIAGNOSIS — I4891 Unspecified atrial fibrillation: Secondary | ICD-10-CM

## 2024-03-27 DIAGNOSIS — I4892 Unspecified atrial flutter: Secondary | ICD-10-CM | POA: Diagnosis not present

## 2024-03-27 DIAGNOSIS — I110 Hypertensive heart disease with heart failure: Secondary | ICD-10-CM | POA: Diagnosis not present

## 2024-03-27 DIAGNOSIS — I480 Paroxysmal atrial fibrillation: Secondary | ICD-10-CM

## 2024-03-27 DIAGNOSIS — Z79899 Other long term (current) drug therapy: Secondary | ICD-10-CM | POA: Insufficient documentation

## 2024-03-27 DIAGNOSIS — I421 Obstructive hypertrophic cardiomyopathy: Secondary | ICD-10-CM | POA: Diagnosis not present

## 2024-03-27 DIAGNOSIS — I5032 Chronic diastolic (congestive) heart failure: Secondary | ICD-10-CM | POA: Diagnosis not present

## 2024-03-27 DIAGNOSIS — G459 Transient cerebral ischemic attack, unspecified: Secondary | ICD-10-CM

## 2024-03-27 DIAGNOSIS — R7989 Other specified abnormal findings of blood chemistry: Secondary | ICD-10-CM | POA: Diagnosis not present

## 2024-03-27 DIAGNOSIS — R55 Syncope and collapse: Principal | ICD-10-CM | POA: Diagnosis present

## 2024-03-27 DIAGNOSIS — Z7984 Long term (current) use of oral hypoglycemic drugs: Secondary | ICD-10-CM | POA: Insufficient documentation

## 2024-03-27 DIAGNOSIS — R200 Anesthesia of skin: Secondary | ICD-10-CM | POA: Diagnosis not present

## 2024-03-27 DIAGNOSIS — E785 Hyperlipidemia, unspecified: Secondary | ICD-10-CM | POA: Insufficient documentation

## 2024-03-27 DIAGNOSIS — R531 Weakness: Secondary | ICD-10-CM | POA: Insufficient documentation

## 2024-03-27 DIAGNOSIS — Z7901 Long term (current) use of anticoagulants: Secondary | ICD-10-CM | POA: Diagnosis not present

## 2024-03-27 DIAGNOSIS — K219 Gastro-esophageal reflux disease without esophagitis: Secondary | ICD-10-CM | POA: Diagnosis not present

## 2024-03-27 DIAGNOSIS — Z6827 Body mass index (BMI) 27.0-27.9, adult: Secondary | ICD-10-CM | POA: Diagnosis not present

## 2024-03-27 LAB — BASIC METABOLIC PANEL WITH GFR
Anion gap: 7 (ref 5–15)
BUN: 15 mg/dL (ref 8–23)
CO2: 26 mmol/L (ref 22–32)
Calcium: 8.6 mg/dL — ABNORMAL LOW (ref 8.9–10.3)
Chloride: 104 mmol/L (ref 98–111)
Creatinine, Ser: 1.3 mg/dL — ABNORMAL HIGH (ref 0.61–1.24)
GFR, Estimated: >60 mL/min (ref >60)
Glucose, Bld: 115 mg/dL — ABNORMAL HIGH (ref 70–99)
Potassium: 4.4 mmol/L (ref 3.5–5.1)
Sodium: 137 mmol/L (ref 135–145)

## 2024-03-27 LAB — CBC
HCT: 46.5 % (ref 39.0–52.0)
Hemoglobin: 15.2 g/dL (ref 13.0–17.0)
MCH: 29 pg (ref 26.0–34.0)
MCHC: 32.7 g/dL (ref 30.0–36.0)
MCV: 88.6 fL (ref 80.0–100.0)
Platelets: 209 K/uL (ref 150–400)
RBC: 5.25 MIL/uL (ref 4.22–5.81)
RDW: 13.8 % (ref 11.5–15.5)
WBC: 6.6 K/uL (ref 4.0–10.5)
nRBC: 0 % (ref 0.0–0.2)

## 2024-03-27 LAB — BRAIN NATRIURETIC PEPTIDE: B Natriuretic Peptide: 91.4 pg/mL (ref 0.0–100.0)

## 2024-03-27 LAB — TROPONIN I (HIGH SENSITIVITY): Troponin I (High Sensitivity): 39 ng/L — ABNORMAL HIGH (ref <18)

## 2024-03-27 MED ORDER — IOHEXOL 350 MG/ML SOLN
75.0000 mL | Freq: Once | INTRAVENOUS | Status: AC | PRN
  Administered 2024-03-27: 75 mL via INTRAVENOUS

## 2024-03-27 NOTE — Telephone Encounter (Signed)
 Patient with known cardiomyopathy follows with our team at [place], called cardiology line and told me he had a syncopal event. He may have had some tingling sensation in his arm, but currently denies neurologic deficits. I instructed him to call EMS and come to the nearest ED to be assessed. The patient is feeling fine now, but said he would go to the ED.

## 2024-03-27 NOTE — ED Triage Notes (Signed)
 PT was laughing at a youtube video, stood up, said he couldn't breath, and fell forward onto the ottoman, blacking out briefly.  He sat for several minutes, then took the trash out.  While taking the trash out, he began experiencing L sided neck and arm numbness.  These symptoms have dissipated, but pt still feels weak.  No unilateral symptoms noted presently.

## 2024-03-27 NOTE — ED Notes (Signed)
 Currently has no chest pain

## 2024-03-27 NOTE — ED Provider Notes (Signed)
 [facility]-EMERGENCY DEPT [facility] Emergency Department Provider Note MRN:  [id number]  Arrival date & time: 03/28/24     Chief Complaint   Loss of Consciousness   History of Present Illness   ICHOLAS [name] is a 63 y.o. year-old male with a history of CHF, hokum, carotid artery stenosis presenting to the ED with chief complaint of syncope.  Patient was watching YouTube videos with his wife and was laughing and then suddenly passed out.  Woke up and recovered and overall felt okay, decided to take out the garbage.  At that time he noticed that he was having numbness to the left arm as well as blurry vision.  The symptoms lasted about 30 minutes and then resolved.  Feels normal at this time.  Review of Systems  A thorough review of systems was obtained and all systems are negative except as noted in the HPI and PMH.   Patient's Health History    Past Medical History:  Diagnosis Date   Arthritis    some in my hands (02/25/2018)   Carotid artery stenosis, asymptomatic, right    Chest pain     -Lexiscan Myoview 3/12: ? small apical lateral ischemia, EF 53%; similar to study in 2/11   CHF (congestive heart failure) (HCC)    hx   Erectile dysfunction    GERD (gastroesophageal reflux disease)    Heart murmur    hx   HOCM (hypertrophic obstructive cardiomyopathy) (HCC)     --cardiac cath 2007. nomrmal cors;  s/p myomectomy 2008;  Echo 3/12: mod to severe LVH, HCM, EF 65%, resting LVOT peak 2.21 m/s, mild LAE, mild MR, AV mean 9;  Echo 12/13: severe LVH, EF 80%, Gr 2 diast dsyfn, mild LAE, mild RVE   HTN (hypertension)    Hyperlipidemia     Past Surgical History:  Procedure Laterality Date   CARDIAC CATHETERIZATION  04/14/2006   CARDIOVERSION N/A 03/06/2022   Procedure: CARDIOVERSION;  Surgeon: Cherrie Toribio SAUNDERS, MD;  Location: [facility] ENDOSCOPY;  Service: Cardiovascular;  Laterality: N/A;   CAROTID ENDARTERECTOMY Right 02/25/2018   ENDARTERECTOMY Right 02/25/2018   Procedure:  ENDARTERECTOMY CAROTID RIGHT;  Surgeon: Oris Krystal FALCON, MD;  Location: [facility] OR;  Service: Vascular;  Laterality: Right;   MYOMECTOMY  04/2006   at [facility]    TEE WITHOUT CARDIOVERSION N/A 03/06/2022   Procedure: TRANSESOPHAGEAL ECHOCARDIOGRAM (TEE);  Surgeon: Cherrie Toribio SAUNDERS, MD;  Location: [facility] ENDOSCOPY;  Service: Cardiovascular;  Laterality: N/A;    Family History  Problem Relation Age of Onset   Multiple myeloma Mother    Kidney failure Mother    Non-Hodgkin's lymphoma Father    Bladder Cancer Father    Autism Son    Stomach cancer Neg Hx    Colon cancer Neg Hx    Esophageal cancer Neg Hx     Social History   Socioeconomic History   Marital status: Married    Spouse name: Alice   Number of children: 5   Years of education: Not on file   Highest education level: 12th grade  Occupational History   Occupation: route Probation Officer: [employer]   Occupation: route salesman  Tobacco Use   Smoking status: Never   Smokeless tobacco: Never  Vaping Use   Vaping status: Never Used  Substance and Sexual Activity   Alcohol use: Not Currently   Drug use: Never   Sexual activity: Yes  Other Topics Concern   Not on file  Social History Narrative  Patient is right-handed. He lives with his wife in a 2 story house. He drinks 2 cups of coffee a day, and until 2 weeks ago, he drank several Mt. Dew's a day. He is very active at work.   Social Drivers of Corporate Investment Banker Strain: Low Risk  (03/07/2022)   Overall Financial Resource Strain (CARDIA)    Difficulty of Paying Living Expenses: Not hard at all  Food Insecurity: No Food Insecurity (03/07/2022)   Hunger Vital Sign    Worried About Running Out of Food in the Last Year: Never true    Ran Out of Food in the Last Year: Never true  Transportation Needs: No Transportation Needs (03/07/2022)   PRAPARE - Administrator, Civil Service (Medical): No    Lack of Transportation (Non-Medical): No  Physical  Activity: Not on file  Stress: Not on file  Social Connections: Not on file  Intimate Partner Violence: Not At Risk (03/07/2022)   Humiliation, Afraid, Rape, and Kick questionnaire    Fear of Current or Ex-Partner: No    Emotionally Abused: No    Physically Abused: No    Sexually Abused: No     Physical Exam   Vitals:   03/27/24 2024 03/28/24 0118  BP: (!) 170/100 126/86  Pulse: 64 61  Resp: 18 15  Temp: 97.6 F (36.4 C)   SpO2: 98% 97%    CONSTITUTIONAL: Well-appearing, NAD NEURO/PSYCH:  Alert and oriented x 3, normal and symmetric strength and sensation, normal coordination, normal speech EYES:  eyes equal and reactive ENT/NECK:  no LAD, no JVD CARDIO: Regular rate, well-perfused, normal S1 and S2 PULM:  CTAB no wheezing or rhonchi GI/GU:  non-distended, non-tender MSK/SPINE:  No gross deformities, no edema SKIN:  no rash, atraumatic   *Additional and/or pertinent findings included in MDM below  Diagnostic and Interventional Summary    EKG Interpretation Date/Time:  Saturday March 27 2024 20:53:14 EST Ventricular Rate:  68 PR Interval:  264 QRS Duration:  116 QT Interval:  436 QTC Calculation: 463 R Axis:   19  Text Interpretation: Sinus rhythm with 1st degree A-V block Minimal voltage criteria for LVH, may be normal variant ( Cornell product ) Septal infarct , age undetermined T wave abnormality, consider lateral ischemia Abnormal ECG When compared with ECG of 25-Mar-2024 15:50, PREVIOUS ECG IS PRESENT Confirmed by Theadore Sharper [phone]) on 03/27/2024 11:20:19 PM       Labs Reviewed  BASIC METABOLIC PANEL WITH GFR - Abnormal; Notable for the following components:      Result Value   Glucose, Bld 115 (*)    Creatinine, Ser 1.30 (*)    Calcium  8.6 (*)    All other components within normal limits  TROPONIN I (HIGH SENSITIVITY) - Abnormal; Notable for the following components:   Troponin I (High Sensitivity) 39 (*)    All other components within normal  limits  CBC  BRAIN NATRIURETIC PEPTIDE  TROPONIN I (HIGH SENSITIVITY)    CT ANGIO HEAD NECK W WO CM  Final Result    DG Chest Portable 1 View  Final Result      Medications  iohexol  (OMNIPAQUE ) 350 MG/ML injection 75 mL (75 mLs Intravenous Contrast Given 03/27/24 2353)     Procedures  /  Critical Care Procedures  ED Course and Medical Decision Making  Initial Impression and Ddx Question TIA, high risk syncope, atypical presentation of ACS.  Past medical/surgical history that increases complexity of ED encounter: History  of hokum, CHF, follows with [facility]  Interpretation of Diagnostics I personally reviewed the EKG and my interpretation is as follows: Sinus rhythm  No significant blood count or electrolyte disturbance.  Troponin mildly elevated  Patient Reassessment and Ultimate Disposition/Management     Requesting neurology recommendations regarding the brief neurological deficit, plan is for hospitalist admission for high risk syncope given patient's cardiac history  Patient management required discussion with the following services or consulting groups:  Hospitalist Service and Neurology  Complexity of Problems Addressed Acute illness or injury that poses threat of life of bodily function  Additional Data Reviewed and Analyzed Further history obtained from: Further history from spouse/family member  Additional Factors Impacting ED Encounter Risk Consideration of hospitalization  Ozell HERO. Theadore, MD [facility] Emergency Medicine [facility] [email or web address]  Final Clinical Impressions(s) / ED Diagnoses     ICD-10-CM   1. Syncope, unspecified syncope type  R55       ED Discharge Orders     None        Discharge Instructions Discussed with and Provided to Patient:   Discharge Instructions   None      Theadore Ozell HERO, MD 03/28/24 [phone]

## 2024-03-28 ENCOUNTER — Observation Stay (HOSPITAL_COMMUNITY)

## 2024-03-28 DIAGNOSIS — I4892 Unspecified atrial flutter: Secondary | ICD-10-CM | POA: Diagnosis not present

## 2024-03-28 DIAGNOSIS — I421 Obstructive hypertrophic cardiomyopathy: Secondary | ICD-10-CM | POA: Diagnosis not present

## 2024-03-28 DIAGNOSIS — I480 Paroxysmal atrial fibrillation: Secondary | ICD-10-CM | POA: Diagnosis not present

## 2024-03-28 DIAGNOSIS — R55 Syncope and collapse: Secondary | ICD-10-CM

## 2024-03-28 DIAGNOSIS — E785 Hyperlipidemia, unspecified: Secondary | ICD-10-CM | POA: Diagnosis not present

## 2024-03-28 DIAGNOSIS — R7989 Other specified abnormal findings of blood chemistry: Secondary | ICD-10-CM

## 2024-03-28 LAB — CBC
HCT: 41.6 % (ref 39.0–52.0)
Hemoglobin: 14 g/dL (ref 13.0–17.0)
MCH: 29.4 pg (ref 26.0–34.0)
MCHC: 33.7 g/dL (ref 30.0–36.0)
MCV: 87.4 fL (ref 80.0–100.0)
Platelets: 210 K/uL (ref 150–400)
RBC: 4.76 MIL/uL (ref 4.22–5.81)
RDW: 13.8 % (ref 11.5–15.5)
WBC: 5.1 K/uL (ref 4.0–10.5)
nRBC: 0 % (ref 0.0–0.2)

## 2024-03-28 LAB — BASIC METABOLIC PANEL WITH GFR
Anion gap: 12 (ref 5–15)
BUN: 12 mg/dL (ref 8–23)
CO2: 24 mmol/L (ref 22–32)
Calcium: 8.4 mg/dL — ABNORMAL LOW (ref 8.9–10.3)
Chloride: 101 mmol/L (ref 98–111)
Creatinine, Ser: 1.2 mg/dL (ref 0.61–1.24)
GFR, Estimated: >60 mL/min (ref >60)
Glucose, Bld: 113 mg/dL — ABNORMAL HIGH (ref 70–99)
Potassium: 4.2 mmol/L (ref 3.5–5.1)
Sodium: 137 mmol/L (ref 135–145)

## 2024-03-28 LAB — LIPID PANEL
Cholesterol: 105 mg/dL (ref 0–200)
HDL: 28 mg/dL — ABNORMAL LOW (ref >40)
LDL Cholesterol: 64 mg/dL (ref 0–99)
Total CHOL/HDL Ratio: 3.8 ratio
Triglycerides: 64 mg/dL (ref <150)
VLDL: 13 mg/dL (ref 0–40)

## 2024-03-28 LAB — ECHOCARDIOGRAM COMPLETE BUBBLE STUDY
Area-P 1/2: 4.04 cm2
S' Lateral: 3.5 cm

## 2024-03-28 LAB — HIV ANTIBODY (ROUTINE TESTING W REFLEX): HIV Screen 4th Generation wRfx: NONREACTIVE

## 2024-03-28 LAB — PHOSPHORUS: Phosphorus: 4.2 mg/dL (ref 2.5–4.6)

## 2024-03-28 LAB — TROPONIN I (HIGH SENSITIVITY): Troponin I (High Sensitivity): 42 ng/L — ABNORMAL HIGH (ref <18)

## 2024-03-28 LAB — HEMOGLOBIN A1C
Hgb A1c MFr Bld: 6.5 % — ABNORMAL HIGH (ref 4.8–5.6)
Mean Plasma Glucose: 139.85 mg/dL

## 2024-03-28 LAB — MAGNESIUM: Magnesium: 2 mg/dL (ref 1.7–2.4)

## 2024-03-28 MED ORDER — SODIUM CHLORIDE 0.9 % IV SOLN: INTRAVENOUS | Status: AC

## 2024-03-28 MED ORDER — ACETAMINOPHEN 500 MG PO TABS: 500.0000 mg | ORAL_TABLET | Freq: Four times a day (QID) | ORAL | Status: DC | PRN

## 2024-03-28 MED ORDER — ATORVASTATIN CALCIUM 80 MG PO TABS
80.0000 mg | ORAL_TABLET | Freq: Every day | ORAL | Status: DC
  Administered 2024-03-28 – 2024-03-29 (×2): 80 mg via ORAL
  Filled 2024-03-28: qty 1
  Filled 2024-03-28: qty 2

## 2024-03-28 MED ORDER — MELATONIN 5 MG PO TABS: 5.0000 mg | ORAL_TABLET | Freq: Every evening | ORAL | Status: DC | PRN

## 2024-03-28 MED ORDER — PANTOPRAZOLE SODIUM 40 MG PO TBEC
40.0000 mg | DELAYED_RELEASE_TABLET | Freq: Every day | ORAL | Status: DC
  Administered 2024-03-28 – 2024-03-29 (×2): 40 mg via ORAL
  Filled 2024-03-28 (×2): qty 1

## 2024-03-28 MED ORDER — PROCHLORPERAZINE EDISYLATE 10 MG/2ML IJ SOLN: 5.0000 mg | Freq: Four times a day (QID) | INTRAMUSCULAR | Status: DC | PRN

## 2024-03-28 MED ORDER — PERFLUTREN LIPID MICROSPHERE
1.0000 mL | INTRAVENOUS | Status: AC | PRN
  Administered 2024-03-28: 2 mL via INTRAVENOUS

## 2024-03-28 MED ORDER — LABETALOL HCL 5 MG/ML IV SOLN: 5.0000 mg | INTRAVENOUS | Status: DC | PRN

## 2024-03-28 MED ORDER — POLYETHYLENE GLYCOL 3350 17 G PO PACK: 17.0000 g | PACK | Freq: Every day | ORAL | Status: DC | PRN

## 2024-03-28 MED ORDER — STROKE: EARLY STAGES OF RECOVERY BOOK
Freq: Once | Status: DC
  Filled 2024-03-28: qty 1

## 2024-03-28 MED ORDER — APIXABAN 5 MG PO TABS
5.0000 mg | ORAL_TABLET | Freq: Two times a day (BID) | ORAL | Status: DC
  Administered 2024-03-28 – 2024-03-29 (×4): 5 mg via ORAL
  Filled 2024-03-28 (×4): qty 1

## 2024-03-28 MED ORDER — OXYCODONE HCL 5 MG PO TABS: 5.0000 mg | ORAL_TABLET | Freq: Four times a day (QID) | ORAL | Status: DC | PRN

## 2024-03-28 NOTE — Consult Note (Signed)
 NEUROLOGY CONSULT NOTE   Date of service: March 28, 2024 Patient Name: [name] [name] MRN:  [id number] DOB:  14-Mar-1961 Chief Complaint: Transient numbness of left arm and neck Requesting Provider: Theadore Ozell HERO, MD  History of Present Illness  [name] [name] is a 63 y.o. male with a PMHx of arthritis, right carotid artery stenosis s/p CEA, CHF, erectile dysfunction, hypertrophic obstructive cardiomyopathy, paroxysmal A-fib status post direct current cardioversion on Eliquis , HTN and HLD who presents after an episode of transient numbness of his left arm and neck  Prior to onset of the numbness, he syncopized during a spell of raucous laughing precipitated by watching a funny YouTube video with his wife at home, falling onto a soft couch. He woke up after a few seconds and decided to take out trash. While walking back into the house from the dumpster, he experienced abrupt onset of LUE and left neck numbness as though asleep which subsided after a few minutes. The numbness involved the entire arm circumferentially. Does not endorse having had any neck pain with the above symptoms. Patient and wife then decided that he should be seen in the ED.   He had a series of 3 TIA-like events in 2019: The first involved left arm numbness, which resolved, followed by vision impairment, which also resolved, both occurring on the same day. The next day, he had BLE weakness and decided to be evaluated. Work up at that time included carotid ultrasound, which revealed atherosclerotic right ICA stenosis. He had a right CEA as a result. No further stroke like symptoms since 2019, until the episode that brought him in tonight.   His home medications include Eliquis  and atorvastatin .    ROS  The patient denies any other neurological symptoms, including no vision loss, facial droop, aphasia, dysarthria, face numbness, ataxia or leg weakness. Comprehensive ROS performed and other pertinent positives are  documented in the HPI   Past History   Past Medical History:  Diagnosis Date   Arthritis    some in my hands (02/25/2018)   Carotid artery stenosis, asymptomatic, right    Chest pain     -Lexiscan Myoview 3/12: ? small apical lateral ischemia, EF 53%; similar to study in 2/11   CHF (congestive heart failure) (HCC)    hx   Erectile dysfunction    GERD (gastroesophageal reflux disease)    Heart murmur    hx   HOCM (hypertrophic obstructive cardiomyopathy) (HCC)     --cardiac cath 2007. nomrmal cors;  s/p myomectomy 2008;  Echo 3/12: mod to severe LVH, HCM, EF 65%, resting LVOT peak 2.21 m/s, mild LAE, mild MR, AV mean 9;  Echo 12/13: severe LVH, EF 80%, Gr 2 diast dsyfn, mild LAE, mild RVE   HTN (hypertension)    Hyperlipidemia     Past Surgical History:  Procedure Laterality Date   CARDIAC CATHETERIZATION  04/14/2006   CARDIOVERSION N/A 03/06/2022   Procedure: CARDIOVERSION;  Surgeon: Cherrie Toribio SAUNDERS, MD;  Location: [facility] ENDOSCOPY;  Service: Cardiovascular;  Laterality: N/A;   CAROTID ENDARTERECTOMY Right 02/25/2018   ENDARTERECTOMY Right 02/25/2018   Procedure: ENDARTERECTOMY CAROTID RIGHT;  Surgeon: Oris Krystal FALCON, MD;  Location: [facility] OR;  Service: Vascular;  Laterality: Right;   MYOMECTOMY  04/2006   at [facility]    TEE WITHOUT CARDIOVERSION N/A 03/06/2022   Procedure: TRANSESOPHAGEAL ECHOCARDIOGRAM (TEE);  Surgeon: Cherrie Toribio SAUNDERS, MD;  Location: [facility] ENDOSCOPY;  Service: Cardiovascular;  Laterality: N/A;    Family History: Family History  Problem Relation Age of Onset   Multiple myeloma Mother    Kidney failure Mother    Non-Hodgkin's lymphoma Father    Bladder Cancer Father    Autism Son    Stomach cancer Neg Hx    Colon cancer Neg Hx    Esophageal cancer Neg Hx     Social History  reports that he has never smoked. He has never used smokeless tobacco. He reports that he does not currently use alcohol. He reports that he does not use drugs.  No Known  Allergies  Medications  No current facility-administered medications for this encounter.  Current Outpatient Medications:    apixaban  (ELIQUIS ) 5 MG TABS tablet, Take 1 tablet (5 mg total) by mouth 2 (two) times daily., Disp: 180 tablet, Rfl: 3   atorvastatin  (LIPITOR) 80 MG tablet, Take 1 tablet (80 mg total) by mouth daily., Disp: 90 tablet, Rfl: 3   dapagliflozin  propanediol (FARXIGA ) 10 MG TABS tablet, Take 1 tablet (10 mg total) by mouth daily., Disp: 30 tablet, Rfl: 5   lansoprazole  (PREVACID ) 30 MG capsule, Take 1 capsule (30 mg total) by mouth daily. Please schedule a yearly follow up for further refills. Thank you, Disp: 30 capsule, Rfl: 1   losartan  (COZAAR ) 100 MG tablet, Take 1 tablet (100 mg total) by mouth daily., Disp: 90 tablet, Rfl: 3   metoprolol  succinate (TOPROL -XL) 25 MG 24 hr tablet, Take 1 tablet (25 mg total) by mouth 2 (two) times daily., Disp: 180 tablet, Rfl: 3   spironolactone  (ALDACTONE ) 25 MG tablet, Take 1 tablet (25 mg total) by mouth daily., Disp: 90 tablet, Rfl: 3  Vitals   Vitals:   04-23-24 2024 04-23-2024 2038  BP: (!) 170/100   Pulse: 64   Resp: 18   Temp: 97.6 F (36.4 C)   SpO2: 98%   Weight:  95 kg  Height:  5' 3 (1.6 m)    Body mass index is 37.1 kg/m.   Physical Exam   Constitutional: Appears well-developed and well-nourished.  Psych: Affect appropriate to situation.  Eyes: No scleral injection.  HENT: No OP obstruction.  Head: Normocephalic.  Respiratory: Mild but grossly audible wheezing. Skin: WDI. No limb edema.   Neurologic Examination   Mental Status: Alert, oriented x 5, thought content appropriate.  Speech fluent without evidence of aphasia. Naming and comprehension intact. Able to follow all commands without difficulty. No dysarthria. Good insight. Pleasant and cooperative.  Cranial Nerves: II: Temporal visual fields intact with no extinction to DSS. PERRL  III,IV, VI: No ptosis. EOMI. No nystagmus.  V: Temp sensation  equal bilaterally  VII: Smile symmetric VIII: Hearing intact to voice IX,X: No hoarseness XI: Symmetric shoulder shrug XII: Midline tongue extension Motor: BUE 5/5 proximally and distally BLE 5/5 proximally and distally  No pronator drift.  Tone and bulk are normal. Sensory: Temp and light touch intact throughout, bilaterally, except for decreased temp sensation to the medial aspect of his right lower leg. No extinction to DSS.  Deep Tendon Reflexes: 2+ and symmetric bilateral brachioradialis, biceps, triceps and patellars. 1+ bilateral achilles.  Cerebellar: No ataxia with FNF or H-S bilaterally  Gait: Deferred   Labs/Imaging/Neurodiagnostic studies   CBC:  Recent Labs  Lab Apr 23, 2024 2051  WBC 6.6  HGB 15.2  HCT 46.5  MCV 88.6  PLT 209   Basic Metabolic Panel:  Lab Results  Component Value Date   NA 137 04/23/24   K 4.4 2024-04-23   CO2 26 Apr 23, 2024  GLUCOSE 115 (H) 03/27/2024   BUN 15 03/27/2024   CREATININE 1.30 (H) 03/27/2024   CALCIUM  8.6 (L) 03/27/2024   GFRNONAA >60 03/27/2024   GFRAA >60 02/26/2018   Lipid Panel:  Lab Results  Component Value Date   LDLCALC 70 03/05/2022   HgbA1c: No results found for: HGBA1C Urine Drug Screen: No results found for: LABOPIA, COCAINSCRNUR, LABBENZ, AMPHETMU, THCU, LABBARB  Alcohol Level No results found for: [facility] INR  Lab Results  Component Value Date   INR 1.2 03/06/2022   APTT  Lab Results  Component Value Date   APTT 28 02/20/2018   Recent TTE (05/19/23): 1. Left ventricular ejection fraction, by estimation, is 60 to 65%. The  left ventricle has normal function. The left ventricle has no regional  wall motion abnormalities. There is moderate concentric left ventricular  hypertrophy. Left ventricular  diastolic parameters are indeterminate.   2. Right ventricular systolic function is normal. The right ventricular  size is normal. Tricuspid regurgitation signal is inadequate for assessing   PA pressure.   3. Left atrial size was mildly dilated.   4. The mitral valve is grossly normal. No evidence of mitral valve  regurgitation. No evidence of mitral stenosis.   5. The aortic valve is tricuspid. Aortic valve regurgitation is not  visualized. No aortic stenosis is present.   6. The inferior vena cava is normal in size with greater than 50%  respiratory variability, suggesting right atrial pressure of 3 mmHg.   Prior carotid ultrasound (12/08/18): - Right Carotid: Patent right carotid endarterectomy site with no evidence  of restenosis or hyperplasia.  - Left Carotid: Velocities in the left ICA are consistent with a 1-39%  stenosis. Non-hemodynamically significant plaque noted in the CCA. The  ECA appears <50% stenosed.  Vertebrals:  Bilateral vertebral arteries demonstrate antegrade flow.  Subclavians: Normal flow hemodynamics were seen in bilateral subclavian  arteries.   ASSESSMENT  [name] [name] is a 63 y.o. male with a PMHx of arthritis, right carotid artery stenosis s/p CEA, CHF, erectile dysfunction, hypertrophic obstructive cardiomyopathy, HTN and HLD who presents after an episode of transient numbness of his left arm and neck following a syncopal spell precipitated by laughing.  - Exam reveals mild numbness of the medial aspect of the lower right leg. Neurological exam is otherwise nonfocal.  - CTA of head and neck: No evidence of acute intracranial abnormality. No large vessel occlusion or significant stenosis. Right carotid bifurcation is now widely patent presumably after carotid endarterectomy. - EKG: Sinus rhythm with 1st degree A-V block Minimal voltage criteria for LVH, may be normal variant ( Cornell product ) Septal infarct , age undetermined T wave abnormality, consider lateral ischemia Abnormal ECG  - Labs: Elevated serial troponins of 39 and 42. BUN and Cr are normal. CBC normal. Glucose 115.  - Impression: Transient left arm and neck numbness following  a syncopal spell precipitated by coughing.  - DDx for the reported numbness includes TIA. Possible mechanisms include paradoxical embolization (per literature review, a proposed mechanism for stroke following Valsalva maneuver is transiently increased intrathoracic pressure resulting in a pressure gradient that can create a transient right to left shunt shunt with resulting paradoxical embolization). Cardiac mural thrombus or valvular vegetation as potential cardioembolic sources are also possible. Watershed infarction is unlikely given that the symptoms occurred after a delay following fainting, as well as the lack of significant right carotid stenosis on CTA today.  - His fainting spell after laughing is also of  concern. Would consult Cardiology to further assess  RECOMMENDATIONS  - MRI brain - Consider obtaining a TTE or TEE with bubble study, possibly to include assessment of the hemodynamic effect of Valsalva maneuver - Cardiology consult - Cardiac telemetry - HgbA1c, fasting lipid panel - PT consult, OT consult, Speech consult - Continue atorvastatin  - Continue his home Eliquis  - Frequent neuro checks - NPO until passes stroke swallow screen\ - Permissive HTN x 24 hours ______________________________________________________________________    Bonney SHARK, Shalia Bartko, MD [facility]

## 2024-03-28 NOTE — Progress Notes (Addendum)
 Brief progress note: - Patient also admitted earlier today.  - As per H&P done on admission: [name] [name] is a 63 y.o. male with medical history significant for HOCM status post septal myomectomy in 2008 at [facility], prior syncope in 2012 with moderate to severe LVH, paroxysmal A-fib status post direct current cardioversion on Eliquis , chronic diastolic CHF, hypertension, hyperlipidemia, obesity, Right carotid artery stenosis status post CEA, who presents to the ER due to a syncopal episode at home as well as transient left arm numbness and blurry vision lasting about 30 minutes and resolving spontaneously.   The patient was having a pleasant evening with his wife, was enjoying watching YouTube videos with his wife.  He was sitting down, laughing, and suddenly felt like he could not catch his breath, then stood up.  As soon as he stood up he passed out.  Per his wife at bedside, he was unconscious for a few seconds.  He awakened quickly and was back to baseline.  About 30 minutes later, he decided to take the garbage out.  At that time, he noticed left arm numbness as well as blurry vision lasting about 30 minutes and resolving spontaneously.  He has been compliant with home Eliquis .   Has been off antiplatelets since starting Eliquis  over a year ago.  His symptoms have resolved, however the patient still feels generally weak.  No focal neurological deficits.  He presented to the ER at the recommendation of his cardiologist office, for further evaluation.   03/28/2024: Input from the neurology team and cardiology team is highly appreciated.  MRI of the brain is negative.  Continue to monitor patient.  Possible cardiac monitoring on discharge.  Likely discharge when cleared by neurology and cardiology team.  Likely undiagnosed OSA.  Consider sleep studies on discharge  Echo revealed: Left ventricular ejection fraction, by estimation, is 60 to 65%. The  left ventricle has normal function. The  left ventricle has no regional  wall motion abnormalities. There is moderate concentric left ventricular  hypertrophy. Left ventricular  diastolic parameters are indeterminate.   2. Right ventricular systolic function is normal. The right ventricular  size is normal. Tricuspid regurgitation signal is inadequate for assessing  PA pressure.   3. Left atrial size was mildly dilated.   4. The mitral valve is grossly normal. No evidence of mitral valve  regurgitation. No evidence of mitral stenosis.   5. The aortic valve is tricuspid. Aortic valve regurgitation is not  visualized. No aortic stenosis is present.   6. The inferior vena cava is normal in size with greater than 50%  respiratory variability, suggesting right atrial pressure of 3 mmHg.

## 2024-03-28 NOTE — Consult Note (Signed)
 Cardiology Consultation   Patient ID: [name] [name] MRN: [id number]; DOB: Aug 01, 1960  Admit date: 03/27/2024 Date of Consult: 03/28/2024  PCP:  Erskine Neptune, FNP   [facility] Providers Cardiologist:  Toribio Fuel, MD       Patient Profile: [name] [name] is a 63 y.o. male with a hx of HOCM s/p septal myomectomy in 2008 at [facility], mild MR, HLD, HTN, atrial flutter who is being seen 03/28/2024 for the evaluation of syncope and elevated troponin at the request of Dr. Rosario.  History of Present Illness: [name] [name] is a 63 year old male with above medical history who is followed by Dr. Fuel. Patient previously had a cardiac catheterization in 2007 that showed normal coronary arteries. Had a septal myomectomy in 2008.    Echocardiogram in 03/2012 showed severe LVH with EF 80%, LVOT peak velocity 1.33 m/s, septal thickness 21 mm, Gr 2 DD, mild LAE.  In 02/2018, patient had an episode of amaurosis fugax and underwent right carotid endarterectomy.  Admitted in 02/2022 with heart failure in the setting of atrial flutter.  Underwent TEE guided DCCV.  EF 60-65%, severe LVH, normal RV systolic function, no LVOT gradient.  Converted to normal sinus rhythm.  In 03/2022, patient had coronary CTA that showed a coronary calcium  score of 413 (88th percentile), mild nonobstructive CAD.  Echocardiogram 05/19/2023 showed EF 60-65%, no wall motion abnormalities, moderate LVH, normal RV systolic function, no significant valvular abnormalities.  Seen by advanced heart failure on 03/25/24.  Reported some fatigue which has been ongoing for a year but was otherwise doing well.  He remained on metoprolol  succinate 100 mg twice daily, losartan  100 mg daily, Farxiga  10 mg daily, statin, Eliquis .   Presented to the ED on 12/6 after syncopal episode.  He had been watching YouTube videos with his wife.  Was laughing and then suddenly passed out.  Woke up and felt well.  Decided to take out  the garbage.  Noticed that he was having numbness in his left arm and blurry vision.  Symptoms lasted 30 minutes and resolved.  In the ED, high-sensitivity troponin 39> 42.  BNP 91.4.  Chest x-ray showed no acute intrathoracic process.  CTA head showed no evidence of acute intracranial abnormality, no large vessel occlusion, right carotid bifurcation widely patent. MRI brain showed no acute intracranial abnormality.   On interview, patient tells me that he had been in his usual state of health yesterday before he passed out.  He has some baseline dyspnea on exertion and fatigue with exertion.  Denies having chest pain.  Denies having significant palpitations.  Yesterday, he was sitting with his wife watching a funny video.  He was laughing very hard and was unable to catch his breath due to his laughter.  He stood up, started to feel lightheaded like his vision was going black, and he fell forward onto his ottoman.  He lost consciousness for few seconds.  Woke up quickly and felt pretty well.  He was able to take out his garbage.  While doing this, he noticed that his vision was blurry and his left arm was weak.  Decided to come to the ED for evaluation.  He is currently feeling well.  He has been compliant with his medications and has not missed any doses of Eliquis  recently.  Tells me that he ate drink as usual yesterday.  Denies having dizziness/lightheadedness upon standing recently   Past Medical History:  Diagnosis Date   Arthritis  some in my hands (02/25/2018)   Carotid artery stenosis, asymptomatic, right    Chest pain     -Lexiscan Myoview 3/12: ? small apical lateral ischemia, EF 53%; similar to study in 2/11   CHF (congestive heart failure) (HCC)    hx   Erectile dysfunction    GERD (gastroesophageal reflux disease)    Heart murmur    hx   HOCM (hypertrophic obstructive cardiomyopathy) (HCC)     --cardiac cath 2007. nomrmal cors;  s/p myomectomy 2008;  Echo 3/12: mod to severe LVH,  HCM, EF 65%, resting LVOT peak 2.21 m/s, mild LAE, mild MR, AV mean 9;  Echo 12/13: severe LVH, EF 80%, Gr 2 diast dsyfn, mild LAE, mild RVE   HTN (hypertension)    Hyperlipidemia     Past Surgical History:  Procedure Laterality Date   CARDIAC CATHETERIZATION  04/14/2006   CARDIOVERSION N/A 03/06/2022   Procedure: CARDIOVERSION;  Surgeon: Cherrie Toribio SAUNDERS, MD;  Location: [facility] ENDOSCOPY;  Service: Cardiovascular;  Laterality: N/A;   CAROTID ENDARTERECTOMY Right 02/25/2018   ENDARTERECTOMY Right 02/25/2018   Procedure: ENDARTERECTOMY CAROTID RIGHT;  Surgeon: Oris Krystal FALCON, MD;  Location: [facility] OR;  Service: Vascular;  Laterality: Right;   MYOMECTOMY  04/2006   at [facility]    TEE WITHOUT CARDIOVERSION N/A 03/06/2022   Procedure: TRANSESOPHAGEAL ECHOCARDIOGRAM (TEE);  Surgeon: Cherrie Toribio SAUNDERS, MD;  Location: [facility] ENDOSCOPY;  Service: Cardiovascular;  Laterality: N/A;       Scheduled Meds:  [START ON 03/29/2024]  stroke: early stages of recovery book   Does not apply Once   apixaban   5 mg Oral BID   atorvastatin   80 mg Oral Daily   pantoprazole   40 mg Oral Daily   Continuous Infusions:  sodium chloride  75 mL/hr at 03/28/24 0512   PRN Meds: acetaminophen , labetalol , melatonin, oxyCODONE , polyethylene glycol, prochlorperazine   Allergies:   No Known Allergies  Social History:   Social History   Socioeconomic History   Marital status: Married    Spouse name: [name] [name]   Number of children: 5   Years of education: Not on file   Highest education level: 12th grade  Occupational History   Occupation: route Probation Officer: [employer]   Occupation: route salesman  Tobacco Use   Smoking status: Never   Smokeless tobacco: Never  Vaping Use   Vaping status: Never Used  Substance and Sexual Activity   Alcohol use: Not Currently   Drug use: Never   Sexual activity: Yes  Other Topics Concern   Not on file  Social History Narrative   Patient is right-handed. He lives with his wife  in a 2 story house. He drinks 2 cups of coffee a day, and until 2 weeks ago, he drank several Mt. Dew's a day. He is very active at work.   Social Drivers of Corporate Investment Banker Strain: Low Risk  (03/07/2022)   Overall Financial Resource Strain (CARDIA)    Difficulty of Paying Living Expenses: Not hard at all  Food Insecurity: No Food Insecurity (03/28/2024)   Hunger Vital Sign    Worried About Running Out of Food in the Last Year: Never true    Ran Out of Food in the Last Year: Never true  Transportation Needs: No Transportation Needs (03/28/2024)   PRAPARE - Administrator, Civil Service (Medical): No    Lack of Transportation (Non-Medical): No  Physical Activity: Not on file  Stress: Not on file  Social Connections: Not on file  Intimate Partner Violence: Not At Risk (03/07/2022)   Humiliation, Afraid, Rape, and Kick questionnaire    Fear of Current or Ex-Partner: No    Emotionally Abused: No    Physically Abused: No    Sexually Abused: No    Family History:   Family History  Problem Relation Age of Onset   Multiple myeloma Mother    Kidney failure Mother    Non-Hodgkin's lymphoma Father    Bladder Cancer Father    Autism Son    Stomach cancer Neg Hx    Colon cancer Neg Hx    Esophageal cancer Neg Hx      ROS:  Please see the history of present illness.  All other ROS reviewed and negative.     Physical Exam/Data: Vitals:   03/28/24 0530 03/28/24 0745 03/28/24 0805 03/28/24 0806  BP: (!) 134/106 (!) 132/99    Pulse: (!) 58 64    Resp: 14 15    Temp:   98.4 F (36.9 C)   TempSrc:   Oral   SpO2: 96% 97%  97%  Weight:      Height:       No intake or output data in the 24 hours ending 03/28/24 1052    03/27/2024    8:38 PM 03/25/2024    3:41 PM 05/02/2023    9:32 AM  Last 3 Weights  Weight (lbs) 209 lb 7 oz 209 lb 6.4 oz 207 lb 6.4 oz  Weight (kg) 95 kg 94.983 kg 94.076 kg     Body mass index is 37.1 kg/m.  General:  Well nourished, well  developed, in no acute distress. Laying in the bed with head elevated  HEENT: normal Neck: no JVD Vascular: Radial pulses 2+ bilaterally Cardiac:  normal S1, S2; RRR; no murmur   Lungs:  clear to auscultation bilaterally, no wheezing, rhonchi or rales. Normal WOB on room air  Abd: soft, nontender  Ext: no edema in BLE  Musculoskeletal:  No deformities, BUE and BLE strength normal and equal Skin: warm and dry  Neuro:  CNs 2-12 intact, no focal abnormalities noted Psych:  Normal affect   EKG:  The EKG was personally reviewed and demonstrates:  EKG showed sinus rhythm with heart rate 68 bpm, incomplete LBBB, first-degree AV block Telemetry:  Telemetry was personally reviewed and demonstrates: Normal sinus rhythm  Relevant CV Studies: Cardiac Studies & Procedures   ______________________________________________________________________________________________   STRESS TESTS  NM MYOCAR MULTI W/SPECT W 06/27/2010   ECHOCARDIOGRAM  ECHOCARDIOGRAM COMPLETE 05/19/2023  Narrative ECHOCARDIOGRAM REPORT    Patient Name:   [name] AMBROCIO [name] Date of Exam: 05/19/2023 Medical Rec #:  [id number]          Height:       63.0 in Accession #:    [id number]         Weight:       207.4 lb Date of Birth:  05-21-1960          BSA:          1.964 m Patient Age:    62 years           BP:           145/95 mmHg Patient Gender: M                  HR:           62 bpm. Exam Location:  Outpatient  Procedure: 2D Echo,  Cardiac Doppler, Color Doppler and Intracardiac Opacification Agent  Indications:    CHF I50.9  History:        Patient has prior history of Echocardiogram examinations, most recent 03/06/2022. CHF and HOCM; Risk Factors:Hypertension and Dyslipidemia.  Sonographer:    Tinnie Gosling RDCS Referring Phys: [id number] DANIEL R BENSIMHON  IMPRESSIONS   1. Left ventricular ejection fraction, by estimation, is 60 to 65%. The left ventricle has normal function. The left ventricle has no regional  wall motion abnormalities. There is moderate concentric left ventricular hypertrophy. Left ventricular diastolic parameters are indeterminate. 2. Right ventricular systolic function is normal. The right ventricular size is normal. Tricuspid regurgitation signal is inadequate for assessing PA pressure. 3. Left atrial size was mildly dilated. 4. The mitral valve is grossly normal. No evidence of mitral valve regurgitation. No evidence of mitral stenosis. 5. The aortic valve is tricuspid. Aortic valve regurgitation is not visualized. No aortic stenosis is present. 6. The inferior vena cava is normal in size with greater than 50% respiratory variability, suggesting right atrial pressure of 3 mmHg.  Comparison(s): No significant change from prior study.  FINDINGS Left Ventricle: Left ventricular ejection fraction, by estimation, is 60 to 65%. The left ventricle has normal function. The left ventricle has no regional wall motion abnormalities. The left ventricular internal cavity size was normal in size. There is moderate concentric left ventricular hypertrophy. Left ventricular diastolic parameters are indeterminate.  Right Ventricle: The right ventricular size is normal. No increase in right ventricular wall thickness. Right ventricular systolic function is normal. Tricuspid regurgitation signal is inadequate for assessing PA pressure.  Left Atrium: Left atrial size was mildly dilated.  Right Atrium: Right atrial size was normal in size.  Pericardium: There is no evidence of pericardial effusion.  Mitral Valve: The mitral valve is grossly normal. No evidence of mitral valve regurgitation. No evidence of mitral valve stenosis.  Tricuspid Valve: The tricuspid valve is grossly normal. Tricuspid valve regurgitation is not demonstrated. No evidence of tricuspid stenosis.  Aortic Valve: The aortic valve is tricuspid. Aortic valve regurgitation is not visualized. No aortic stenosis is  present.  Pulmonic Valve: The pulmonic valve was grossly normal. Pulmonic valve regurgitation is not visualized. No evidence of pulmonic stenosis.  Aorta: The aortic root and ascending aorta are structurally normal, with no evidence of dilitation.  Venous: The inferior vena cava is normal in size with greater than 50% respiratory variability, suggesting right atrial pressure of 3 mmHg.  IAS/Shunts: The atrial septum is grossly normal.   LEFT VENTRICLE PLAX 2D LVIDd:         3.90 cm   Diastology LVIDs:         2.80 cm   LV e' medial:    3.42 cm/s LV PW:         1.30 cm   LV E/e' medial:  23.3 LV IVS:        1.30 cm   LV e' lateral:   6.68 cm/s LVOT diam:     2.30 cm   LV E/e' lateral: 11.9 LV SV:         68 LV SV Index:   34 LVOT Area:     4.15 cm   RIGHT VENTRICLE            IVC RV S prime:     9.17 cm/s  IVC diam: 1.80 cm TAPSE (M-mode): 1.9 cm  LEFT ATRIUM  Index LA diam:        5.00 cm 2.55 cm/m LA Vol (A2C):   79.9 ml 40.69 ml/m LA Vol (A4C):   77.2 ml 39.31 ml/m LA Biplane Vol: 79.1 ml 40.28 ml/m AORTIC VALVE LVOT Vmax:   83.00 cm/s LVOT Vmean:  51.200 cm/s LVOT VTI:    0.163 m  AORTA Ao Root diam: 3.60 cm Ao Asc diam:  3.70 cm  MITRAL VALVE MV Area (PHT): 4.33 cm    SHUNTS MV Decel Time: 175 msec    Systemic VTI:  0.16 m MV E velocity: 79.80 cm/s  Systemic Diam: 2.30 cm MV A velocity: 99.50 cm/s MV E/A ratio:  0.80  Darryle Decent MD Electronically signed by Darryle Decent MD Signature Date/Time: 05/19/2023/3:08:27 PM    Final   TEE  ECHO TEE 03/06/2022  Narrative TRANSESOPHOGEAL ECHO REPORT    Patient Name:   [name] [name] Date of Exam: 03/06/2022 Medical Rec #:  [id number]          Height:       63.0 in Accession #:    [id number]         Weight:       185.1 lb Date of Birth:  1960-12-19          BSA:          1.871 m Patient Age:    61 years           BP:           129/86 mmHg Patient Gender: M                  HR:            84 bpm. Exam Location:  Inpatient  Procedure: Transesophageal Echo, Cardiac Doppler and Color Doppler  Indications:     Aflutter  History:         Patient has prior history of Echocardiogram examinations, most recent 03/01/2020. CHF and Cardiomyopathy; Risk Factors:Hypertension and Dyslipidemia. S/p septal myectomy in 1/08 at [facility].  Sonographer:     Annabella Fell RDCS Referring Phys:  [id number] ALMA L DIAZ Diagnosing Phys: Toribio Fuel MD  PROCEDURE: After discussion of the risks and benefits of a TEE, an informed consent was obtained from the patient. TEE procedure time was 7 minutes. The transesophogeal probe was passed without difficulty through the esophogus of the patient. Imaged were obtained with the patient in a left lateral decubitus position. Sedation performed by different physician. The patient was monitored while under deep sedation. Anesthestetic sedation was provided intravenously by Anesthesiology: 209.8mg  of Propofol , 40mg  of Lidocaine . Image quality was good. The patient's vital signs; including heart rate, blood pressure, and oxygen saturation; remained stable throughout the procedure. The patient developed no complications during the procedure.  IMPRESSIONS   1. Left ventricular ejection fraction, by estimation, is 60 to 65%. The left ventricle has normal function. There is severe left ventricular hypertrophy. 2. Right ventricular systolic function is normal. The right ventricular size is normal. 3. Left atrial size was moderately dilated. No left atrial/left atrial appendage thrombus was detected. 4. The mitral valve is normal in structure. Trivial mitral valve regurgitation. There is mild prolapse of both leaflets of the mitral valve. 5. No LVOT gradient. The aortic valve is normal in structure. Aortic valve regurgitation is not visualized. Aortic valve Vmax measures 1.14 m/s.  FINDINGS Left Ventricle: Left ventricular ejection fraction, by estimation, is 60  to 65%. The left ventricle has normal function.  The left ventricular internal cavity size was normal in size. There is severe left ventricular hypertrophy.  Right Ventricle: The right ventricular size is normal. No increase in right ventricular wall thickness. Right ventricular systolic function is normal.  Left Atrium: Left atrial size was moderately dilated. No left atrial/left atrial appendage thrombus was detected.  Right Atrium: Right atrial size was normal in size.  Pericardium: There is no evidence of pericardial effusion.  Mitral Valve: The mitral valve is normal in structure. There is mild prolapse of both leaflets of the mitral valve. Trivial mitral valve regurgitation.  Tricuspid Valve: The tricuspid valve is normal in structure. Tricuspid valve regurgitation is trivial.  Aortic Valve: No LVOT gradient. The aortic valve is normal in structure. Aortic valve regurgitation is not visualized. Aortic valve peak gradient measures 5.2 mmHg.  Pulmonic Valve: The pulmonic valve was normal in structure. Pulmonic valve regurgitation is not visualized.  Aorta: The aortic root is normal in size and structure.  IAS/Shunts: No atrial level shunt detected by color flow Doppler.  Additional Comments: Spectral Doppler performed.  AORTIC VALVE AV Vmax:      114.00 cm/s AV Peak Grad: 5.2 mmHg  Toribio Fuel MD Electronically signed by Toribio Fuel MD Signature Date/Time: 03/06/2022/1:30:27 PM    Final    CT SCANS  CT CORONARY MORPH W/CTA COR W/SCORE 03/28/2022  Addendum 03/28/2022  2:50 PM ADDENDUM REPORT: 03/28/2022 14:47  EXAM: OVER-READ INTERPRETATION  CT CHEST  The following report is a limited chest CT over-read performed by radiologist Dr. Rockey Kilts of [facility] on 03/28/2022. This over-read does not include interpretation of cardiac or coronary anatomy or pathology. The coronary CTA interpretation by the cardiologist is attached.  COMPARISON:   None Available.  FINDINGS: Vascular: Aortic atherosclerosis. No central pulmonary embolism, on this non-dedicated study.  Mediastinum/Nodes: No imaged thoracic adenopathy.  Lungs/Pleura: No pleural fluid.  Clear imaged lungs.  Upper Abdomen: Normal imaged portions of the liver, spleen, stomach.  Musculoskeletal: No acute osseous abnormality.  IMPRESSION: No acute findings in the imaged extracardiac chest.  Aortic Atherosclerosis (ICD10-I70.0).   Electronically Signed By: Rockey Kilts M.D. On: 03/28/2022 14:47  Narrative CLINICAL DATA:  Chest pain  EXAM: Cardiac/Coronary CTA  TECHNIQUE: A non-contrast, gated CT scan was obtained with axial slices of 3 mm through the heart for calcium  scoring. Calcium  scoring was performed using the Agatston method. A 120 kV prospective, gated, contrast cardiac scan was obtained. Gantry rotation speed was 250 msecs and collimation was 0.6 mm. Two sublingual nitroglycerin  tablets (0.8 mg) were given. The 3D data set was reconstructed in 5% intervals of the 35-75% of the R-R cycle. Diastolic phases were analyzed on a dedicated workstation using MPR, MIP, and VRT modes. The patient received 95 cc of contrast.  FINDINGS: Image quality: Excellent.  Noise artifact is: Limited.  Coronary Arteries:  Normal coronary origin.  Left dominance.  Left main: The left main is a large caliber vessel with a normal take off from the left coronary cusp that bifurcates to form a left anterior descending artery and a left circumflex artery. There is no plaque or stenosis.  Left anterior descending artery: The ostial and proximal LAD contains mild calcified plaque (25-49%). The mid and distal segments contains minimal non-calcified plaque (<25%). Mid LAD myocardial bridge (normal variant). D1 contains mild non-calcified plaque (25-49%).  Left circumflex artery: The LCX is dominant. The proximal segments contains mild mixed density plaque (25-49%).  The mid and distal segments contain minimal mixed  density plaque (<25%). OM1 contains mild non-calcified plaque (25-49%). OM2 is patent. The LCX terminates as a patent PDA.  Right coronary artery: The RCA is non-dominant with normal take off from the right coronary cusp. There is minimal non-calcified plaque (<25%).  Right Atrium: Right atrial size is within normal limits.  Right Ventricle: The right ventricular cavity is within normal limits.  Left Atrium: Left atrial size is normal in size with no left atrial appendage filling defect.  Left Ventricle: The ventricular cavity size is within normal limits. Severe asymmetric apical hypertrophy. S/p basal septal myectomy.  Pulmonary arteries: Normal in size without proximal filling defect.  Pulmonary veins: Normal pulmonary venous drainage.  Pericardium: Normal thickness without significant effusion or calcium  present. Abandoned epicardial pacemaker leads noted.  Cardiac valves: The aortic valve is trileaflet without significant calcification. The mitral valve is normal without significant calcification.  Aorta: Normal caliber without significant disease.  Extra-cardiac findings: See attached radiology report for non-cardiac structures.  IMPRESSION: 1. Coronary calcium  score of 413. This was 88th percentile for age-, sex, and race-matched controls.  2. Normal coronary origin with left dominance.  3. Mild non-obstructive CAD (25-49%) in the LAD/LCX.  4. Minimal CAD (<25%) in the non-dominant RCA.  5. Severe asymmetric apical hypertrophy.  S/p basal septal myectomy.  RECOMMENDATIONS: 1. Mild non-obstructive CAD (25-49%). Consider non-atherosclerotic causes of chest pain. Consider preventive therapy and risk factor modification.  Darryle Decent, MD  Electronically Signed: By: Darryle Decent M.D. On: 03/28/2022 12:25      ______________________________________________________________________________________________       Laboratory Data: High Sensitivity Troponin:   Recent Labs  Lab 03/27/24 2051 03/28/24 0138  TROPONINIHS 39* 42*     Chemistry Recent Labs  Lab 03/27/24 2051 03/28/24 0138  NA 137 137  K 4.4 4.2  CL 104 101  CO2 26 24  GLUCOSE 115* 113*  BUN 15 12  CREATININE 1.30* 1.20  CALCIUM  8.6* 8.4*  MG  --  2.0  GFRNONAA >60 >60  ANIONGAP 7 12    No results for input(s): PROT, ALBUMIN , AST, ALT, ALKPHOS, BILITOT in the last 168 hours. Lipids  Recent Labs  Lab 03/28/24 0821  CHOL 105  TRIG 64  HDL 28*  LDLCALC 64  CHOLHDL 3.8    Hematology Recent Labs  Lab 03/27/24 2051 03/28/24 0236  WBC 6.6 5.1  RBC 5.25 4.76  HGB 15.2 14.0  HCT 46.5 41.6  MCV 88.6 87.4  MCH 29.0 29.4  MCHC 32.7 33.7  RDW 13.8 13.8  PLT 209 210   Thyroid No results for input(s): TSH, FREET4 in the last 168 hours.  BNP Recent Labs  Lab 03/27/24 2051  BNP 91.4    DDimer No results for input(s): DDIMER in the last 168 hours.  Radiology/Studies:  MR BRAIN WO CONTRAST Result Date: 03/28/2024 EXAM: MRI BRAIN WITHOUT CONTRAST 03/28/2024 09:16:14 AM TECHNIQUE: Multiplanar multisequence MRI of the head/brain was performed without the administration of intravenous contrast. COMPARISON: None available. CLINICAL HISTORY: Transient ischemic attack (TIA) FINDINGS: BRAIN AND VENTRICLES: No acute infarct. No intracranial hemorrhage. No mass. No midline shift. No hydrocephalus. There is age-related atrophy and mild cerebral white matter disease. The sella is unremarkable. Normal flow voids. ORBITS: No acute abnormality. SINUSES AND MASTOIDS: There is a right concha bullosa. BONES AND SOFT TISSUES: Normal marrow signal. No acute soft tissue abnormality. IMPRESSION: 1. No acute intracranial abnormality. 2. Age-related atrophy and mild cerebral white matter disease. Electronically signed by:  Evalene Coho MD 03/28/2024 09:33  AM EST RP Workstation: [id number]   CT ANGIO HEAD NECK W WO CM Result Date: 03/28/2024 EXAM: CTA Head and Neck with Intravenous Contrast. CT Head without Contrast. CLINICAL HISTORY: Stroke/TIA, determine embolic source. TECHNIQUE: Axial CTA images of the head and neck performed with intravenous contrast. MIP reconstructed images were created and reviewed. Axial computed tomography images of the head/brain performed without intravenous contrast. Note: Per PQRS, the description of internal carotid artery percent stenosis, including 0 percent or normal exam, is based on [place] American Symptomatic Carotid Endarterectomy Trial (NASCET) criteria. Dose reduction technique was used including one or more of the following: automated exposure control, adjustment of mA and kV according to patient size, and/or iterative reconstruction. CONTRAST: 75mL Omnipaque  COMPARISON: CTA head/neck Feb 05, 2018 FINDINGS: CT HEAD: BRAIN: No acute intraparenchymal hemorrhage. No mass lesion. No CT evidence for acute territorial infarct. No midline shift or extra-axial collection. VENTRICLES: No hydrocephalus. ORBITS: The orbits are unremarkable. SINUSES AND MASTOIDS: The paranasal sinuses and mastoid air cells are clear. CTA NECK: COMMON CAROTID ARTERIES: No significant stenosis. No dissection or occlusion. INTERNAL CAROTID ARTERIES: No stenosis by NASCET criteria. No dissection or occlusion. Right carotid bifurcation is now widely patent presumably after carotid endarterectomy. VERTEBRAL ARTERIES: No significant stenosis. No dissection or occlusion. CTA HEAD: ANTERIOR CEREBRAL ARTERIES: No significant stenosis. No occlusion. No aneurysm. MIDDLE CEREBRAL ARTERIES: No significant stenosis. No occlusion. No aneurysm. POSTERIOR CEREBRAL ARTERIES: No significant stenosis. No occlusion. No aneurysm. BASILAR ARTERY: No significant stenosis. No occlusion. No aneurysm. OTHER: SOFT TISSUES: No acute finding. No  masses or lymphadenopathy. BONES: No acute osseous abnormality. IMPRESSION: 1. No evidence of acute intracranial abnormality. 2. No large vessel occlusion or significant stenosis. 3. Right carotid bifurcation is now widely patent presumably after carotid endarterectomy. Electronically signed by: Gilmore Molt MD 03/28/2024 12:13 AM EST RP Workstation: [id number]   DG Chest Portable 1 View Result Date: 03/27/2024 CLINICAL DATA:  Chest pain and syncope EXAM: PORTABLE CHEST 1 VIEW COMPARISON:  03/05/2022 FINDINGS: Single frontal view of the chest demonstrates postsurgical changes from median sternotomy. Residual epicardial pacing wires are again noted. Cardiac silhouette is unremarkable. No airspace disease, effusion, or pneumothorax. No acute bony abnormalities. IMPRESSION: 1. Stable chest, no acute intrathoracic process. Electronically Signed   By: Ozell Daring M.D.   On: 03/27/2024 21:15     Assessment and Plan:  Syncope  - Patient had an episode of syncope yesterday.  He had been laughing at a video and was unable to catch his breath due to his laughter.  He stood up and passed out.  No chest pain, palpitations.  Did feel lightheaded.  After waking up, he felt well but went on to have left arm weakness and blurred vision - Patient tells me he ate and drink as usual yesterday.  He denies having recent episodes of dizziness upon standing.  BP has been well-controlled at home, in the 120s/80s systolic.  Patient is not anemic.  - Echocardiogram pending - Telemetry with normal sinus rhythm.  Recommend continuing telemetry while admitted and discharged with 14-day live monitor. - Possible this could be vasovagal syncope - Neurology evaluating for suspected TIA   Elevated Troponin  - hsTn 39>42  - Patient denies chest pain.  EKGs without ischemic changes -Presentation not consistent with ACS - Echocardiogram pending.  Further recs pending results  HOCM  - s/p septal myomectomy in 2008  - Most  recent echo from 04/2023 showed EF 60-65%, moderate LVH, normal RV systolic function, no significant  valvular abnormalities  - Echo pending this admission  - Per neurology- recommending permissive HTN for 24 hours. OK to hold home losartan  100 mg daily, metoprolol  succinate 100 mg BID, spironolactone  25 mg daily, farxiga  10 mg daily   Persistent atrial flutter  - Patient had an episode of atrial flutter in 02/2022. Underwent DCCV  - Currently maintaining normal sinus rhythm -Holding metoprolol  as above for permissive hypertension  - Continue eliquis  5 mg BID   HLD  - Lipid panel this admission with LDL 64, HDL 28, triglycerides 64, total cholesterol 105. - Continue lipitor 80 mg daily   Otherwise per primary  - Suspected TIA, transient weakness  - GERD  - Obesity  - Right carotid artery stenosis s/p R CEA 2019   Risk Assessment/Risk Scores:   CHA2DS2-VASc Score = 4   This indicates a 4.8% annual risk of stroke. The patient's score is based upon: CHF History: 1 HTN History: 1 Diabetes History: 0 Stroke History: 2 Vascular Disease History: 0 Age Score: 0 Gender Score: 0    For questions or updates, please contact [facility] Please consult [email or web address] for contact info under   Signed, Rollo FABIENE Louder, PA-C  03/28/2024 10:52 AM

## 2024-03-28 NOTE — Evaluation (Signed)
 Physical Therapy Evaluation Patient Details Name: [name] [name] MRN: [id number] DOB: 1960-07-28 Today's Date: 03/28/2024  History of Present Illness  Admitted with syncope and collapse (associated with hard laughter), and brief sensory symptoms L UE within 20 minutes after episode;  has a past medical history of Arthritis, Carotid artery stenosis, asymptomatic, right, Chest pain, CHF (congestive heart failure) (HCC), Erectile dysfunction, GERD (gastroesophageal reflux disease), Heart murmur, HOCM (hypertrophic obstructive cardiomyopathy) (HCC), HTN (hypertension), and Hyperlipidemia.  Clinical Impression   Pt admitted with above diagnosis. Lives at home, in a two-level home with a flight of steps to reach bedroom; Prior to admission, pt was able to mange independently; Presents to PT with decr activity tolerance, mild balance deficits; Will plan for stair training tomorrrow, and anticipate he will be able to get home without difficulty;  Overall Supervision to [facility] for functional transfers and simple ambulation; Pt currently with functional limitations due to the deficits listed below (see PT Problem List). Pt will benefit from skilled PT to increase their independence and safety with mobility to allow discharge to the venue listed below.           If plan is discharge home, recommend the following: Help with stairs or ramp for entrance   Can travel by private vehicle        Equipment Recommendations None recommended by PT  Recommendations for Other Services       Functional Status Assessment Patient has had a recent decline in their functional status and demonstrates the ability to make significant improvements in function in a reasonable and predictable amount of time.     Precautions / Restrictions Precautions Precautions: None Recall of Precautions/Restrictions: Intact Restrictions Weight Bearing Restrictions Per Provider Order: No      Mobility  Bed Mobility Overal bed  mobility: Modified Independent             General bed mobility comments: incr time    Transfers Overall transfer level: Modified independent Equipment used: None               General transfer comment: from high stretcher surface    Ambulation/Gait Ambulation/Gait assistance: Contact guard assist, Supervision Gait Distance (Feet): 80 Feet Assistive device: None Gait Pattern/deviations: Step-through pattern, Decreased step length - right, Decreased step length - left, Decreased stride length Gait velocity: slowed     General Gait Details: Cues to self-monitor for activity tolerance  Stairs            Wheelchair Mobility     Tilt Bed    Modified Rankin (Stroke Patients Only) Modified Rankin (Stroke Patients Only) Pre-Morbid Rankin Score: No symptoms Modified Rankin: No symptoms     Balance Overall balance assessment: Mild deficits observed, not formally tested                                           Pertinent Vitals/Pain Pain Assessment Pain Assessment: No/denies pain    Home Living Family/patient expects to be discharged to:: Private residence Living Arrangements: Spouse/significant other;Children Available Help at Discharge: Family Type of Home: House Home Access: Stairs to enter   Secretary/administrator of Steps: 1 Alternate Level Stairs-Number of Steps: 12 Home Layout: Two level;Bed/bath upstairs Home Equipment: Cane - single point      Prior Function Prior Level of Function : Independent/Modified Independent  Extremity/Trunk Assessment   Upper Extremity Assessment Upper Extremity Assessment: Overall WFL for tasks assessed    Lower Extremity Assessment Lower Extremity Assessment: Overall WFL for tasks assessed    Cervical / Trunk Assessment Cervical / Trunk Assessment: Normal  Communication   Communication Communication: No apparent difficulties    Cognition Arousal:  Alert Behavior During Therapy: WFL for tasks assessed/performed   PT - Cognitive impairments: No apparent impairments                         Following commands: Intact       Cueing Cueing Techniques: Verbal cues     General Comments General comments (skin integrity, edema, etc.):   03/28/24 1135  Vital Signs  Patient Position (if appropriate) Orthostatic Vitals  Orthostatic Lying   BP- Lying 137/82  Pulse- Lying 67  Orthostatic Sitting  BP- Sitting (!) 137/97  Pulse- Sitting 75  Orthostatic Standing at 0 minutes  BP- Standing at 0 minutes (!) 143/96  Pulse- Standing at 0 minutes 75  Orthostatic Standing at 3 minutes  BP- Standing at 3 minutes (!) 139/97  Pulse- Standing at 3 minutes 75       Exercises     Assessment/Plan    PT Assessment Patient needs continued PT services (likely one more session acutely)  PT Problem List Decreased activity tolerance;Decreased knowledge of precautions;Cardiopulmonary status limiting activity       PT Treatment Interventions DME instruction;Gait training;Stair training;Functional mobility training;Therapeutic activities;Balance training;Therapeutic exercise;Neuromuscular re-education;Patient/family education    PT Goals (Current goals can be found in the Care Plan section)  Acute Rehab PT Goals Patient Stated Goal: be able to get home PT Goal Formulation: With patient Time For Goal Achievement: 04/04/24 Potential to Achieve Goals: Good    Frequency Min 2X/week     Co-evaluation               AM-PAC PT 6 Clicks Mobility  Outcome Measure Help needed turning from your back to your side while in a flat bed without using bedrails?: None Help needed moving from lying on your back to sitting on the side of a flat bed without using bedrails?: None Help needed moving to and from a bed to a chair (including a wheelchair)?: A Little Help needed standing up from a chair using your arms (e.g., wheelchair or bedside  chair)?: A Little Help needed to walk in hospital room?: A Little Help needed climbing 3-5 steps with a railing? : A Little 6 Click Score: 20    End of Session   Activity Tolerance: Patient tolerated treatment well Patient left: in bed;with call bell/phone within reach;with family/visitor present Nurse Communication: Mobility status PT Visit Diagnosis: Unsteadiness on feet (R26.81);Other abnormalities of gait and mobility (R26.89)    Time: 8884-8855 PT Time Calculation (min) (ACUTE ONLY): 29 min   Charges:   PT Evaluation $PT Eval Low Complexity: 1 Low PT Treatments $Gait Training: 8-22 mins PT General Charges $$ ACUTE PT VISIT: 1 Visit         Silvano Currier, PT  Acute Rehabilitation Services Office [phone] Secure Chat welcomed   Silvano VEAR Currier 03/28/2024, 5:26 PM

## 2024-03-28 NOTE — Progress Notes (Incomplete)
 STROKE TEAM PROGRESS NOTE   SUBJECTIVE (INTERVAL HISTORY) Pt reports that his primary concern has been intermittent episodes of shortness of breath which are not always correlated with exertion. He has not noticed any focal neurologic deficits, and he knows he has a significant cardiac history and is concerned that his heart is likely the reason for his symptoms.   OBJECTIVE Temp:  [97.6 F (36.4 C)-98.4 F (36.9 C)] 98.1 F (36.7 C) (12/07 1415) Pulse Rate:  [58-86] 86 (12/07 1415) Cardiac Rhythm: Heart block;Bundle branch block (12/07 1555) Resp:  [14-18] 15 (12/07 1415) BP: (126-170)/(76-106) 134/92 (12/07 1415) SpO2:  [95 %-98 %] 96 % (12/07 1415) Weight:  [95 kg] 95 kg (12/06 2038)  No results for input(s): GLUCAP in the last 168 hours. Recent Labs  Lab 03/27/24 2051 03/28/24 0138  NA 137 137  K 4.4 4.2  CL 104 101  CO2 26 24  GLUCOSE 115* 113*  BUN 15 12  CREATININE 1.30* 1.20  CALCIUM  8.6* 8.4*  MG  --  2.0  PHOS  --  4.2   No results for input(s): AST, ALT, ALKPHOS, BILITOT, PROT, ALBUMIN  in the last 168 hours. Recent Labs  Lab 03/27/24 2051 03/28/24 0236  WBC 6.6 5.1  HGB 15.2 14.0  HCT 46.5 41.6  MCV 88.6 87.4  PLT 209 210   No results for input(s): CKTOTAL, CKMB, CKMBINDEX, TROPONINI in the last 168 hours. No results for input(s): LABPROT, INR in the last 72 hours. No results for input(s): COLORURINE, LABSPEC, PHURINE, GLUCOSEU, HGBUR, BILIRUBINUR, KETONESUR, PROTEINUR, UROBILINOGEN, NITRITE, LEUKOCYTESUR in the last 72 hours.  Invalid input(s): APPERANCEUR     Component Value Date/Time   CHOL 105 03/28/2024 0821   TRIG 64 03/28/2024 0821   HDL 28 (L) 03/28/2024 0821   CHOLHDL 3.8 03/28/2024 0821   VLDL 13 03/28/2024 0821   LDLCALC 64 03/28/2024 0821   Lab Results  Component Value Date   HGBA1C 6.5 (H) 03/28/2024   No results found for: LABOPIA, COCAINSCRNUR, LABBENZ, AMPHETMU, THCU,  LABBARB  No results for input(s): ETH in the last 168 hours.  I have personally reviewed the radiological images below and agree with the radiology interpretations.  ECHOCARDIOGRAM COMPLETE BUBBLE STUDY Result Date: 03/28/2024    ECHOCARDIOGRAM REPORT   Patient Name:   [name] [name] Date of Exam: 03/28/2024 Medical Rec #:  [id number]          Height:       63.0 in Accession #:    [id number]         Weight:       209.4 lb Date of Birth:  [birth year]          BSA:          1.972 m Patient Age:    63 years           BP:           124/80 mmHg Patient Gender: M                  HR:           56 bpm. Exam Location:  Inpatient Procedure: 2D Echo, Intracardiac Opacification Agent and Saline Contrast Bubble            Study (Both Spectral and Color Flow Doppler were utilized during            procedure). Indications:    syncope  History:        Patient has  prior history of Echocardiogram examinations, most                 recent 03/06/2022. Arrythmias:Atrial Fibrillation,                 Signs/Symptoms:Syncope; Risk Factors:Hypertension.  Sonographer:    Melissa Morford RDCS (AE, PE) Referring Phys: [id number] CAROLE N HALL  Sonographer Comments: Technically difficult study due to poor echo windows. IMPRESSIONS  1. Diagnosis of HOCM with prior septal myectomy No residual LVOT gradient or significant MR . Left ventricular ejection fraction, by estimation, is 55 to 60%. The left ventricle has normal function. The left ventricle has no regional wall motion abnormalities. There is mild left ventricular hypertrophy. Left ventricular diastolic parameters were normal.  2. Right ventricular systolic function is normal. The right ventricular size is normal.  3. Left atrial size was moderately dilated.  4. The mitral valve is abnormal. Trivial mitral valve regurgitation. No evidence of mitral stenosis.  5. The aortic valve is tricuspid. Aortic valve regurgitation is not visualized. No aortic stenosis is present.  6. The  inferior vena cava is normal in size with greater than 50% respiratory variability, suggesting right atrial pressure of 3 mmHg. FINDINGS  Left Ventricle: Diagnosis of HOCM with prior septal myectomy No residual LVOT gradient or significant MR. Left ventricular ejection fraction, by estimation, is 55 to 60%. The left ventricle has normal function. The left ventricle has no regional wall motion abnormalities. Definity  contrast agent was given IV to delineate the left ventricular endocardial borders. Strain was performed and the global longitudinal strain is indeterminate. The left ventricular internal cavity size was normal in size. There is mild left ventricular hypertrophy. Left ventricular diastolic parameters were normal. Right Ventricle: The right ventricular size is normal. No increase in right ventricular wall thickness. Right ventricular systolic function is normal. Left Atrium: Left atrial size was moderately dilated. Right Atrium: Right atrial size was normal in size. Pericardium: There is no evidence of pericardial effusion. Mitral Valve: The mitral valve is abnormal. There is mild thickening of the mitral valve leaflet(s). Trivial mitral valve regurgitation. No evidence of mitral valve stenosis. Tricuspid Valve: The tricuspid valve is normal in structure. Tricuspid valve regurgitation is not demonstrated. No evidence of tricuspid stenosis. Aortic Valve: The aortic valve is tricuspid. Aortic valve regurgitation is not visualized. No aortic stenosis is present. Pulmonic Valve: The pulmonic valve was normal in structure. Pulmonic valve regurgitation is not visualized. No evidence of pulmonic stenosis. Aorta: The aortic root is normal in size and structure. Venous: The inferior vena cava is normal in size with greater than 50% respiratory variability, suggesting right atrial pressure of 3 mmHg. IAS/Shunts: No atrial level shunt detected by color flow Doppler. Agitated saline contrast was given intravenously to  evaluate for intracardiac shunting. Additional Comments: 3D was performed not requiring image post processing on an independent workstation and was indeterminate.  LEFT VENTRICLE PLAX 2D LVIDd:         4.40 cm   Diastology LVIDs:         3.50 cm   LV e' medial:    3.81 cm/s LV PW:         1.20 cm   LV E/e' medial:  21.8 LV IVS:        1.20 cm   LV e' lateral:   5.87 cm/s LVOT diam:     2.00 cm   LV E/e' lateral: 14.2 LV SV:         62  LV SV Index:   31 LVOT Area:     3.14 cm  RIGHT VENTRICLE RV S prime:     8.38 cm/s TAPSE (M-mode): 1.2 cm LEFT ATRIUM             Index        RIGHT ATRIUM           Index LA diam:        5.00 cm 2.54 cm/m   RA Area:     20.20 cm LA Vol (A2C):   64.2 ml 32.56 ml/m  RA Volume:   53.00 ml  26.88 ml/m LA Vol (A4C):   59.2 ml 30.02 ml/m LA Biplane Vol: 62.7 ml 31.80 ml/m  AORTIC VALVE LVOT Vmax:   103.00 cm/s LVOT Vmean:  79.000 cm/s LVOT VTI:    0.197 m  AORTA Ao Root diam: 2.90 cm Ao Asc diam:  3.40 cm MITRAL VALVE MV Area (PHT): 4.04 cm    SHUNTS MV Decel Time: 188 msec    Systemic VTI:  0.20 m MV E velocity: 83.20 cm/s  Systemic Diam: 2.00 cm MV A velocity: 78.50 cm/s MV E/A ratio:  1.06 Maude Emmer MD Electronically signed by Maude Emmer MD Signature Date/Time: 03/28/2024/11:54:21 AM    Final    MR BRAIN WO CONTRAST Result Date: 03/28/2024 EXAM: MRI BRAIN WITHOUT CONTRAST 03/28/2024 09:16:14 AM TECHNIQUE: Multiplanar multisequence MRI of the head/brain was performed without the administration of intravenous contrast. COMPARISON: None available. CLINICAL HISTORY: Transient ischemic attack (TIA) FINDINGS: BRAIN AND VENTRICLES: No acute infarct. No intracranial hemorrhage. No mass. No midline shift. No hydrocephalus. There is age-related atrophy and mild cerebral white matter disease. The sella is unremarkable. Normal flow voids. ORBITS: No acute abnormality. SINUSES AND MASTOIDS: There is a right concha bullosa. BONES AND SOFT TISSUES: Normal marrow signal. No acute soft  tissue abnormality. IMPRESSION: 1. No acute intracranial abnormality. 2. Age-related atrophy and mild cerebral white matter disease. Electronically signed by: Evalene Coho MD 03/28/2024 09:33 AM EST RP Workstation: [id number]   CT ANGIO HEAD NECK W WO CM Result Date: 03/28/2024 EXAM: CTA Head and Neck with Intravenous Contrast. CT Head without Contrast. CLINICAL HISTORY: Stroke/TIA, determine embolic source. TECHNIQUE: Axial CTA images of the head and neck performed with intravenous contrast. MIP reconstructed images were created and reviewed. Axial computed tomography images of the head/brain performed without intravenous contrast. Note: Per PQRS, the description of internal carotid artery percent stenosis, including 0 percent or normal exam, is based on [employer] American Symptomatic Carotid Endarterectomy Trial (NASCET) criteria. Dose reduction technique was used including one or more of the following: automated exposure control, adjustment of mA and kV according to patient size, and/or iterative reconstruction. CONTRAST: 75mL Omnipaque  COMPARISON: CTA head/neck Feb 05, 2018 FINDINGS: CT HEAD: BRAIN: No acute intraparenchymal hemorrhage. No mass lesion. No CT evidence for acute territorial infarct. No midline shift or extra-axial collection. VENTRICLES: No hydrocephalus. ORBITS: The orbits are unremarkable. SINUSES AND MASTOIDS: The paranasal sinuses and mastoid air cells are clear. CTA NECK: COMMON CAROTID ARTERIES: No significant stenosis. No dissection or occlusion. INTERNAL CAROTID ARTERIES: No stenosis by NASCET criteria. No dissection or occlusion. Right carotid bifurcation is now widely patent presumably after carotid endarterectomy. VERTEBRAL ARTERIES: No significant stenosis. No dissection or occlusion. CTA HEAD: ANTERIOR CEREBRAL ARTERIES: No significant stenosis. No occlusion. No aneurysm. MIDDLE CEREBRAL ARTERIES: No significant stenosis. No occlusion. No aneurysm. POSTERIOR CEREBRAL ARTERIES: No  significant stenosis. No occlusion. No aneurysm. BASILAR ARTERY: No significant stenosis. No  occlusion. No aneurysm. OTHER: SOFT TISSUES: No acute finding. No masses or lymphadenopathy. BONES: No acute osseous abnormality. IMPRESSION: 1. No evidence of acute intracranial abnormality. 2. No large vessel occlusion or significant stenosis. 3. Right carotid bifurcation is now widely patent presumably after carotid endarterectomy. Electronically signed by: Gilmore Molt MD 03/28/2024 12:13 AM EST RP Workstation: [id number]   DG Chest Portable 1 View Result Date: 03/27/2024 CLINICAL DATA:  Chest pain and syncope EXAM: PORTABLE CHEST 1 VIEW COMPARISON:  03/05/2022 FINDINGS: Single frontal view of the chest demonstrates postsurgical changes from median sternotomy. Residual epicardial pacing wires are again noted. Cardiac silhouette is unremarkable. No airspace disease, effusion, or pneumothorax. No acute bony abnormalities. IMPRESSION: 1. Stable chest, no acute intrathoracic process. Electronically Signed   By: Ozell Daring M.D.   On: 03/27/2024 21:15     PHYSICAL EXAM  Temp:  [97.6 F (36.4 C)-98.4 F (36.9 C)] 98.1 F (36.7 C) (12/07 1415) Pulse Rate:  [58-86] 86 (12/07 1415) Resp:  [14-18] 15 (12/07 1415) BP: (126-170)/(76-106) 134/92 (12/07 1415) SpO2:  [95 %-98 %] 96 % (12/07 1415) Weight:  [95 kg] 95 kg (12/06 2038)  General - Well nourished, well developed, in no apparent distress.  Cardiovascular - Regular rhythm and rate.  Mental Status -  Level of arousal and orientation to time, place, and person were intact. Language including expression, naming, repetition, comprehension was assessed and found intact. Attention span and concentration were normal. Recent and remote memory were intact. Fund of Knowledge was assessed and was intact.  Cranial Nerves II - XII - II - Visual field intact OU. III, IV, VI - Extraocular movements intact. V - Facial sensation intact bilaterally. VII -  Facial movement intact bilaterally. VIII - Hearing & vestibular intact bilaterally. X - Palate elevates symmetrically. XI - Chin turning & shoulder shrug intact bilaterally. XII - Tongue protrusion intact.  Motor Strength - The patient's strength was normal in all extremities and pronator drift was absent.  Bulk was normal and fasciculations were absent.   Motor Tone - Muscle tone was assessed at the neck and appendages and was normal.  Reflexes - The patient's reflexes were symmetrical in all extremities and he had no pathological reflexes.  Sensory - Light touch was assessed and was symmetrical.    Coordination - The patient had normal movements in the hands and feet with no ataxia or dysmetria.  Tremor was absent. Heel to shin WNL bilaterally  Gait and Station - deferred.   ASSESSMENT/PLAN Mr. [name] [name] is a 63 y.o. male with history of A-fib and a flutter status post DCCV on Eliquis , HOCM status post septal myomectomy in 04/2006 in [facility], status post right CEA after episode of amaurosis fugax, hypertension, hyperlipidemia admitted for syncope after laughing and then developed an episode of left neck and arm numbness. No TNK given due to symptom resolved.    Syncope History of syncope Patient had syncope after intense laughing In 06/2010 patient admitted to [facility] with syncope and atypical chest discomfort, seen by cardiology considered strain from LVH. This time patient left neck and arm numbness also likely cardiac related MRI no acute infarct CTA head and neck unremarkable 2D Echo EF 55 to 60%, no PFO LDL 64 HgbA1c 6.5 Eliquis  for VTE prophylaxis Eliquis  (apixaban ) daily prior to admission, now on home Eliquis  (apixaban ) daily.  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 Obesity, Body mass index is 37.1 kg/m.  OSA  Other Active Problems   Hospital day # 0   D. Penne Mori, DO, PGY1 Neurology Stroke Team 03/28/2024 4:34 PM  ATTENDING NOTE: I reviewed above note and agree with the assessment and plan. Pt was seen and examined.   Wife at the bedside. Pt sitting in chair, neuro intact. There are no ongoing focal neurologic deficits. Given the numbness pattern and associated SOB, exertional dizziness, highly suspect that this is a primary cardiac issue, not neurologic. We do not suspect that this was a TIA, and pt is also already anticoagulated with goal lipids and BP and no significant stroke risk factors. Recommend continued DOAC and statin as well as risk factor modification. Defer to cardiology for further recs.   Thank you for this consult. Neurology will sign off. Please reach out again if there are any further neurologic concerns.  Ary Cummins, MD PhD Stroke Neurology 03/29/2024 5:36 PM    To contact Stroke Continuity provider, please refer to [email or web address]. After hours, contact General Neurology

## 2024-03-28 NOTE — ED Notes (Signed)
 Patient transported to MRI

## 2024-03-28 NOTE — ED Notes (Signed)
 PT and support person are asking if pt can be discharged and follow up this week. Dr. Rosario notified.

## 2024-03-28 NOTE — H&P (Signed)
 History and Physical  [name] [name] FMW:[id number] DOB: 1960-07-25 DOA: 03/27/2024  Referring physician: Dr. Theadore, EDP  PCP: Erskine Neptune, FNP  Outpatient Specialists: Cardiology, Dr. Cherrie. Patient coming from: Home  Chief Complaint: Passed out, left arm numbness and blurry vision lasting 30 minutes  HPI: [name] [name] is a 63 y.o. male with medical history significant for HOCM status post septal myomectomy in 2008 at [facility], prior syncope in 2012 with moderate to severe LVH, paroxysmal A-fib status post direct current cardioversion on Eliquis , chronic diastolic CHF, hypertension, hyperlipidemia, obesity, Right carotid artery stenosis status post CEA, who presents to the ER due to a syncopal episode at home as well as transient left arm numbness and blurry vision lasting about 30 minutes and resolving spontaneously.  The patient was having a pleasant evening with his wife, was enjoying watching YouTube videos with his wife.  He was sitting down, laughing, and suddenly felt like he could not catch his breath, then stood up.  As soon as he stood up he passed out.  Per his wife at bedside, he was unconscious for a few seconds.  He awakened quickly and was back to baseline.  About 30 minutes later, he decided to take the garbage out.  At that time, he noticed left arm numbness as well as blurry vision lasting about 30 minutes and resolving spontaneously.  He has been compliant with home Eliquis .  Has been off antiplatelets since starting Eliquis  over a year ago.  His symptoms have resolved, however the patient still feels generally weak.  No focal neurological deficits.  He presented to the ER at the recommendation of his cardiologist office, for further evaluation.  Admitted by [facility], hospitalist service   ED Course: Temperature 97.6.  BP 101/48, pulse 61, respiration rate 14, O2 saturation 96% on room air.  Review of Systems: Review of systems as noted in the HPI. All  other systems reviewed and are negative.   Past Medical History:  Diagnosis Date   Arthritis    some in my hands (02/25/2018)   Carotid artery stenosis, asymptomatic, right    Chest pain     -Lexiscan Myoview 3/12: ? small apical lateral ischemia, EF 53%; similar to study in 2/11   CHF (congestive heart failure) (HCC)    hx   Erectile dysfunction    GERD (gastroesophageal reflux disease)    Heart murmur    hx   HOCM (hypertrophic obstructive cardiomyopathy) (HCC)     --cardiac cath 2007. nomrmal cors;  s/p myomectomy 2008;  Echo 3/12: mod to severe LVH, HCM, EF 65%, resting LVOT peak 2.21 m/s, mild LAE, mild MR, AV mean 9;  Echo 12/13: severe LVH, EF 80%, Gr 2 diast dsyfn, mild LAE, mild RVE   HTN (hypertension)    Hyperlipidemia    Past Surgical History:  Procedure Laterality Date   CARDIAC CATHETERIZATION  04/14/2006   CARDIOVERSION N/A 03/06/2022   Procedure: CARDIOVERSION;  Surgeon: Cherrie Toribio SAUNDERS, MD;  Location: [facility] ENDOSCOPY;  Service: Cardiovascular;  Laterality: N/A;   CAROTID ENDARTERECTOMY Right 02/25/2018   ENDARTERECTOMY Right 02/25/2018   Procedure: ENDARTERECTOMY CAROTID RIGHT;  Surgeon: Oris Krystal FALCON, MD;  Location: [facility] OR;  Service: Vascular;  Laterality: Right;   MYOMECTOMY  04/2006   at [facility]    TEE WITHOUT CARDIOVERSION N/A 03/06/2022   Procedure: TRANSESOPHAGEAL ECHOCARDIOGRAM (TEE);  Surgeon: Cherrie Toribio SAUNDERS, MD;  Location: [facility] ENDOSCOPY;  Service: Cardiovascular;  Laterality: N/A;    Social History:  reports  that he has never smoked. He has never used smokeless tobacco. He reports that he does not currently use alcohol. He reports that he does not use drugs.   No Known Allergies  Family History  Problem Relation Age of Onset   Multiple myeloma Mother    Kidney failure Mother    Non-Hodgkin's lymphoma Father    Bladder Cancer Father    Autism Son    Stomach cancer Neg Hx    Colon cancer Neg Hx    Esophageal cancer Neg Hx       Prior to  Admission medications   Medication Sig Start Date End Date Taking? Authorizing Provider  apixaban  (ELIQUIS ) 5 MG TABS tablet Take 1 tablet (5 mg total) by mouth 2 (two) times daily. 03/25/24   Hayes Beckey CROME, NP  atorvastatin  (LIPITOR) 80 MG tablet Take 1 tablet (80 mg total) by mouth daily. 03/25/24   Hayes Beckey CROME, NP  dapagliflozin  propanediol (FARXIGA ) 10 MG TABS tablet Take 1 tablet (10 mg total) by mouth daily. 03/25/24   Hayes Beckey CROME, NP  lansoprazole  (PREVACID ) 30 MG capsule Take 1 capsule (30 mg total) by mouth daily. Please schedule a yearly follow up for further refills. Thank you 01/26/24   Pyrtle, Gordy HERO, MD  losartan  (COZAAR ) 100 MG tablet Take 1 tablet (100 mg total) by mouth daily. 03/25/24   Hayes Beckey CROME, NP  metoprolol  succinate (TOPROL -XL) 25 MG 24 hr tablet Take 1 tablet (25 mg total) by mouth 2 (two) times daily. 03/25/24   Hayes Beckey CROME, NP  spironolactone  (ALDACTONE ) 25 MG tablet Take 1 tablet (25 mg total) by mouth daily. 03/25/24   Hayes Beckey CROME, NP    Physical Exam: BP 126/86   Pulse 61   Temp 97.6 F (36.4 C)   Resp 15   Ht 5' 3 (1.6 m)   Wt 95 kg   SpO2 97%   BMI 37.10 kg/m   General: 63 y.o. year-old male well developed well nourished in no acute distress.  Alert and oriented x3. Cardiovascular: Regular rate and rhythm with no rubs or gallops.  No thyromegaly or JVD noted.  No lower extremity edema. 2/4 pulses in all 4 extremities. Respiratory: Clear to auscultation with no wheezes or rales. Good inspiratory effort. Abdomen: Soft nontender nondistended with normal bowel sounds x4 quadrants. Muskuloskeletal: No cyanosis, clubbing or edema noted bilaterally Neuro: CN II-XII intact, strength, sensation, reflexes Skin: No ulcerative lesions noted or rashes Psychiatry: Judgement and insight appear normal. Mood is appropriate for condition and setting          Labs on Admission:  Basic Metabolic Panel: Recent Labs  Lab 03/27/24 2051  NA 137  K 4.4  CL 104  CO2 26   GLUCOSE 115*  BUN 15  CREATININE 1.30*  CALCIUM  8.6*   Liver Function Tests: No results for input(s): AST, ALT, ALKPHOS, BILITOT, PROT, ALBUMIN  in the last 168 hours. No results for input(s): LIPASE, AMYLASE in the last 168 hours. No results for input(s): AMMONIA in the last 168 hours. CBC: Recent Labs  Lab 03/27/24 2051  WBC 6.6  HGB 15.2  HCT 46.5  MCV 88.6  PLT 209   Cardiac Enzymes: No results for input(s): CKTOTAL, CKMB, CKMBINDEX, TROPONINI in the last 168 hours.  BNP (last 3 results) Recent Labs    03/27/24 2051  BNP 91.4    ProBNP (last 3 results) No results for input(s): PROBNP in the last 8760 hours.  CBG: No results  for input(s): GLUCAP in the last 168 hours.  Radiological Exams on Admission: CT ANGIO HEAD NECK W WO CM Result Date: 03/28/2024 EXAM: CTA Head and Neck with Intravenous Contrast. CT Head without Contrast. CLINICAL HISTORY: Stroke/TIA, determine embolic source. TECHNIQUE: Axial CTA images of the head and neck performed with intravenous contrast. MIP reconstructed images were created and reviewed. Axial computed tomography images of the head/brain performed without intravenous contrast. Note: Per PQRS, the description of internal carotid artery percent stenosis, including 0 percent or normal exam, is based on North American Symptomatic Carotid Endarterectomy Trial (NASCET) criteria. Dose reduction technique was used including one or more of the following: automated exposure control, adjustment of mA and kV according to patient size, and/or iterative reconstruction. CONTRAST: 75mL Omnipaque  COMPARISON: CTA head/neck Feb 05, 2018 FINDINGS: CT HEAD: BRAIN: No acute intraparenchymal hemorrhage. No mass lesion. No CT evidence for acute territorial infarct. No midline shift or extra-axial collection. VENTRICLES: No hydrocephalus. ORBITS: The orbits are unremarkable. SINUSES AND MASTOIDS: The paranasal sinuses and mastoid air cells  are clear. CTA NECK: COMMON CAROTID ARTERIES: No significant stenosis. No dissection or occlusion. INTERNAL CAROTID ARTERIES: No stenosis by NASCET criteria. No dissection or occlusion. Right carotid bifurcation is now widely patent presumably after carotid endarterectomy. VERTEBRAL ARTERIES: No significant stenosis. No dissection or occlusion. CTA HEAD: ANTERIOR CEREBRAL ARTERIES: No significant stenosis. No occlusion. No aneurysm. MIDDLE CEREBRAL ARTERIES: No significant stenosis. No occlusion. No aneurysm. POSTERIOR CEREBRAL ARTERIES: No significant stenosis. No occlusion. No aneurysm. BASILAR ARTERY: No significant stenosis. No occlusion. No aneurysm. OTHER: SOFT TISSUES: No acute finding. No masses or lymphadenopathy. BONES: No acute osseous abnormality. IMPRESSION: 1. No evidence of acute intracranial abnormality. 2. No large vessel occlusion or significant stenosis. 3. Right carotid bifurcation is now widely patent presumably after carotid endarterectomy. Electronically signed by: Gilmore Molt MD 03/28/2024 12:13 AM EST RP Workstation: [id number]   DG Chest Portable 1 View Result Date: 03/27/2024 CLINICAL DATA:  Chest pain and syncope EXAM: PORTABLE CHEST 1 VIEW COMPARISON:  03/05/2022 FINDINGS: Single frontal view of the chest demonstrates postsurgical changes from median sternotomy. Residual epicardial pacing wires are again noted. Cardiac silhouette is unremarkable. No airspace disease, effusion, or pneumothorax. No acute bony abnormalities. IMPRESSION: 1. Stable chest, no acute intrathoracic process. Electronically Signed   By: Ozell Daring M.D.   On: 03/27/2024 21:15    EKG: I independently viewed the EKG done and my findings are as followed: Sinus rhythm rate of 68.  First-degree AV block.  Assessment/Plan Present on Admission:  Syncope  Principal Problem:   Syncope  High risk syncope in the setting of HOCM and paroxysmal A-fib  Follow orthostatic vital signs every shift x 2 Follow  transthoracic echocardiogram Gentle IV fluid hydration NS at 75 cc/h x 1 day Cardiology consulted Fall precautions.  Left arm numbness and blurry vision Symptoms have resolved CT angio head and neck with no large vessel occlusion or significant stenosis.  Right carotid bifurcation is widely patent after carotid endarterectomy.  No evidence of acute intracranial abnormality. Neurology consulted by EDP. Further management per neurology. Ongoing TIA workup which include TTE with bubble study, MRI brain, permissive hypertension.  Suspected TIA Ongoing TIA workup as stated above.  Elevated troponin High-sensitivity troponin 39, 42 No evidence of acute ischemia on 12-lead EKG Trend troponin Follow 2D echo. Rest of management per cardiology.  Paroxysmal atrial fibrillation status post direct current cardioversion, on Eliquis . Resume home Eliquis  Monitor on telemetry.  Hypertension BPs are currently soft Hold  off BP medications. Closely monitor vital signs.  Hyperlipidemia Resume home regimen.  GERD Resume home PPI.  Chronic HFpEF Euvolemic on exam Last 2D echo done  on 05/18/2022 revealed LVEF 60 to 65% Monitor strict I's and O's and daily weight.  Obesity BMI 27 Recommend weight loss outpatient regular physical activity and healthy diet.  HOCM Status post septal myomectomy in 2008 at [facility]. Follow transthoracic echocardiogram.  Right carotid artery stenosis status post CEA Resume home Eliquis  and statin  Generalized weakness PT OT evaluation Fall precautions.   Time: 75 minutes.   DVT prophylaxis: Home Eliquis .  Code Status: Full code.  Family Communication: Patient's wife is at bedside.  Disposition Plan: Admitted to progressive care unit.  Consults called: Cardiology and neurology.  Admission status: Observation status.   Status is: Observation    [name] [name] Hurst MD [facility] Pager [phone]  If 7PM-7AM, please contact  night-coverage [email or web address] Password [email or web address]  03/28/2024, 1:55 AM

## 2024-03-29 ENCOUNTER — Observation Stay (HOSPITAL_COMMUNITY): Admit: 2024-03-29 | Discharge: 2024-03-29 | Disposition: A | Attending: Cardiology | Admitting: Cardiology

## 2024-03-29 DIAGNOSIS — I4891 Unspecified atrial fibrillation: Secondary | ICD-10-CM

## 2024-03-29 DIAGNOSIS — R55 Syncope and collapse: Secondary | ICD-10-CM

## 2024-03-29 MED ORDER — POLYETHYLENE GLYCOL 3350 17 G PO PACK: 17.0000 g | PACK | Freq: Every day | ORAL | 0 refills | Status: AC | PRN

## 2024-03-29 NOTE — ED Notes (Signed)
 Pt and spouse unaware of admission at this time. Both requesting to speak to a provider. Provider was notified. Pt remains in ED due to refusal of transport until confirmation from provider is made.

## 2024-03-29 NOTE — Discharge Summary (Incomplete)
 Physician Discharge Summary  Patient ID: [name] [name] MRN: [id number] DOB/AGE: [birth year] 62 y.o.  Admit date: 03/27/2024 Discharge date: 03/29/2024  Admission Diagnoses:  Discharge Diagnoses:  Principal Problem:   Syncope and collapse Active Problems:   Obstructive hypertrophic cardiomyopathy (HCC)   PAF (paroxysmal atrial fibrillation) (HCC)   Discharged Condition: {condition:18240}  Hospital Course: ***  Consults: {consultation:18241}  Significant Diagnostic Studies: {diagnostics:18242}  Treatments: {Tx:18249}  Discharge Exam: Blood pressure 123/86, pulse 72, temperature 98.1 F (36.7 C), resp. rate 13, height 5' 3 (1.6 m), weight 95 kg, SpO2 96%. {physical zkjf:6958869}  Disposition: Discharge disposition: 01-Home or Self Care       Discharge Instructions     Ambulatory referral to Neurology   Complete by: As directed    An appointment is requested in approximately: 2 weeks   Diet - low sodium heart healthy   Complete by: As directed    Increase activity slowly   Complete by: As directed       Allergies as of 03/29/2024   No Known Allergies      Medication List     TAKE these medications    apixaban  5 MG Tabs tablet Commonly known as: ELIQUIS  Take 1 tablet (5 mg total) by mouth 2 (two) times daily.   atorvastatin  80 MG tablet Commonly known as: LIPITOR Take 1 tablet (80 mg total) by mouth daily.   dapagliflozin  propanediol 10 MG Tabs tablet Commonly known as: Farxiga  Take 1 tablet (10 mg total) by mouth daily.   lansoprazole  30 MG capsule Commonly known as: PREVACID  Take 1 capsule (30 mg total) by mouth daily. Please schedule a yearly follow up for further refills. Thank you   losartan  100 MG tablet Commonly known as: COZAAR  Take 1 tablet (100 mg total) by mouth daily.   metoprolol  succinate 25 MG 24 hr tablet Commonly known as: TOPROL -XL Take 1 tablet (25 mg total) by mouth 2 (two) times daily.   polyethylene glycol 17 g  packet Commonly known as: MIRALAX  / GLYCOLAX  Take 17 g by mouth daily as needed for mild constipation.   spironolactone  25 MG tablet Commonly known as: ALDACTONE  Take 1 tablet (25 mg total) by mouth daily.        Follow-up Information     Erskine Neptune, FNP Follow up in 1 week(s).   Specialty: Family Medicine Contact information: [address] [place] [address] [phone]         [facility] [address] [facility] Follow up in 1 week(s).   Specialty: Cardiology Contact information: [address], [address] [place]  [address] [phone]                Signed: Leatrice LILLETTE Chapel 03/29/2024, 12:32 PM

## 2024-03-29 NOTE — Progress Notes (Signed)
 ZIO AT applied at hospital. Dr. Cherrie to read.

## 2024-03-29 NOTE — Evaluation (Signed)
 Occupational Therapy Evaluation and Discharge Patient Details Name: [name] [name] MRN: [id number] DOB: 10-Jul-1960 Today's Date: 03/29/2024   History of Present Illness   Admitted with syncope and collapse (associated with hard laughter), and brief sensory symptoms L UE within 20 minutes after episode;  has a past medical history of Arthritis, Carotid artery stenosis, asymptomatic, right, Chest pain, CHF (congestive heart failure) (HCC), Erectile dysfunction, GERD (gastroesophageal reflux disease), Heart murmur, HOCM (hypertrophic obstructive cardiomyopathy) (HCC), HTN (hypertension), and Hyperlipidemia.     Clinical Impressions Pt is functioning at his baseline. Endorses shortness of breath lately with climbing stairs. Triggered the sensation of shortness of breath with bending forward in the chair to pick up item from the floor and when he crossed his foot over the opposite knee to demonstrate ability to don shoes and socks. Pt able to pick items up from floor in standing without difficulty. Pt is mobilizing independently. No further OT needs.      If plan is discharge home, recommend the following:         Functional Status Assessment         Equipment Recommendations   None recommended by OT     Recommendations for Other Services         Precautions/Restrictions   Precautions Precautions: None Recall of Precautions/Restrictions: Intact Restrictions Weight Bearing Restrictions Per Provider Order: No     Mobility Bed Mobility                    Transfers Overall transfer level: Independent Equipment used: None                      Balance                                           ADL either performed or assessed with clinical judgement   ADL Overall ADL's : Independent                                             Vision Ability to See in Adequate Light: 0 Adequate Patient Visual Report: No  change from baseline       Perception         Praxis         Pertinent Vitals/Pain Pain Assessment Pain Assessment: No/denies pain     Extremity/Trunk Assessment Upper Extremity Assessment Upper Extremity Assessment: Overall WFL for tasks assessed   Lower Extremity Assessment Lower Extremity Assessment: Overall WFL for tasks assessed   Cervical / Trunk Assessment Cervical / Trunk Assessment: Normal   Communication Communication Communication: No apparent difficulties   Cognition Arousal: Alert Behavior During Therapy: WFL for tasks assessed/performed Cognition: No apparent impairments                               Following commands: Intact       Cueing  General Comments   Cueing Techniques: Verbal cues      Exercises     Shoulder Instructions      Home Living Family/patient expects to be discharged to:: Private residence Living Arrangements: Spouse/significant other;Children Available Help at Discharge: Family Type of Home: House Home Access: Stairs to enter  Entrance Stairs-Number of Steps: 1   Home Layout: Two level;Bed/bath upstairs Alternate Level Stairs-Number of Steps: 12 Alternate Level Stairs-Rails: Right;Left Bathroom Shower/Tub: Chief Strategy Officer: Standard     Home Equipment: Cane - single point          Prior Functioning/Environment Prior Level of Function : Independent/Modified Independent;Driving                    OT Problem List:     OT Treatment/Interventions:        OT Goals(Current goals can be found in the care plan section)       OT Frequency:       Co-evaluation              AM-PAC OT 6 Clicks Daily Activity     Outcome Measure Help from another person eating meals?: None Help from another person taking care of personal grooming?: None Help from another person toileting, which includes using toliet, bedpan, or urinal?: None Help from another person bathing  (including washing, rinsing, drying)?: None Help from another person to put on and taking off regular upper body clothing?: None Help from another person to put on and taking off regular lower body clothing?: None 6 Click Score: 24   End of Session    Activity Tolerance: Patient tolerated treatment well Patient left: in chair;with call bell/phone within reach;with family/visitor present  OT Visit Diagnosis: Dizziness and giddiness (R42)                Time: 9079-9048 OT Time Calculation (min): 31 min Charges:  OT General Charges $OT Visit: 1 Visit OT Evaluation $OT Eval Low Complexity: 1 Low  Mliss HERO, OTR/L Acute Rehabilitation Services Office: [phone]   Kennth Mliss Helling 03/29/2024, 10:36 AM

## 2024-03-29 NOTE — Progress Notes (Signed)
   Brief Progress Note   _____________________________________________________________________________________________________________  Patient Name: [name] [name] Patient DOB: [birth year] Date: @TODAY @      Data: Reviewed labs, notes, and vital signs.    Action: Attempted to downgrade.    Response:  MD responded likely to discharge today.  _____________________________________________________________________________________________________________  [facility] RN [facility] [address] Please contact us  directly via secure chat (search for [facility]) or by calling us  at [phone] [facility]).

## 2024-03-30 ENCOUNTER — Telehealth: Payer: Self-pay | Admitting: Cardiology

## 2024-03-30 NOTE — Telephone Encounter (Signed)
 Do you mind reviewing over echo?

## 2024-03-30 NOTE — Telephone Encounter (Signed)
 Shared message from Rollo Louder, PA-C with patient: Yep- Echo from his recent echocardiogram showed findings consistent with HOCM with prior septal myomectomy.  There was no residual LVOT gradient, meaning there was no obstruction of blood flow through the heart.  Ejection fraction (pump strength of the heart) was normal at 55-60%.  There were no wall motion abnormalities.  RV size and function normal.  No significant valvular abnormalities.   Overall reassuring study   Thanks KJ   Patient verbalized understanding and expressed appreciation for follow-up.

## 2024-03-30 NOTE — Telephone Encounter (Signed)
Patient is requesting a call back to review echo results.

## 2024-04-02 ENCOUNTER — Telehealth: Payer: Self-pay | Admitting: Internal Medicine

## 2024-04-02 MED ORDER — LANSOPRAZOLE 30 MG PO CPDR: 30.0000 mg | DELAYED_RELEASE_CAPSULE | Freq: Every day | ORAL | 1 refills | Status: AC

## 2024-04-02 NOTE — Telephone Encounter (Signed)
 PT needs a refillf for lansoprazole  sent to [facility]. He is completely out of the medication and an OV has been scheled for 1/22.

## 2024-04-02 NOTE — Telephone Encounter (Signed)
 Prescription sent to patient's pharmacy until scheduled appt.

## 2024-04-19 NOTE — Progress Notes (Unsigned)
 " Cardiology Office Note   Date:  04/19/2024  ID:  [name] [name], DOB Nov 01, 1960, MRN [id number] PCP: [name] Neptune, FNP  [facility] Providers Cardiologist:  [name] [name], [name] [name] { Click to update primary [name] [name],subspecialty [name] [name] or APP then REFRESH:1}    History of Present Illness [name] [name] is a 63 y.o. male with a hx of HOCM s/p septal myomectomy in 2008 at [facility], mild MR, HLD, HTN, atrial flutter. Patient is followed by [name] [name] and presents for a hospital follow up appointment   Patient previously had a cardiac catheterization in 2007 that showed normal coronary arteries. Had a septal myomectomy in 2008.     Echocardiogram in 03/2012 showed severe LVH with EF 80%, LVOT peak velocity 1.33 m/s, septal thickness 21 mm, Gr 2 DD, mild LAE.  In 02/2018, patient had an episode of amaurosis fugax and underwent right carotid endarterectomy.   Admitted in 02/2022 with heart failure in the setting of atrial flutter.  Underwent TEE guided DCCV.  EF 60-65%, severe LVH, normal RV systolic function, no LVOT gradient.  Converted to normal sinus rhythm.  In 03/2022, patient had coronary CTA that showed a coronary calcium  score of 413 (88th percentile), mild nonobstructive CAD.   Echocardiogram 05/19/2023 showed EF 60-65%, no wall motion abnormalities, moderate LVH, normal RV systolic function, no significant valvular abnormalities.  Seen by advanced heart failure on 03/25/24.  Reported some fatigue which has been ongoing for a year but was otherwise doing well.  He remained on metoprolol  succinate 100 mg twice daily, losartan  100 mg daily, Farxiga  10 mg daily, statin, Eliquis .   He was seen in the ED on 03/27/24 after he had a syncopal episode. He had been watching YouTube videos with his wife and he was laughing very hard. He stood up and he passed out. Later developed numbness in his left arm and blurry vision. In the ED, high-sensitivity troponin 39> 42.  BNP 91.4.  Chest x-ray  showed no acute intrathoracic process.  CTA head showed no evidence of acute intracranial abnormality, no large vessel occlusion, right carotid bifurcation widely patent. MRI brain showed no acute intracranial abnormality. Neurology felt TIA was unlikely and symptoms were likely cardiac. Echocardiogram 03/28/24 showed no residual LVOT gradient or significant MR, EF 55-60%, normal RV size and function. Cardiac monitor showed ***   Syncope  - Patient had an episode of syncope 12/6.  He had been laughing at a video and was unable to catch his breath due to his laughter.  He stood up and passed out.  No chest pain, palpitations.  Did feel lightheaded.  After waking up, he felt well but went on to have left arm weakness and blurred vision - Echocardiogram 03/28/24 showed no residual LVOT gradient or significant MR, EF 55-60%, normal RV size and function - Telemetry with normal sinus rhythm. Cardiac monitor showed ***  -   HOCM  - s/p septal myomectomy in 2008  - Echocardiogram 03/28/24 showed no residual LVOT gradient or significant MR, EF 55-60%, normal RV size and function - Continue losartan  100 mg daily, metoprolol  succinate 100 mg BID, spironolactone  25 mg daily, farxiga  10 mg daily    Persistent atrial flutter  - Patient had an episode of atrial flutter in 02/2022. Underwent DCCV  - Currently maintaining normal sinus rhythm - Continue metoprolol  succinate 25 mg daily  - Continue eliquis  5 mg BID    HLD  - Lipid panel this admission with LDL 64, HDL 28, triglycerides 64,  total cholesterol 105. - Continue lipitor 80 mg daily   Right carotid artery stenosis s/p R CEA 2019  - CTA head/neck 03/2024 showed no stenosis in internal carotid arteries  - Continue eliquis , lipitor   ROS: ***  Studies Reviewed      *** Risk Assessment/Calculations {Does this patient have ATRIAL FIBRILLATION?:[phone]} No BP recorded.  {Refresh Note OR Click here to enter BP  :1}***       Physical Exam VS:   There were no vitals taken for this visit.       Wt Readings from Last 3 Encounters:  03/27/24 209 lb 7 oz (95 kg)  03/25/24 209 lb 6.4 oz (95 kg)  05/02/23 207 lb 6.4 oz (94.1 kg)    GEN: Well nourished, well developed in no acute distress NECK: No JVD; No carotid bruits CARDIAC: ***RRR, no murmurs, rubs, gallops RESPIRATORY:  Clear to auscultation without rales, wheezing or rhonchi  ABDOMEN: Soft, non-tender, non-distended EXTREMITIES:  No edema; No deformity   ASSESSMENT AND PLAN ***    {Are you ordering a CV Procedure (e.g. stress test, cath, DCCV, TEE, etc)?   Press F2        :[id number]}  Dispo: ***  Signed, [name] FABIENE Louder, PA-C   "

## 2024-04-26 ENCOUNTER — Telehealth: Payer: Self-pay | Admitting: Cardiology

## 2024-04-26 NOTE — Telephone Encounter (Signed)
 No heart monitor results uploaded to chart at this time.  Left message for patient informing him of the above. Also mentioned it can take up to 2 weeks after mailing heart monitor back before we receive the results and provider reviews them. We will reach back out after they are received and reviewed.  Provided office number for callback if any questions.

## 2024-04-26 NOTE — Telephone Encounter (Signed)
 Pt had to cancel todays appt due to insurance network. Pt requesting c/b to discuss recent heart monitor results. Please advise

## 2024-04-27 NOTE — Addendum Note (Signed)
 Encounter addended by: Malvina Pina A on: 04/27/2024 7:59 AM  Actions taken: Imaging Exam ended

## 2024-04-29 ENCOUNTER — Ambulatory Visit: Admitting: Cardiology

## 2024-05-13 ENCOUNTER — Ambulatory Visit: Admitting: Nurse Practitioner

## 2024-05-25 ENCOUNTER — Other Ambulatory Visit (HOSPITAL_COMMUNITY): Payer: Self-pay | Admitting: Internal Medicine
